# Patient Record
Sex: Female | Born: 1960
Health system: Southern US, Community
[De-identification: ages and names within clinical notes are randomized; demographics above are authoritative.]

## PROBLEM LIST (undated history)

## (undated) ENCOUNTER — Emergency Department (HOSPITAL_COMMUNITY): Admission: EM | Discharge status: Absent without leave | Payer: Self-pay

## (undated) DIAGNOSIS — T4145XA Adverse effect of unspecified anesthetic, initial encounter: Secondary | ICD-10-CM

## (undated) DIAGNOSIS — D219 Benign neoplasm of connective and other soft tissue, unspecified: Secondary | ICD-10-CM

## (undated) DIAGNOSIS — S83207A Unspecified tear of unspecified meniscus, current injury, left knee, initial encounter: Secondary | ICD-10-CM

## (undated) DIAGNOSIS — M1710 Unilateral primary osteoarthritis, unspecified knee: Secondary | ICD-10-CM

## (undated) DIAGNOSIS — R200 Anesthesia of skin: Secondary | ICD-10-CM

## (undated) DIAGNOSIS — M179 Osteoarthritis of knee, unspecified: Secondary | ICD-10-CM

## (undated) HISTORY — PX: TUBAL LIGATION: SHX77

---

## 1994-10-12 HISTORY — PX: DILATION AND CURETTAGE OF UTERUS: SHX78

## 1999-09-09 ENCOUNTER — Emergency Department (HOSPITAL_COMMUNITY): Admission: EM | Admit: 1999-09-09 | Discharge: 1999-09-09 | Payer: Self-pay | Admitting: Emergency Medicine

## 1999-11-01 ENCOUNTER — Emergency Department (HOSPITAL_COMMUNITY): Admission: EM | Admit: 1999-11-01 | Discharge: 1999-11-01 | Payer: Self-pay | Admitting: Emergency Medicine

## 2000-05-11 ENCOUNTER — Ambulatory Visit (HOSPITAL_COMMUNITY): Admission: RE | Admit: 2000-05-11 | Discharge: 2000-05-11 | Payer: Self-pay | Admitting: Obstetrics and Gynecology

## 2000-05-11 ENCOUNTER — Encounter (INDEPENDENT_AMBULATORY_CARE_PROVIDER_SITE_OTHER): Payer: Self-pay

## 2000-05-11 HISTORY — PX: HYSTEROSCOPY WITH D & C: SHX1775

## 2001-04-15 ENCOUNTER — Emergency Department (HOSPITAL_COMMUNITY): Admission: EM | Admit: 2001-04-15 | Discharge: 2001-04-15 | Payer: Self-pay | Admitting: Emergency Medicine

## 2001-11-07 ENCOUNTER — Emergency Department (HOSPITAL_COMMUNITY): Admission: EM | Admit: 2001-11-07 | Discharge: 2001-11-07 | Payer: Self-pay | Admitting: *Deleted

## 2002-09-04 ENCOUNTER — Emergency Department (HOSPITAL_COMMUNITY): Admission: EM | Admit: 2002-09-04 | Discharge: 2002-09-04 | Payer: Self-pay

## 2002-09-11 ENCOUNTER — Ambulatory Visit (HOSPITAL_COMMUNITY): Admission: RE | Admit: 2002-09-11 | Discharge: 2002-09-11 | Payer: Self-pay | Admitting: Family Medicine

## 2002-09-11 ENCOUNTER — Encounter: Payer: Self-pay | Admitting: Family Medicine

## 2003-05-30 ENCOUNTER — Emergency Department (HOSPITAL_COMMUNITY): Admission: EM | Admit: 2003-05-30 | Discharge: 2003-05-30 | Payer: Self-pay | Admitting: Emergency Medicine

## 2003-05-30 ENCOUNTER — Encounter: Payer: Self-pay | Admitting: Emergency Medicine

## 2005-06-05 ENCOUNTER — Emergency Department (HOSPITAL_COMMUNITY): Admission: EM | Admit: 2005-06-05 | Discharge: 2005-06-05 | Payer: Self-pay | Admitting: Family Medicine

## 2007-09-12 ENCOUNTER — Emergency Department (HOSPITAL_COMMUNITY): Admission: EM | Admit: 2007-09-12 | Discharge: 2007-09-12 | Payer: Self-pay | Admitting: Emergency Medicine

## 2008-12-31 ENCOUNTER — Other Ambulatory Visit: Admission: RE | Admit: 2008-12-31 | Discharge: 2008-12-31 | Payer: Self-pay | Admitting: Obstetrics and Gynecology

## 2009-01-01 ENCOUNTER — Ambulatory Visit (HOSPITAL_COMMUNITY): Admission: RE | Admit: 2009-01-01 | Discharge: 2009-01-01 | Payer: Self-pay | Admitting: Obstetrics & Gynecology

## 2009-01-23 ENCOUNTER — Encounter: Admission: RE | Admit: 2009-01-23 | Discharge: 2009-01-23 | Payer: Self-pay | Admitting: Obstetrics & Gynecology

## 2009-02-09 DIAGNOSIS — T8859XA Other complications of anesthesia, initial encounter: Secondary | ICD-10-CM

## 2009-02-09 HISTORY — DX: Other complications of anesthesia, initial encounter: T88.59XA

## 2009-02-26 ENCOUNTER — Encounter: Payer: Self-pay | Admitting: Obstetrics & Gynecology

## 2009-02-26 ENCOUNTER — Ambulatory Visit (HOSPITAL_COMMUNITY): Admission: RE | Admit: 2009-02-26 | Discharge: 2009-02-27 | Payer: Self-pay | Admitting: Obstetrics & Gynecology

## 2009-02-26 HISTORY — PX: ROBOTIC ASSISTED TOTAL HYSTERECTOMY: SHX6085

## 2009-09-03 ENCOUNTER — Emergency Department (HOSPITAL_COMMUNITY): Admission: EM | Admit: 2009-09-03 | Discharge: 2009-09-03 | Payer: Self-pay | Admitting: Emergency Medicine

## 2009-09-03 ENCOUNTER — Emergency Department (HOSPITAL_COMMUNITY): Admission: EM | Admit: 2009-09-03 | Discharge: 2009-09-03 | Payer: Self-pay | Admitting: Family Medicine

## 2009-09-19 ENCOUNTER — Encounter: Admission: RE | Admit: 2009-09-19 | Discharge: 2009-10-09 | Payer: Self-pay | Admitting: Sports Medicine

## 2010-04-07 ENCOUNTER — Encounter: Admission: RE | Admit: 2010-04-07 | Discharge: 2010-04-07 | Payer: Self-pay | Admitting: Obstetrics & Gynecology

## 2010-05-26 ENCOUNTER — Encounter (INDEPENDENT_AMBULATORY_CARE_PROVIDER_SITE_OTHER): Payer: Self-pay | Admitting: Orthopedic Surgery

## 2010-05-26 ENCOUNTER — Inpatient Hospital Stay (HOSPITAL_COMMUNITY): Admission: RE | Admit: 2010-05-26 | Discharge: 2010-05-28 | Payer: Self-pay | Admitting: Orthopedic Surgery

## 2010-05-26 HISTORY — PX: TOTAL KNEE ARTHROPLASTY: SHX125

## 2010-06-25 ENCOUNTER — Encounter
Admission: RE | Admit: 2010-06-25 | Discharge: 2010-08-05 | Payer: Self-pay | Source: Home / Self Care | Admitting: Orthopedic Surgery

## 2010-11-02 ENCOUNTER — Encounter: Payer: Self-pay | Admitting: Obstetrics & Gynecology

## 2010-12-26 LAB — DIFFERENTIAL
Eosinophils Relative: 1 % (ref 0–5)
Lymphocytes Relative: 31 % (ref 12–46)
Monocytes Relative: 7 % (ref 3–12)
Neutro Abs: 2.9 10*3/uL (ref 1.7–7.7)
Neutrophils Relative %: 61 % (ref 43–77)

## 2010-12-26 LAB — COMPREHENSIVE METABOLIC PANEL
Albumin: 4.1 g/dL (ref 3.5–5.2)
Alkaline Phosphatase: 88 U/L (ref 39–117)
CO2: 25 mEq/L (ref 19–32)
GFR calc non Af Amer: >60 mL/min (ref >60)
Potassium: 3.9 mEq/L (ref 3.5–5.1)
Sodium: 141 mEq/L (ref 135–145)
Total Bilirubin: 0.6 mg/dL (ref 0.3–1.2)

## 2010-12-26 LAB — URINALYSIS, ROUTINE W REFLEX MICROSCOPIC
Glucose, UA: NEGATIVE mg/dL
Protein, ur: NEGATIVE mg/dL
Specific Gravity, Urine: 1.024 (ref 1.005–1.030)
pH: 6 (ref 5.0–8.0)

## 2010-12-26 LAB — CBC
HCT: 32.5 % — ABNORMAL LOW (ref 36.0–46.0)
Hemoglobin: 10.5 g/dL — ABNORMAL LOW (ref 12.0–15.0)
Hemoglobin: 11 g/dL — ABNORMAL LOW (ref 12.0–15.0)
MCH: 34.5 pg — ABNORMAL HIGH (ref 26.0–34.0)
MCH: 34.5 pg — ABNORMAL HIGH (ref 26.0–34.0)
MCH: 34.4 pg — ABNORMAL HIGH (ref 26.0–34.0)
MCHC: 33.8 g/dL (ref 30.0–36.0)
MCV: 102.3 fL — ABNORMAL HIGH (ref 78.0–100.0)
Platelets: 128 10*3/uL — ABNORMAL LOW (ref 150–400)
RBC: 3.19 MIL/uL — ABNORMAL LOW (ref 3.87–5.11)
RDW: 12.7 % (ref 11.5–15.5)
RDW: 12.5 % (ref 11.5–15.5)
WBC: 7.3 10*3/uL (ref 4.0–10.5)
WBC: 7.7 10*3/uL (ref 4.0–10.5)

## 2010-12-26 LAB — BASIC METABOLIC PANEL
CO2: 28 mEq/L (ref 19–32)
Calcium: 8.6 mg/dL (ref 8.4–10.5)
Chloride: 101 mEq/L (ref 96–112)
Creatinine, Ser: 0.58 mg/dL (ref 0.4–1.2)
GFR calc Af Amer: >60 mL/min (ref >60)
GFR calc non Af Amer: >60 mL/min (ref >60)
Glucose, Bld: 96 mg/dL (ref 70–99)
Sodium: 136 mEq/L (ref 135–145)
Sodium: 140 mEq/L (ref 135–145)

## 2010-12-26 LAB — TYPE AND SCREEN: Antibody Screen: NEGATIVE

## 2010-12-26 LAB — PROTIME-INR: INR: 1.01 (ref 0.00–1.49)

## 2010-12-26 LAB — APTT: aPTT: 32 seconds (ref 24–37)

## 2010-12-26 LAB — SURGICAL PCR SCREEN: Staphylococcus aureus: NEGATIVE

## 2011-01-20 LAB — BASIC METABOLIC PANEL
CO2: 25 mEq/L (ref 19–32)
Calcium: 8.5 mg/dL (ref 8.4–10.5)
GFR calc Af Amer: >60 mL/min (ref >60)
Potassium: 3.5 mEq/L (ref 3.5–5.1)
Potassium: 3.9 mEq/L (ref 3.5–5.1)
Sodium: 139 mEq/L (ref 135–145)

## 2011-01-20 LAB — URINALYSIS, ROUTINE W REFLEX MICROSCOPIC
Hgb urine dipstick: NEGATIVE
Nitrite: NEGATIVE
Specific Gravity, Urine: 1.008 (ref 1.005–1.030)

## 2011-01-20 LAB — CBC
HCT: 27.1 % — ABNORMAL LOW (ref 36.0–46.0)
Hemoglobin: 9.1 g/dL — ABNORMAL LOW (ref 12.0–15.0)
MCHC: 33.8 g/dL (ref 30.0–36.0)
MCV: 89.7 fL (ref 78.0–100.0)
MCV: 90.9 fL (ref 78.0–100.0)
Platelets: 166 K/uL (ref 150–400)
Platelets: 205 10*3/uL (ref 150–400)
RBC: 2.98 MIL/uL — ABNORMAL LOW (ref 3.87–5.11)
RDW: 15.8 % — ABNORMAL HIGH (ref 11.5–15.5)
WBC: 7.7 K/uL (ref 4.0–10.5)

## 2011-01-20 LAB — PREGNANCY, URINE: Preg Test, Ur: NEGATIVE

## 2011-01-20 LAB — TSH: TSH: 0.358 u[IU]/mL (ref 0.350–4.500)

## 2011-01-20 LAB — TYPE AND SCREEN: Antibody Screen: NEGATIVE

## 2011-01-20 LAB — ABO/RH: ABO/RH(D): O POS

## 2011-02-24 NOTE — Op Note (Signed)
[name], [name]                  ACCOUNT NO.:  [id number]   MEDICAL RECORD NO.:  [id number]          PATIENT TYPE:  OIB   LOCATION:  0098                         FACILITY:  [facility]   PHYSICIAN:  M. Leda Quail, MD  DATE OF BIRTH:  [birth year]   DATE OF PROCEDURE:  02/26/2009  DATE OF DISCHARGE:                               OPERATIVE REPORT   PREOPERATIVE DIAGNOSES:  16. A 50 year old gravida 2, para 2, married single [place] American      female with menorrhagia.  2. Anemia.  3. Fibroid uterus.  4. Adenomyosis on ultrasound.  5. Cesarean section x1.  6. History of bilateral tubal ligation.  7. Smoker.   POSTOPERATIVE DIAGNOSES:  69. A 50 year old gravida 2, para 2, married single [place] American      female with menorrhagia.  2. Anemia.  3. Fibroid uterus.  4. Adenomyosis on ultrasound.  5. Cesarean section x1.  6. History of bilateral tubal ligation.  7. Smoker.   PROCEDURE:  TLH using Chemical engineer system.   SURGEON:  M. Leda Quail, M.D.   ASSISTANT:  Edwena Felty. Romine, M.D.   ANESTHESIA:  General endotracheal, Bruce J. Council Mechanic, M.D. oversaw the  case.   FINDINGS:  Globular uterus about 10 weeks in size consistent with  fibroids and adenomyosis.  Small left paratubal cyst.  The patient had a  4-cm simple-appearing cyst on the right ovary which was absent.  She did  have some adhesions of the ovary to the right sidewall.  Also, the  patient has a simple approximately 2-cm cyst on the left sidewall.   SPECIMENS:  Uterus and cervix sent to pathology.   ESTIMATED BLOOD LOSS:  100 mL.   URINE OUTPUT:  200 mL of clear urine.   DRAINS:  Foley catheter.   FLUIDS:  1300 mL of LR.   COMPLICATIONS:  None.   INDICATIONS:  Ms. [name] is a very nice 50 year old G2, P2 African  American female with a history of significant menorrhagia and anemia.  This is interfering with work and she misses work almost every month  because of the amount of bleeding she has.   She had ruined clothes as  well as sheets and mattress because of the volume of bleeding.  She is a  smoker and is not a candidate for birth control pills.  We discussed  other options including an ablation, which I think would be a bad option  for this patient because of her adenomyosis, and also an IUD, which she  declined.  Risks and benefits have been explained to the patient and  this is all documented in her office chart.  She is ready to proceed.   PROCEDURE:  The patient is taken to the operating room and she is placed  in a supine position.  General endotracheal anesthesia is administered  by the anesthesia staff without difficulty.  The patient's arms were  tucked straight to her side, with arms being padded and fingers being  placed loosely.  Attention was paid to where the [facility] stirrups are  connected to the table  and the placement of her hands.  Legs were then  positioned in the low lithotomy position in [place] stirrups.  The legs  were then lifted.  The abdomen, perineum and her thighs and vagina are  prepped in normal sterile fashion.  The patient is then draped in a  normal sterile fashion.  Attention is turned toward the vagina.  A heavy  weighted speculum was placed in the posterior vaginal opening.  A Deaver  retractor was used to visualize the cervix and the anterior lip of the  cervix was grasped with a single-tooth tenaculum.  The cervix is dilated  to a #21 using Pratt dilators and sounded to 8 cm.  A #8 attachment tip  for the RUMI uterine manipulator was obtained.  This is attached to the  RUMI uterine manipulator as well as the vaginal occlusive device.  The  uterine manipulator is passed into the cervical canal to the fundus of  the uterus.  A medium KOH ring is also attached to the RUMI uterine  manipulator and placed around the cervix.  Normal saline 5 mL are used  to inflate the bulb.  Tenaculum was removed from the cervix.  Good  placement around the cervix  was noted.  There was good manipulation of  the uterus.  The heavy weighted speculum is removed from the vagina.  The vaginal occlusive device was inflated with 60 mL of air.  The legs  were then flexed to the lowest possible position in low lithotomy  position.  Sterile gloves and gown are changed and attention turned to  the abdomen.  Three to 4 cm above the umbilicus skin is anesthetized.  Quarter percent Marcaine is used to anesthetize the skin.  Using a #11  blade, a 10-mm skin incision was made.  Subcutaneous fat tissue was  dissected with a hemostat.  The fascia is identified and visualized  using S retractors.  Kocher clamps were applied to the fascia and the  fascia is elevated.  Using curved Mayo scissors, an incision is made in  the fascia.  By using the operator's index finger, the peritoneum was  popped through.  There were some adhesions of the omentum to the  anterior abdominal wall below the incision, but otherwise the abdomen  feels clear.  A pursestring suture of #2-0 Vicryl was placed at the  umbilicus.  Then a #10 Roseanne Reno is passed through the umbilicus.  It is  attached to the skin using the suture ends from the pursestring suture.  CO2 gas was then used to achieve pneumoperitoneum under low flow.  The  laparoscope for the robot is used to then visualize the pelvis.  The  table is placed on the floor.  The patient was placed in deep  Trendelenburg.  She tolerated this very well.  Pelvis was surveyed.  The  scope could be passed easily around the omental adhesion.  Decision was  made to go ahead and proceed with the case as planned.  Port sites for  the first and second arm of the [employer] robot were identified.  These  were placed about a hand breadth away from the umbilical incision.  The  areas were marked and placement site for the assistant port was then  made about 10 cm to the right and inferiorly from the first arm port.  The third arm of the robot was not used  for this case.  The skin was  anesthetized in all these locations using 0.25%  Marcaine and then using  #11 blade the skin was nicked in all 3 locations.  Non-disposable ports  were placed in the higher ports and a 12-mm disposable port was placed  in the right inferior port.  At this point the robot was docked from the  side without difficulty.  Monopolar scissors were used in the right hand  and bipolar cautery was used in the left hand.   With an OR nurse between the legs manipulating the uterus, pelvis was  surveyed.  There was a simple cyst on the left ovary, a paratubal cyst  on the left tube, and the right ovary was adhesed slightly to the right  sidewall.  Ureter peristalsis was noted bilaterally.  Then attention was  initially turned to the left side.  The left ovarian cyst was opened and  drained without difficulty.  Then the left utero-ovarian pedicle was  serially clamped, cauterized and transected using bipolar cautery and  the sound indicator to ensure adequate cauterization.  Then the left  round ligament was serially clamped, cauterized and transected until it  was opened.  Then the anterior and posterior leaf of the broad ligament  was opened with monopolar scissors.  The peritoneum was incised  inferiorly to the level of the internal os of the cervix creating the  beginning of the bladder flap.  The left uterine artery was  skeletonized.  Attention is turned to the right side.  The right utero-  ovarian pedicle was clamped, cauterized and incised, and the right round  ligament was clamped, cauterized and incised.  Then the anterior and  posterior leaf of the broad ligament was opened anteriorly and  posteriorly with the  monopolar scissors.  The peritoneum was connected  inferiorly to the peritoneal incision that had previously been made.  The peritoneum was then elevated and the pubovesical cervical fascia was  identified.  This filmy layer was taken down sharply,  cauterization used  only when small vessels were noted.  Glistening white tissue of the  cervix was noted and the level of the KOH ring was identified.  The  pubovesical cervical fascia was dissected down until it was below the  level of the KOH ring.  Then attention was turned to the right uterine  artery.  This was skeletonized and then serially clamped, cauterized and  incised starting at the level of the internal os and then coming down  the cervix until the pedicle was beneath the KOH ring.  Attention was  turned to the left side and the uterine artery was serially clamped,  cauterized and incised, again until it was below the level of the KOH  ring.  Then the colpotomy was started anteriorly on the KOH ring using  monopolar scissors.  This was brought around the cervix to the right  side, then posteriorly, and then completing the colpotomy on the left.  Once the cervix and uterus were completely freed from the vaginal  mucosa, the vaginal occlusive device was deflated and the uterus was  brought through the incision.  It was used to keep the vagina occluded  during the closure of the vaginal cuff.  Then using a 0 Vicryl, 4 figure-  of-eight sutures were used to close the vaginal cuff, attaching the  anterior peritoneum, anterior vaginal mucosa, posterior vaginal mucosa  and posterior peritoneum.  Once this was completed, Nezhat suction  irrigator was used to irrigate the pelvis.  The suture cut device and  needle driver were used  in the first and second arm to close the vaginal  cuff.  At this point all pedicles inspected.  No bleeding was noted.  The instruments were removed from the pelvis.  The robot was undocked.  The patient was brought back to the supine position and the  pneumoperitoneum was relieved.  The ports were removed under direct  visualization of the laparoscope without any bleeding being noted.  The  anesthesiologist gave the patient several deep breaths in an  attempt to  remove all the air from the abdomen.  Once this was complete, the  supraumbilical port was removed.  The fascia was elevated and the  incision was tied by closing the incision with a pursestring suture.  The right 12-mm port incision was closed at the fascial level with  figure-of-eight suture of 0 Vicryl.  Then the skin was cleansed.  Hemostasis was achieved with the Bovie.  Incision above the umbilicus  was closed with subcuticular stitch of 3-0 Vicryl and the 12-mm incision  was closed with a subcuticular stitch of 3-0 Vicryl.  Incisions were  cleansed and Dermabond was used to close the 2 smaller incisions and  make the 2 sutured incisions watertight.  At this point, the uterus was  removed from the vagina and a clean sponge stick was used to dab the  vagina.  There was minimal blood that was noted.  The patient tolerated  the procedure well.  She was positioned back in the supine position.  Her hands were watched during this end-portion of the procedure and  there were no issues noted.  She was awakened from anesthesia and  extubated without difficulty.  Thirty mg of IV Toradol was given to the  patient.  She was taken to the recovery room in stable addition.      Lum Keas, MD  Electronically Signed     MSM/MEDQ  D:  02/26/2009  T:  02/26/2009  Job:  [id number]

## 2011-02-27 NOTE — Op Note (Signed)
[facility]  Patient:    [name]                          MRN: [id number] Proc. Date: 05/11/00 Adm. Date:  [id number] Attending:  Amanda Cockayne                           Operative Report  PREOPERATIVE DIAGNOSIS:  POSTOPERATIVE DIAGNOSIS:  OPERATION:  SURGEON:                      Esmeralda Arthur, M.D.  ASSISTANT:  ANESTHESIA:                   General anesthesia.  PACKS:                        None.  _____________________:       80 cc.  CATHETERS:                    None.  DESCRIPTION OF PROCEDURE:     The patient was taken to the operating room and under satisfactory general anesthesia, placed in the lithotomy position and prepped and draped in sterile fashion, ____________ catheterization.  Weighted speculum was placed in the posterior vagina.  Cervix grasped with the tenaculum and the uterus was dilated to a #23.  Hegar and the observation scope were placed in.  The patient had a big uterus which sounded to 8 to 9 cm.  We could see either a lot of hyperplasia or multiple polyps in the endometrial cavity.  We then withdrew the scope, explored with Randall stone forceps, got a lot of tissue which looked like a polyp.  We looked again. She still had some areas and we grasped these and then took the sharp curette and curetted with a wide amount of tissue obtained.  We then used a serrated curette.  We looked again with the scope. She had something on the anterior wall and we scraped this and I think this was at the polyp.  In looking the last time, she had no what looked like polyps for fibroids.  The procedure was terminated.  The patient was taken to the recovery room in good condition. DD:  05/11/00 TD:  05/12/00 Job: [id number]

## 2011-02-27 NOTE — H&P (Signed)
[facility]  Patient:    [name], [name]                           MRN: [id number] Adm. Date:  05/10/00 Attending:  Esmeralda Arthur, M.D.                         History and Physical  HISTORY OF PRESENT ILLNESS:       This is a 50 year old female para 2-0-2 who is admitted to the hospital for a D&C and hysteroscopy. The patient was seen as a new patient on May 04, 2000 stating that she had been bleeding for a month and she had had irregular bleeding for 7 months with clots and severe cramps. The patient said she has always had cramps but not as much as she has had in the past 3 or 4 months. The patient states her periods last 8-10 days and she wears a tampon and a pad at the same time and she goes through about 25 tampons and pads in a cycle period.  She was seen in the emergency room at [facility] the night before with this history and they examined her. The patient has had a tubal ligation before. Her last Pap smear was years ago and her last mammogram was years ago. The patient works at [employer]. She takes Midol for cramps which does not seem to help. She has no calcium intake.  SOCIAL HISTORY:               She smokes a pack a day. She uses alcohol about 2 beers a day. She has little exercise. She states her nutrition is good. She states she looses water sometimes when she coughs or sneezes.  REVIEW OF SYSTEMS:            HEENT:  Negative.  HEART:  Negative. LUNGS: Negative. GI:  Negative. GU:  Negative. Her complaints are her menstrual periods.  PAST PERSONAL HISTORY:        She has had no problems except she had some joint pain which she attributes to arthritis.  FAMILY HISTORY:               She has no history of cancer, heart disease or high blood pressure. She states her mother died of liver disease and hypertension. She does not know about her father.  PAST SURGICAL HISTORY:        She had a tubal ligation. She had a D&C 4 or 5 years  ago.  PHYSICAL EXAMINATION:  GENERAL:                      Reveals a well-developed, well-nourished female oriented and alert.  VITAL SIGNS:                  Blood pressure 130/88, weight is 221, height is 5 feet 2 inches.  NECK:                         Her thyroid is not palpated.  LUNGS:                        Clear to auscultation and percussion.  HEART:                        Normal  sinus rhythm.  BREASTS:                      Type 4 without masses.  ABDOMEN:                      The liver is not enlarged, the spleen is not enlarged.  PELVIC:                       Vagina, she has a slight cystocele. Rectocele is within normal limits. The cervix is epithelialized. The uterus is not felt and I cant really evaluate it. I think it is enlarged but cant really feel it on exam and cant feel her ovaries so cant tell. The patient is to have an ultrasound before surgery.  RECTAL:  Poor quality sphincter tone.  The patient has had 2 babies, one 8 pounds 15 ounces and the other one was greater than 8.  IMPRESSION:                   Irregular bleeding for 5-6 months, heavy bleeding for 2-3 months. Questionable enlarged uterus with fibroids. Post tubal ligation. High risk of diabetes.  DISPOSITION:                  Admit for hysteroscopy and D&C. DD:  05/10/00 TD:  05/10/00 Job: [id number]

## 2011-08-09 ENCOUNTER — Inpatient Hospital Stay (INDEPENDENT_AMBULATORY_CARE_PROVIDER_SITE_OTHER)
Admission: RE | Admit: 2011-08-09 | Discharge: 2011-08-09 | Disposition: A | Discharge status: Home / Self Care | Payer: 59 | Source: Ambulatory Visit | Attending: Family Medicine | Admitting: Family Medicine

## 2011-08-09 DIAGNOSIS — B029 Zoster without complications: Secondary | ICD-10-CM

## 2012-11-26 ENCOUNTER — Other Ambulatory Visit: Payer: Self-pay

## 2013-02-07 ENCOUNTER — Ambulatory Visit (INDEPENDENT_AMBULATORY_CARE_PROVIDER_SITE_OTHER): Payer: 59 | Admitting: Family Medicine

## 2013-02-07 VITALS — BP 144/92 | HR 73 | Temp 97.2°F | Resp 20 | Ht 62.0 in | Wt 171.0 lb

## 2013-02-07 DIAGNOSIS — A0811 Acute gastroenteropathy due to Norwalk agent: Secondary | ICD-10-CM

## 2013-02-07 DIAGNOSIS — R112 Nausea with vomiting, unspecified: Secondary | ICD-10-CM

## 2013-02-07 DIAGNOSIS — R197 Diarrhea, unspecified: Secondary | ICD-10-CM

## 2013-02-07 LAB — POCT CBC
Granulocyte percent: 92.1 %G — AB (ref 37–80)
Hemoglobin: 14.7 g/dL (ref 12.2–16.2)
Lymph, poc: 0.5 — AB (ref 0.6–3.4)
MCHC: 32.3 g/dL (ref 31.8–35.4)
MPV: 10.1 fL (ref 0–99.8)
POC Granulocyte: 7.6 — AB (ref 2–6.9)
POC LYMPH PERCENT: 6.5 %L — AB (ref 10–50)
POC MID %: 1.4 %M (ref 0–12)
RDW, POC: 13.2 %

## 2013-02-07 MED ORDER — SODIUM CHLORIDE 0.9 % IV SOLN
4.0000 mg | Freq: Once | INTRAVENOUS | Status: AC
  Administered 2013-02-07: 4 mg via INTRAVENOUS

## 2013-02-07 MED ORDER — ONDANSETRON 4 MG PO TBDP: 4.0000 mg | ORAL_TABLET | Freq: Three times a day (TID) | ORAL | Status: DC | PRN

## 2013-02-07 MED ORDER — ONDANSETRON 4 MG PO TBDP: ORAL_TABLET | ORAL | Status: DC

## 2013-02-07 NOTE — Progress Notes (Signed)
Subjective:  This morning the patient developed profuse vomiting and diarrhea. Should her bowels move at least 5 times and she vomited at least 3 times. She's having generalized abdominal pains. Especially in the low abdomen. She did have lightheadedness, had to use a cane or walker. She cleans in the emergency room and thinks she picked it up there.  Objective: Ill-appearing lady. Her TMs are normal. Throat clear. Neck supple without nodes. Chest clear. Heart mildly tach. And soft masses. She is tender across lower abdomen. Bowel sounds were very diminished. Extremities normal. Skin normal.  Assessment: Vomiting Diarrhea Gastroenteritis, probably norovirus  Plan: Patient was given 1 L of IV fluid. She was feeling a little better though still hurting in her abdomen. She was given Zofran also. Her CBC was consistent with a viral process. Cautioned her to return to the ER or come here if in all worse. We do not want to miss a surgical abdomen.   Results for orders placed in visit on 02/07/13  POCT CBC      Result Value Range   WBC 8.2  4.6 - 10.2 K/uL   Lymph, poc 0.5 (*) 0.6 - 3.4   POC LYMPH PERCENT 6.5 (*) 10 - 50 %L   MID (cbc) 0.1  0 - 0.9   POC MID % 1.4  0 - 12 %M   POC Granulocyte 7.6 (*) 2 - 6.9   Granulocyte percent 92.1 (*) 37 - 80 %G   RBC 4.49  4.04 - 5.48 M/uL   Hemoglobin 14.7  12.2 - 16.2 g/dL   HCT, POC 16.1  09.6 - 47.9 %   MCV 101.4 (*) 80 - 97 fL   MCH, POC 32.7 (*) 27 - 31.2 pg   MCHC 32.3  31.8 - 35.4 g/dL   RDW, POC 04.5     Platelet Count, POC 185  142 - 424 K/uL   MPV 10.1  0 - 99.8 fL

## 2013-02-07 NOTE — Patient Instructions (Signed)
Takes Zofran every 6 hours as needed for vomiting  Take OTC Imodium per instructions on the medicine as needed for diarrhea  Drink plenty of fluids. When your stomach is calm you can start eating some dry crackers or toast and gradually progress her diet  Avoid fried and fatty foods  Norovirus Infection Norovirus illness is caused by a viral infection. The term norovirus refers to a group of viruses. Any of those viruses can cause norovirus illness. This illness is often referred to by other names such as viral gastroenteritis, stomach flu, and food poisoning. Anyone can get a norovirus infection. People can have the illness multiple times during their lifetime. CAUSES  Norovirus is found in the stool or vomit of infected people. It is easily spread from person to person (contagious). People with norovirus are contagious from the moment they begin feeling ill. They may remain contagious for as long as 3 days to 2 weeks after recovery. People can become infected with the virus in several ways. This includes:  Eating food or drinking liquids that are contaminated with norovirus.  Touching surfaces or objects contaminated with norovirus, and then placing your hand in your mouth.  Having direct contact with a person who is infected and shows symptoms. This may occur while caring for someone with illness or while sharing foods or eating utensils with someone who is ill. SYMPTOMS  Symptoms usually begin 1 to 2 days after ingestion of the virus. Symptoms may include:  Nausea.  Vomiting.  Diarrhea.  Stomach cramps.  Low-grade fever.  Chills.  Headache.  Muscle aches.  Tiredness. Most people with norovirus illness get better within 1 to 2 days. Some people become dehydrated because they cannot drink enough liquids to replace those lost from vomiting and diarrhea. This is especially true for young children, the elderly, and others who are unable to care for themselves. DIAGNOSIS   Diagnosis is based on your symptoms and exam. Currently, only state public health laboratories have the ability to test for norovirus in stool or vomit. TREATMENT  No specific treatment exists for norovirus infections. No vaccine is available to prevent infections. Norovirus illness is usually brief in healthy people. If you are ill with vomiting and diarrhea, you should drink enough water and fluids to keep your urine clear or pale yellow. Dehydration is the most serious health effect that can result from this infection. By drinking oral rehydration solution (ORS), people can reduce their chance of becoming dehydrated. There are many commercially available pre-made and powdered ORS designed to safely rehydrate people. These may be recommended by your caregiver. Replace any new fluid losses from diarrhea or vomiting with ORS as follows:  If your child weighs 10 kg or less (22 lb or less), give 60 to 120 ml ( to  cup or 2 to 4 oz) of ORS for each diarrheal stool or vomiting episode.  If your child weighs more than 10 kg (more than 22 lb), give 120 to 240 ml ( to 1 cup or 4 to 8 oz) of ORS for each diarrheal stool or vomiting episode. HOME CARE INSTRUCTIONS   Follow all your caregiver's instructions.  Avoid sugar-free and alcoholic drinks while ill.  Only take over-the-counter or prescription medicines for pain, vomiting, diarrhea, or fever as directed by your caregiver. You can decrease your chances of coming in contact with norovirus or spreading it by following these steps:  Frequently wash your hands, especially after using the toilet, changing diapers, and before eating or  preparing food.  Carefully wash fruits and vegetables. Cook shellfish before eating them.  Do not prepare food for others while you are infected and for at least 3 days after recovering from illness.  Thoroughly clean and disinfect contaminated surfaces immediately after an episode of illness using a bleach-based  household cleaner.  Immediately remove and wash clothing or linens that may be contaminated with the virus.  Use the toilet to dispose of any vomit or stool. Make sure the surrounding area is kept clean.  Food that may have been contaminated by an ill person should be discarded. SEEK IMMEDIATE MEDICAL CARE IF:   You develop symptoms of dehydration that do not improve with fluid replacement. This may include:  Excessive sleepiness.  Lack of tears.  Dry mouth.  Dizziness when standing.  Weak pulse. Document Released: 12/19/2002 Document Revised: 12/21/2011 Document Reviewed: 01/20/2010 [facility] Patient Information 2013 [place], [place].

## 2013-08-17 ENCOUNTER — Other Ambulatory Visit: Payer: Self-pay

## 2014-05-19 ENCOUNTER — Ambulatory Visit (INDEPENDENT_AMBULATORY_CARE_PROVIDER_SITE_OTHER): Payer: 59 | Admitting: Emergency Medicine

## 2014-05-19 ENCOUNTER — Ambulatory Visit (INDEPENDENT_AMBULATORY_CARE_PROVIDER_SITE_OTHER): Payer: 59

## 2014-05-19 VITALS — BP 110/88 | HR 75 | Temp 97.8°F | Resp 16 | Ht 63.0 in | Wt 177.2 lb

## 2014-05-19 DIAGNOSIS — M25562 Pain in left knee: Secondary | ICD-10-CM

## 2014-05-19 DIAGNOSIS — M1710 Unilateral primary osteoarthritis, unspecified knee: Secondary | ICD-10-CM

## 2014-05-19 DIAGNOSIS — M25569 Pain in unspecified knee: Secondary | ICD-10-CM

## 2014-05-19 DIAGNOSIS — M1712 Unilateral primary osteoarthritis, left knee: Secondary | ICD-10-CM

## 2014-05-19 LAB — POCT CBC
GRANULOCYTE PERCENT: 59.7 % (ref 37–80)
HEMATOCRIT: 40.6 % (ref 37.7–47.9)
Hemoglobin: 13.2 g/dL (ref 12.2–16.2)
Lymph, poc: 1.7 (ref 0.6–3.4)
MCH, POC: 33.3 pg — AB (ref 27–31.2)
MCHC: 32.5 g/dL (ref 31.8–35.4)
MCV: 102.3 fL — AB (ref 80–97)
MID (CBC): 0.4 (ref 0–0.9)
MPV: 8.4 fL (ref 0–99.8)
PLATELET COUNT, POC: 161 10*3/uL (ref 142–424)
POC GRANULOCYTE: 3.2 (ref 2–6.9)
POC LYMPH %: 32.7 % (ref 10–50)
POC MID %: 7.6 % (ref 0–12)
RBC: 3.96 M/uL — AB (ref 4.04–5.48)
RDW, POC: 13 %
WBC: 5.3 10*3/uL (ref 4.6–10.2)

## 2014-05-19 MED ORDER — NAPROXEN SODIUM 550 MG PO TABS: 550.0000 mg | ORAL_TABLET | Freq: Two times a day (BID) | ORAL | Status: DC

## 2014-05-19 NOTE — Patient Instructions (Signed)

## 2014-05-19 NOTE — Progress Notes (Signed)
[facility] [address] [place] [address] [phone]  Date:  05/19/2014   Name:  [name]   DOB:  10-27-1960   MRN:  [id number]  PCP:  No primary provider on file.    Chief Complaint: Knee Pain   History of Present Illness:  [name] is a 53 y.o. very pleasant female patient who presents with the following:  Patient woke up this morning with a left knee effusion and pain.  No history of injury or overuse.  No fever or chills. History of right knee replacement.  Not able to walk. No history of gout or inflammatory joint disorders. No improvement with over the counter medications or other home remedies. Denies other complaint or health concern today.   There are no active problems to display for this patient.   Past Medical History  Diagnosis Date  . Arthritis     Past Surgical History  Procedure Laterality Date  . Total knee arthroplasty Right     History  Substance Use Topics  . Smoking status: Current Every Day Smoker -- 0.50 packs/day    Types: Cigarettes  . Smokeless tobacco: Not on file  . Alcohol Use: Yes     Comment: social use    No family history on file.  No Known Allergies  Medication list has been reviewed and updated.  No current outpatient prescriptions on file prior to visit.   No current facility-administered medications on file prior to visit.    Review of Systems:  As per HPI, otherwise negative.    Physical Examination: Filed Vitals:   05/19/14 1527  BP: 110/88  Pulse: 75  Temp: 97.8 F (36.6 C)  Resp: 16   Filed Vitals:   05/19/14 1527  Height: 5\' 3"  (1.6 m)  Weight: 177 lb 3.2 oz (80.377 kg)   Body mass index is 31.4 kg/(m^2). Ideal Body Weight: Weight in (lb) to have BMI = 25: 140.8   GEN: WDWN, NAD, Non-toxic, Alert & Oriented x 3 HEENT: Atraumatic, Normocephalic.  Ears and Nose: No external deformity. EXTR: No clubbing/cyanosis/edema NEURO: assisted gait.  PSYCH: Normally  interactive. Conversant. Not depressed or anxious appearing.  Calm demeanor.  LEFT knee full extension can't flex to 90 degrees.  Some effusion.  Joint stable.  No cellulitis.  Guards.  Assessment and Plan: Acute knee effusion DJD Crutches  meds Elevate   Signed,  Ellison Carwin, MD   [facility] reading (PRIMARY) by  Dr. Ouida Sills.  No acute injury.  DJD.  effusion.

## 2014-05-20 LAB — SEDIMENTATION RATE: SED RATE: 6 mm/h (ref 0–22)

## 2014-05-23 ENCOUNTER — Emergency Department (INDEPENDENT_AMBULATORY_CARE_PROVIDER_SITE_OTHER): Payer: Worker's Compensation

## 2014-05-23 ENCOUNTER — Encounter (HOSPITAL_COMMUNITY): Payer: Self-pay | Admitting: Emergency Medicine

## 2014-05-23 ENCOUNTER — Emergency Department (INDEPENDENT_AMBULATORY_CARE_PROVIDER_SITE_OTHER)
Admission: EM | Admit: 2014-05-23 | Discharge: 2014-05-23 | Disposition: A | Discharge status: Home / Self Care | Payer: Worker's Compensation | Source: Home / Self Care | Attending: Family Medicine | Admitting: Family Medicine

## 2014-05-23 DIAGNOSIS — M25562 Pain in left knee: Secondary | ICD-10-CM

## 2014-05-23 DIAGNOSIS — Y9210 Unspecified residential institution as the place of occurrence of the external cause: Secondary | ICD-10-CM

## 2014-05-23 DIAGNOSIS — M25569 Pain in unspecified knee: Secondary | ICD-10-CM

## 2014-05-23 DIAGNOSIS — W010XXA Fall on same level from slipping, tripping and stumbling without subsequent striking against object, initial encounter: Secondary | ICD-10-CM

## 2014-05-23 DIAGNOSIS — W19XXXA Unspecified fall, initial encounter: Secondary | ICD-10-CM

## 2014-05-23 DIAGNOSIS — Y990 Civilian activity done for income or pay: Secondary | ICD-10-CM

## 2014-05-23 DIAGNOSIS — T1490XA Injury, unspecified, initial encounter: Secondary | ICD-10-CM

## 2014-05-23 MED ORDER — DICLOFENAC SODIUM 50 MG PO TBEC: 50.0000 mg | DELAYED_RELEASE_TABLET | Freq: Two times a day (BID) | ORAL | Status: DC | PRN

## 2014-05-23 NOTE — Discharge Instructions (Signed)
Thank you for coming in today. Followup with the occupational health clinic as soon as possible  Take diclofenac as needed  Arthralgia Your caregiver has diagnosed you as suffering from an arthralgia. Arthralgia means there is pain in a joint. This can come from many reasons including:  Bruising the joint which causes soreness (inflammation) in the joint.  Wear and tear on the joints which occur as we grow older (osteoarthritis).  Overusing the joint.  Various forms of arthritis.  Infections of the joint. Regardless of the cause of pain in your joint, most of these different pains respond to anti-inflammatory drugs and rest. The exception to this is when a joint is infected, and these cases are treated with antibiotics, if it is a bacterial infection. HOME CARE INSTRUCTIONS   Rest the injured area for as long as directed by your caregiver. Then slowly start using the joint as directed by your caregiver and as the pain allows. Crutches as directed may be useful if the ankles, knees or hips are involved. If the knee was splinted or casted, continue use and care as directed. If an stretchy or elastic wrapping bandage has been applied today, it should be removed and re-applied every 3 to 4 hours. It should not be applied tightly, but firmly enough to keep swelling down. Watch toes and feet for swelling, bluish discoloration, coldness, numbness or excessive pain. If any of these problems (symptoms) occur, remove the ace bandage and re-apply more loosely. If these symptoms persist, contact your caregiver or return to this location.  For the first 24 hours, keep the injured extremity elevated on pillows while lying down.  Apply ice for 15-20 minutes to the sore joint every couple hours while awake for the first half day. Then 03-04 times per day for the first 48 hours. Put the ice in a plastic bag and place a towel between the bag of ice and your skin.  Wear any splinting, casting, elastic bandage  applications, or slings as instructed.  Only take over-the-counter or prescription medicines for pain, discomfort, or fever as directed by your caregiver. Do not use aspirin immediately after the injury unless instructed by your physician. Aspirin can cause increased bleeding and bruising of the tissues.  If you were given crutches, continue to use them as instructed and do not resume weight bearing on the sore joint until instructed. Persistent pain and inability to use the sore joint as directed for more than 2 to 3 days are warning signs indicating that you should see a caregiver for a follow-up visit as soon as possible. Initially, a hairline fracture (break in bone) may not be evident on X-rays. Persistent pain and swelling indicate that further evaluation, non-weight bearing or use of the joint (use of crutches or slings as instructed), or further X-rays are indicated. X-rays may sometimes not show a small fracture until a week or 10 days later. Make a follow-up appointment with your own caregiver or one to whom we have referred you. A radiologist (specialist in reading X-rays) may read your X-rays. Make sure you know how you are to obtain your X-ray results. Do not assume everything is normal if you do not hear from [place]. SEEK MEDICAL CARE IF: Bruising, swelling, or pain increases. SEEK IMMEDIATE MEDICAL CARE IF:   Your fingers or toes are numb or blue.  The pain is not responding to medications and continues to stay the same or get worse.  The pain in your joint becomes severe.  You  develop a fever over 102 F (38.9 C).  It becomes impossible to move or use the joint. MAKE SURE YOU:   Understand these instructions.  Will watch your condition.  Will get help right away if you are not doing well or get worse. Document Released: 09/28/2005 Document Revised: 12/21/2011 Document Reviewed: 05/16/2008 [facility] Patient Information 2015 [place], [place]. This information is not intended to  replace advice given to you by your health care provider. Make sure you discuss any questions you have with your health care provider.

## 2014-05-23 NOTE — ED Notes (Signed)
Pt  Reports     Pt  Fell  At  Work  Yesterday  And    Inj     Pt  Reports  Pain       In the  Affected  Leg  soshe  Has  Some  Bruising  Noted   She  Ambulated  To room  With  A  Steady  Fluid  Gait

## 2014-05-23 NOTE — ED Provider Notes (Signed)
[name] is a 53 y.o. female who presents to Urgent Care today for left knee pain. Patient tripped and fell at work yesterday. 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 She landed onto her left knee. She notes left knee pain and swelling. She has tried rest ice elevation and ibuprofen which is only helped a little. The pain is worse with activity and better with rest. Symptoms are moderate. No radiating pain weakness or numbness.   Past Medical History  Diagnosis Date  . Arthritis    History  Substance Use Topics  . Smoking status: Current Every Day Smoker -- 0.50 packs/day    Types: Cigarettes  . Smokeless tobacco: Not on file  . Alcohol Use: Yes     Comment: social use   ROS as above Medications: No current facility-administered medications for this encounter.   Current Outpatient Prescriptions  Medication Sig Dispense Refill  . diclofenac (VOLTAREN) 50 MG EC tablet Take 1 tablet (50 mg total) by mouth 2 (two) times daily as needed.  30 tablet  0    Exam:  BP 120/60  Pulse 78  Temp(Src) 98.6 F (37 C) (Oral)  Resp 14  SpO2 100% Gen: Well NAD Left knee: Mild ecchymosis on the anterior aspect of the knee with no significant effusion. Full range of motion Mildly tender anterior knee Stable ligamentous exam   No results found for this or any previous visit (from the past 24 hour(s)). Dg Knee Complete 4 Views Left  05/23/2014   CLINICAL DATA:  [place] yesterday.  Hips and injured left knee.  EXAM: LEFT KNEE - COMPLETE 4+ VIEW  COMPARISON:  05/19/2014.  FINDINGS: No acute fracture. Knee joint is normally aligned. There are marginal osteophytes from the medial lateral compartments. No bone lesion.  Moderate joint effusion.  Soft tissues are unremarkable.  IMPRESSION: No fracture or dislocation. Degenerative changes and moderate joint effusion similar to the prior study.   Electronically Signed   By: Lajean Manes M.D.   On: 05/23/2014 18:04    Assessment and Plan: 53 y.o. female  with left knee pain. Patient has a contusion in the setting of pre-existing DJD. Plan to treat with diclofenac and relative rest. Off tomorrow and return to work with light duty on Saturday and Sunday. Followup with occupational health clinic as soon as possible.  Discussed warning signs or symptoms. Please see discharge instructions. Patient expresses understanding.   This note was created using Systems analyst. Any transcription errors are unintended.    Gregor Hams, MD 05/23/14 [phone]

## 2014-07-18 ENCOUNTER — Other Ambulatory Visit (HOSPITAL_COMMUNITY): Payer: Self-pay | Admitting: Orthopedic Surgery

## 2014-07-18 DIAGNOSIS — M25562 Pain in left knee: Secondary | ICD-10-CM

## 2014-07-27 ENCOUNTER — Other Ambulatory Visit: Payer: Self-pay

## 2014-08-07 ENCOUNTER — Ambulatory Visit (HOSPITAL_COMMUNITY)
Admission: RE | Admit: 2014-08-07 | Discharge: 2014-08-07 | Disposition: A | Discharge status: Home / Self Care | Payer: 59 | Source: Ambulatory Visit | Attending: Orthopedic Surgery | Admitting: Orthopedic Surgery

## 2014-08-07 DIAGNOSIS — S83272A Complex tear of lateral meniscus, current injury, left knee, initial encounter: Secondary | ICD-10-CM | POA: Insufficient documentation

## 2014-08-07 DIAGNOSIS — M7122 Synovial cyst of popliteal space [Baker], left knee: Secondary | ICD-10-CM | POA: Diagnosis not present

## 2014-08-07 DIAGNOSIS — W19XXXA Unspecified fall, initial encounter: Secondary | ICD-10-CM | POA: Diagnosis not present

## 2014-08-07 DIAGNOSIS — M25562 Pain in left knee: Secondary | ICD-10-CM | POA: Diagnosis present

## 2014-08-07 DIAGNOSIS — M179 Osteoarthritis of knee, unspecified: Secondary | ICD-10-CM | POA: Diagnosis present

## 2014-08-07 DIAGNOSIS — M25462 Effusion, left knee: Secondary | ICD-10-CM | POA: Insufficient documentation

## 2014-08-07 DIAGNOSIS — Y990 Civilian activity done for income or pay: Secondary | ICD-10-CM | POA: Insufficient documentation

## 2014-10-09 ENCOUNTER — Encounter (HOSPITAL_BASED_OUTPATIENT_CLINIC_OR_DEPARTMENT_OTHER): Payer: Self-pay | Admitting: *Deleted

## 2014-10-16 ENCOUNTER — Encounter (HOSPITAL_BASED_OUTPATIENT_CLINIC_OR_DEPARTMENT_OTHER): Payer: Self-pay | Admitting: *Deleted

## 2014-10-16 NOTE — Progress Notes (Signed)
Pt instructed npo p mn .  To [facility] 1/8 @ 1100.  Needs hgb on arrival

## 2014-10-19 ENCOUNTER — Ambulatory Visit (HOSPITAL_BASED_OUTPATIENT_CLINIC_OR_DEPARTMENT_OTHER): Payer: 59 | Admitting: Anesthesiology

## 2014-10-19 ENCOUNTER — Encounter (HOSPITAL_BASED_OUTPATIENT_CLINIC_OR_DEPARTMENT_OTHER): Payer: Self-pay | Admitting: *Deleted

## 2014-10-19 ENCOUNTER — Ambulatory Visit (HOSPITAL_BASED_OUTPATIENT_CLINIC_OR_DEPARTMENT_OTHER)
Admission: RE | Admit: 2014-10-19 | Discharge: 2014-10-19 | Disposition: A | Discharge status: Home / Self Care | Payer: 59 | Source: Ambulatory Visit | Attending: Orthopedic Surgery | Admitting: Orthopedic Surgery

## 2014-10-19 ENCOUNTER — Encounter (HOSPITAL_BASED_OUTPATIENT_CLINIC_OR_DEPARTMENT_OTHER)
Admission: RE | Disposition: A | Discharge status: Home / Self Care | Payer: Self-pay | Source: Ambulatory Visit | Attending: Orthopedic Surgery

## 2014-10-19 DIAGNOSIS — W010XXA Fall on same level from slipping, tripping and stumbling without subsequent striking against object, initial encounter: Secondary | ICD-10-CM | POA: Insufficient documentation

## 2014-10-19 DIAGNOSIS — S83282A Other tear of lateral meniscus, current injury, left knee, initial encounter: Secondary | ICD-10-CM | POA: Diagnosis not present

## 2014-10-19 DIAGNOSIS — M2242 Chondromalacia patellae, left knee: Secondary | ICD-10-CM | POA: Diagnosis not present

## 2014-10-19 HISTORY — PX: KNEE ARTHROSCOPY WITH LATERAL MENISECTOMY: SHX6193

## 2014-10-19 HISTORY — DX: Adverse effect of unspecified anesthetic, initial encounter: T41.45XA

## 2014-10-19 HISTORY — PX: CHONDROPLASTY: SHX5177

## 2014-10-19 HISTORY — PX: KNEE ARTHROSCOPY: SHX127

## 2014-10-19 HISTORY — DX: Unspecified tear of unspecified meniscus, current injury, left knee, initial encounter: S83.207A

## 2014-10-19 HISTORY — DX: Osteoarthritis of knee, unspecified: M17.9

## 2014-10-19 HISTORY — DX: Unilateral primary osteoarthritis, unspecified knee: M17.10

## 2014-10-19 LAB — POCT HEMOGLOBIN-HEMACUE: Hemoglobin: 13.9 g/dL (ref 12.0–15.0)

## 2014-10-19 SURGERY — ARTHROSCOPY, KNEE
Anesthesia: General | Site: Knee | Laterality: Left

## 2014-10-19 MED ORDER — BUPIVACAINE-EPINEPHRINE 0.25% -1:200000 IJ SOLN
INTRAMUSCULAR | Status: DC | PRN
  Administered 2014-10-19: 30 mL

## 2014-10-19 MED ORDER — PROPOFOL 10 MG/ML IV BOLUS
INTRAVENOUS | Status: DC | PRN
  Administered 2014-10-19: 160 mg via INTRAVENOUS
  Administered 2014-10-19: 40 mg via INTRAVENOUS

## 2014-10-19 MED ORDER — FENTANYL CITRATE 0.05 MG/ML IJ SOLN
INTRAMUSCULAR | Status: AC
  Filled 2014-10-19: qty 2

## 2014-10-19 MED ORDER — DEXAMETHASONE SODIUM PHOSPHATE 4 MG/ML IJ SOLN
INTRAMUSCULAR | Status: DC | PRN
  Administered 2014-10-19: 10 mg via INTRAVENOUS

## 2014-10-19 MED ORDER — FENTANYL CITRATE 0.05 MG/ML IJ SOLN
INTRAMUSCULAR | Status: DC | PRN
  Administered 2014-10-19: 100 ug via INTRAVENOUS

## 2014-10-19 MED ORDER — OXYCODONE HCL 5 MG/5ML PO SOLN
5.0000 mg | Freq: Once | ORAL | Status: DC | PRN
  Filled 2014-10-19: qty 5

## 2014-10-19 MED ORDER — LIDOCAINE HCL (CARDIAC) 20 MG/ML IV SOLN
INTRAVENOUS | Status: DC | PRN
  Administered 2014-10-19: 60 mg via INTRAVENOUS

## 2014-10-19 MED ORDER — HYDROCODONE-ACETAMINOPHEN 5-325 MG PO TABS: 1.0000 | ORAL_TABLET | Freq: Four times a day (QID) | ORAL | Status: DC | PRN

## 2014-10-19 MED ORDER — LIDOCAINE-EPINEPHRINE (PF) 1 %-1:200000 IJ SOLN
INTRAMUSCULAR | Status: DC | PRN
  Administered 2014-10-19: 30 mL

## 2014-10-19 MED ORDER — OXYCODONE HCL 5 MG PO TABS
5.0000 mg | ORAL_TABLET | Freq: Once | ORAL | Status: DC | PRN
  Filled 2014-10-19: qty 1

## 2014-10-19 MED ORDER — ASPIRIN EC 325 MG PO TBEC: 325.0000 mg | DELAYED_RELEASE_TABLET | Freq: Every day | ORAL | Status: DC

## 2014-10-19 MED ORDER — PROMETHAZINE HCL 25 MG/ML IJ SOLN
6.2500 mg | INTRAMUSCULAR | Status: DC | PRN
  Filled 2014-10-19: qty 1

## 2014-10-19 MED ORDER — HYDROMORPHONE HCL 1 MG/ML IJ SOLN
0.2500 mg | INTRAMUSCULAR | Status: DC | PRN
  Filled 2014-10-19: qty 1

## 2014-10-19 MED ORDER — ONDANSETRON HCL 4 MG/2ML IJ SOLN
INTRAMUSCULAR | Status: DC | PRN
  Administered 2014-10-19: 4 mg via INTRAVENOUS

## 2014-10-19 MED ORDER — LACTATED RINGERS IV SOLN
INTRAVENOUS | Status: DC
  Administered 2014-10-19: 12:00:00 via INTRAVENOUS
  Filled 2014-10-19: qty 1000

## 2014-10-19 MED ORDER — ACETAMINOPHEN 10 MG/ML IV SOLN
INTRAVENOUS | Status: DC | PRN
Start: 2014-10-19 — End: 2014-10-19
  Administered 2014-10-19: 1000 mg via INTRAVENOUS

## 2014-10-19 MED ORDER — KETOROLAC TROMETHAMINE 30 MG/ML IJ SOLN
INTRAMUSCULAR | Status: DC | PRN
  Administered 2014-10-19: 30 mg via INTRAVENOUS

## 2014-10-19 MED ORDER — METHOCARBAMOL 500 MG PO TABS: 500.0000 mg | ORAL_TABLET | Freq: Four times a day (QID) | ORAL | Status: DC

## 2014-10-19 MED ORDER — MELOXICAM 7.5 MG PO TABS: 15.0000 mg | ORAL_TABLET | Freq: Every day | ORAL | Status: DC

## 2014-10-19 MED ORDER — MIDAZOLAM HCL 5 MG/5ML IJ SOLN
INTRAMUSCULAR | Status: DC | PRN
  Administered 2014-10-19: 2 mg via INTRAVENOUS

## 2014-10-19 MED ORDER — SODIUM CHLORIDE 0.9 % IR SOLN
Status: DC | PRN
  Administered 2014-10-19: 6000 mL

## 2014-10-19 MED ORDER — MIDAZOLAM HCL 2 MG/2ML IJ SOLN
INTRAMUSCULAR | Status: AC
  Filled 2014-10-19: qty 2

## 2014-10-19 MED ORDER — CEFAZOLIN SODIUM-DEXTROSE 2-3 GM-% IV SOLR
INTRAVENOUS | Status: AC
  Filled 2014-10-19: qty 50

## 2014-10-19 MED ORDER — CEFAZOLIN SODIUM-DEXTROSE 2-3 GM-% IV SOLR
INTRAVENOUS | Status: DC | PRN
  Administered 2014-10-19: 2 g via INTRAVENOUS

## 2014-10-19 SURGICAL SUPPLY — 36 items
BANDAGE ELASTIC 6 VELCRO ST LF (GAUZE/BANDAGES/DRESSINGS) ×2 IMPLANT
BLADE 4.2CUDA (BLADE) IMPLANT
BLADE CUDA SHAVER 3.5 (BLADE) ×2 IMPLANT
CANISTER SUCT LVC 12 LTR MEDI- (MISCELLANEOUS) ×2 IMPLANT
CANISTER SUCTION 2500CC (MISCELLANEOUS) ×2 IMPLANT
CLOTH BEACON ORANGE TIMEOUT ST (SAFETY) ×2 IMPLANT
DRAPE ARTHROSCOPY W/POUCH 114 (DRAPES) ×2 IMPLANT
DRSG EMULSION OIL 3X3 NADH (GAUZE/BANDAGES/DRESSINGS) ×2 IMPLANT
DRSG PAD ABDOMINAL 8X10 ST (GAUZE/BANDAGES/DRESSINGS) ×1 IMPLANT
DURAPREP 26ML APPLICATOR (WOUND CARE) ×2 IMPLANT
ELECT MENISCUS 165MM 90D (ELECTRODE) IMPLANT
ELECT REM PT RETURN 9FT ADLT (ELECTROSURGICAL)
ELECTRODE REM PT RTRN 9FT ADLT (ELECTROSURGICAL) IMPLANT
GLOVE BIO SURGEON STRL SZ7.5 (GLOVE) ×2 IMPLANT
GLOVE BIOGEL M 6.5 STRL (GLOVE) ×1 IMPLANT
GLOVE BIOGEL PI IND STRL 6.5 (GLOVE) IMPLANT
GLOVE BIOGEL PI IND STRL 7.5 (GLOVE) IMPLANT
GLOVE BIOGEL PI INDICATOR 6.5 (GLOVE) ×1
GLOVE BIOGEL PI INDICATOR 7.5 (GLOVE) ×1
GOWN PREVENTION PLUS LG XLONG (DISPOSABLE) ×2 IMPLANT
KNEE WRAP E Z 3 GEL PACK (MISCELLANEOUS) ×2 IMPLANT
PACK ARTHROSCOPY DSU (CUSTOM PROCEDURE TRAY) ×2 IMPLANT
PACK BASIN DAY SURGERY FS (CUSTOM PROCEDURE TRAY) ×2 IMPLANT
PADDING CAST ABS 4INX4YD NS (CAST SUPPLIES) ×1
PADDING CAST ABS COTTON 4X4 ST (CAST SUPPLIES) IMPLANT
PENCIL BUTTON HOLSTER BLD 10FT (ELECTRODE) IMPLANT
SET ARTHROSCOPY TUBING (MISCELLANEOUS) ×2
SET ARTHROSCOPY TUBING LN (MISCELLANEOUS) ×1 IMPLANT
SPONGE GAUZE 4X4 12PLY (GAUZE/BANDAGES/DRESSINGS) ×2 IMPLANT
SPONGE GAUZE 4X4 12PLY STER LF (GAUZE/BANDAGES/DRESSINGS) ×1 IMPLANT
SUT ETHILON 4 0 PS 2 18 (SUTURE) ×2 IMPLANT
SYR 30ML LL (SYRINGE) ×2 IMPLANT
SYR CONTROL 10ML LL (SYRINGE) ×2 IMPLANT
TOWEL OR 17X24 6PK STRL BLUE (TOWEL DISPOSABLE) ×3 IMPLANT
TUBE CONNECTING 12X1/4 (SUCTIONS) ×2 IMPLANT
WATER STERILE IRR 500ML POUR (IV SOLUTION) ×2 IMPLANT

## 2014-10-19 NOTE — Anesthesia Procedure Notes (Signed)
Procedure Name: LMA Insertion Date/Time: 10/19/2014 1:53 PM Performed by: Bethena Roys T Pre-anesthesia Checklist: Patient identified, Emergency Drugs available, Suction available and Patient being monitored Patient Re-evaluated:Patient Re-evaluated prior to inductionOxygen Delivery Method: Circle System Utilized Preoxygenation: Pre-oxygenation with 100% oxygen Intubation Type: IV induction Ventilation: Mask ventilation without difficulty LMA: LMA with gastric port inserted LMA Size: 4.0 Number of attempts: 1 Placement Confirmation: positive ETCO2 Tube secured with: Tape Dental Injury: Teeth and Oropharynx as per pre-operative assessment

## 2014-10-19 NOTE — Discharge Instructions (Signed)
Keep incisions dry until follow up and sutures removed  Move knee as tolerable, try to not keep it still for long periods of time  WBAT, free to be as active as you want   Post Anesthesia Home Care Instructions  Activity: Get plenty of rest for the remainder of the day. A responsible adult should stay with you for 24 hours following the procedure.  For the next 24 hours, DO NOT: -Drive a car -Paediatric nurse -Drink alcoholic beverages -Take any medication unless instructed by your physician -Make any legal decisions or sign important papers.  Meals: Start with liquid foods such as gelatin or soup. Progress to regular foods as tolerated. Avoid greasy, spicy, heavy foods. If nausea and/or vomiting occur, drink only clear liquids until the nausea and/or vomiting subsides. Call your physician if vomiting continues.  Special Instructions/Symptoms: Your throat may feel dry or sore from the anesthesia or the breathing tube placed in your throat during surgery. If this causes discomfort, gargle with warm salt water. The discomfort should disappear within 24 hours.    ARTHROSCOPIC KNEE SURGERY HOME CARE INSTRUCTIONS   PAIN You will be expected to have a moderate amount of pain in the affected knee for approximately two weeks.  However, the first two to four days will be the most severe in terms of the pain you will experience.  Prescriptions have been provided for you to take as needed for the pain.  The pain can be markedly reduced by using the ice/compressive bandage given.  Exchange the ice packs whenever they thaw.  During the night, keep the bandage on because it will still provide some compression for the swelling.  Also, keep the leg elevated on pillows above your heart, and this will help alleviate the pain and swelling.  MEDICATION Prescriptions have been provided to take as needed for pain. To prevent blood clots, take Aspirin 325mg  daily with a meal if not on a blood thinner and  if no history of stomach ulcers.  ACTIVITY It is preferred that you stay on bedrest for approximately 24 hours.  However, you may go to the bathroom with help.  After this, you can start to be up and about progressively more.  Remember that the swelling may still increase after three to four days if you are up and doing too much.  You may put as much weight on the affected leg as pain will allow.  Use your crutches for comfort and safety.  However, as soon as you are able, you may discard the crutches and go without them.   DRESSING Keep the current dressing as dry as possible.  Two days after your surgery, you may remove the ice/compressive wrap, and surgical dressing.  You may now take a shower, but do not scrub the sounds directly with soap.  Let water rinse over these and gently wipe with your hand.  Reapply band-aids over the puncture wounds and more gauze if needed.  A slight amount of thin drainage can be normal at this time, and do not let it frighten you.  Reapply the ice/compressive wrap.  You may now repeat this every day each time you shower.  SYMPTOMS TO REPORT TO YOUR DOCTOR  -Extreme pain.  -Extreme swelling.  -Temperature above 101 degrees that does not come down with acetaminophen (Tylenol).  -Any changes in the feeling, color or movement of your toes.  -Extreme redness, heat, swelling or drainage at your incision  EXERCISE It is preferred that you begin  to exercise on the day of your surgery.  Straight leg raises and short arc quads should be begun the afternoon or evening of surgery and continued until you come back for your follow-up appointment.   Attached is an instruction sheet on how to perform these two simple exercises.  Do these at least three times per day if not more.  You may bend your knee as much as is comfortable.  The puncture wounds may occasionally be slightly uncomfortable with bending of the knee.  Do not let this frighten you.  It is important to keep your knee  motion, but do not overdo it.  If you have significant pain, simply do not bend the knee as far.   You will be given more exercises to perform at your first return visit.    RETURN APPOINTMENT Please make an appointment to be seen by your doctor in  days from your surgery.

## 2014-10-19 NOTE — Anesthesia Postprocedure Evaluation (Signed)
Anesthesia Post Note  Patient: [name]  Procedure(s) Performed: Procedure(s) (LRB): LEFT ARTHROSCOPY KNEE WITH DEBRIEDMENT (Left) KNEE ARTHROSCOPY WITH LATERAL MENISECTOMY (Left)  LEFT MEDIAL PETELLA CHONDROPLASTY (Left)  Anesthesia type: General  Patient location: PACU  Post pain: Pain level controlled  Post assessment: Post-op Vital signs reviewed  Last Vitals: BP 140/95 mmHg  Pulse 64  Temp(Src) 36.5 C (Oral)  Resp 11  Ht 5\' 3"  (1.6 m)  Wt 177 lb (80.287 kg)  BMI 31.36 kg/m2  SpO2 95%  Post vital signs: Reviewed  Level of consciousness: sedated  Complications: No apparent anesthesia complications

## 2014-10-19 NOTE — H&P (Signed)
[name] is an 54 y.o. female.    Procedure:  Left knee arthroscopy and debridement  Chief Complaint:   Left knee pain with LMT  HPI: Pt is a 54 y.o. female complaining of left knee pain.  She was working in August 2015 and tripped over a wheelchair. She fell and twisted her left knee. She had pain and discomfort in the knee. She was seen at the emergency room, had x rays, which revealed osteoarthritic changes, but no fractures. She saw Dr. Alvan Dame up on the floor and was placed on ibuprofen which she states really did not help much. Her knee catches, gives away, and has continued to do this. Due to her symptoms she presented to the clinic where an MRI was obtained. MRI revealed: tricompartmental degenerative changes, moderately advanced within the lateral compartment; tearing of the anterior horn and body of the lateral meniscus with resulting meniscal extrusion; intact MMT, cruciate ligaments and collateral ligaments.  Various options are discussed with the patient. Risks, benefits and expectations were discussed with the patient. Patient understand the risks, benefits and expectations and wishes to proceed with surgery.    PMH: Past Medical History  Diagnosis Date  . Acute meniscal tear of left knee   . OA (osteoarthritis) of knee   . Complication of anesthesia 5/10    severe sore throat     PSH: Past Surgical History  Procedure Laterality Date  . Total knee arthroplasty Right 05-26-2010  . Robotic assisted total hysterectomy  02-26-2009  . Hysteroscopy w/d&c  05-11-2000  . Dilation and curettage of uterus  1996  . Cesarean section    . Tubal ligation      Social History:  reports that she has been smoking Cigarettes.  She has been smoking about 0.50 packs per day. She does not have any smokeless tobacco history on file. She reports that she drinks about 1.2 - 1.8 oz of alcohol per week. She reports that she does not use illicit drugs.  Allergies:  No Known  Allergies  Medications: No current facility-administered medications for this encounter.    Review of Systems  Constitutional: Negative.   HENT: Negative.   Eyes: Negative.   Respiratory: Negative.   Cardiovascular: Negative.   Gastrointestinal: Negative.   Genitourinary: Negative.   Musculoskeletal: Positive for joint pain.  Skin: Negative.   Neurological: Negative.   Endo/Heme/Allergies: Negative.   Psychiatric/Behavioral: Negative.        Physical Exam  Constitutional: She is oriented to person, place, and time. She appears well-developed and well-nourished.  HENT:  Head: Normocephalic and atraumatic.  Eyes: Pupils are equal, round, and reactive to light.  Neck: Neck supple. No JVD present. No tracheal deviation present. No thyromegaly present.  Cardiovascular: Normal rate and intact distal pulses.   Respiratory: Effort normal and breath sounds normal.  GI: Soft. There is no tenderness. There is no guarding.  Musculoskeletal:       Left knee: She exhibits decreased range of motion, swelling and bony tenderness. She exhibits no ecchymosis, no deformity, no laceration, no erythema and normal alignment. Tenderness found.  Lymphadenopathy:    She has no cervical adenopathy.  Neurological: She is alert and oriented to person, place, and time.  Skin: Skin is warm and dry.  Psychiatric: She has a normal mood and affect.     Assessment/Plan Assessment:    Left knee pain with LMT    Plan: Patient will undergo a left knee arthroscopy and debridement on 10/19/2014 per Dr.  Alvan Dame at [facility]. Risks benefits and expectations were discussed with the patient. Patient understand risks, benefits and expectations and wishes to proceed.     [place] Pugh Linh Johannes   PA-C  10/19/2014, 11:03 AM

## 2014-10-19 NOTE — Interval H&P Note (Signed)
History and Physical Interval Note:  10/19/2014 1:06 PM  [place]  has presented today for surgery, with the diagnosis of LEFT KNEE LATERAL MENICUS TEAR AND OA  The various methods of treatment have been discussed with the patient and family. After consideration of risks, benefits and other options for treatment, the patient has consented to  Procedure(s): LEFT ARTHROSCOPY KNEE WITH DEBRIEDMENT (Left) as a surgical intervention .  The patient's history has been reviewed, patient examined, no change in status, stable for surgery.  I have reviewed the patient's chart and labs.  Questions were answered to the patient's satisfaction.     [name]

## 2014-10-19 NOTE — Anesthesia Preprocedure Evaluation (Addendum)
Anesthesia Evaluation  Patient identified by MRN, date of birth, ID band Patient awake    Reviewed: Allergy & Precautions, H&P , NPO status , Patient's Chart, lab work & pertinent test results  Airway Mallampati: II  TM Distance: >3 FB Neck ROM: full    Dental  (+) Teeth Intact, Dental Advidsory Given, Missing   Pulmonary Current Smoker,  breath sounds clear to auscultation        Cardiovascular negative cardio ROS  Rhythm:regular Rate:Normal     Neuro/Psych negative neurological ROS  negative psych ROS   GI/Hepatic negative GI ROS, Neg liver ROS,   Endo/Other  negative endocrine ROS  Renal/GU negative Renal ROS     Musculoskeletal   Abdominal   Peds  Hematology   Anesthesia Other Findings   Reproductive/Obstetrics negative OB ROS                            Anesthesia Physical Anesthesia Plan  ASA: II  Anesthesia Plan: General LMA   Post-op Pain Management:    Induction: Intravenous  Airway Management Planned:   Additional Equipment:   Intra-op Plan:   Post-operative Plan: Extubation in OR  Informed Consent: I have reviewed the patients History and Physical, chart, labs and discussed the procedure including the risks, benefits and alternatives for the proposed anesthesia with the patient or authorized representative who has indicated his/her understanding and acceptance.   Dental advisory given  Plan Discussed with: CRNA  Anesthesia Plan Comments:        Anesthesia Quick Evaluation

## 2014-10-19 NOTE — Brief Op Note (Signed)
10/19/2014  1:12 PM  PATIENT:  [name]  54 y.o. female  PRE-OPERATIVE DIAGNOSIS:  1. LEFT KNEE LATERAL MENICUS TEAR, 2. Grade II-III chondromalacia lateral and patellofemoral compartments  POST-OPERATIVE DIAGNOSIS:  1. LEFT KNEE LATERAL MENICUS TEAR, 2. Grade II-III chondromalacia lateral and patellofemoral compartments   PROCEDURE:  Procedure(s): LEFT knee ARTHROSCOPY with Lateral subtotal meniscectomy, lateral and patellofemoral chondroplasty  SURGEON:  Surgeon(s) and Role:    * [name], MD - Primary  PHYSICIAN ASSISTANT: None  ANESTHESIA:   general  EBL:     BLOOD ADMINISTERED:none  DRAINS: none   LOCAL MEDICATIONS USED:  MARCAINE     SPECIMEN:  No Specimen  DISPOSITION OF SPECIMEN:  N/A  COUNTS:  YES  TOURNIQUET:  * No tourniquets in log *  DICTATION: .Other Dictation: Dictation Number [id number]  PLAN OF CARE: Discharge to home after PACU  PATIENT DISPOSITION:  PACU - hemodynamically stable.   Delay start of Pharmacological VTE agent (>24hrs) due to surgical blood loss or risk of bleeding: no

## 2014-10-19 NOTE — Transfer of Care (Signed)
Immediate Anesthesia Transfer of Care Note  Patient: [name]  Procedure(s) Performed: Procedure(s): LEFT ARTHROSCOPY KNEE WITH DEBRIEDMENT (Left) KNEE ARTHROSCOPY WITH LATERAL MENISECTOMY (Left)  LEFT MEDIAL PETELLA CHONDROPLASTY (Left)  Patient Location: PACU  Anesthesia Type:General  Level of Consciousness: awake, alert  and oriented  Airway & Oxygen Therapy: Patient Spontanous Breathing and Patient connected to nasal cannula oxygen  Post-op Assessment: Report given to PACU RN  Post vital signs: Reviewed and stable  Complications: No apparent anesthesia complications

## 2014-10-22 ENCOUNTER — Encounter (HOSPITAL_BASED_OUTPATIENT_CLINIC_OR_DEPARTMENT_OTHER): Payer: Self-pay | Admitting: Orthopedic Surgery

## 2014-10-22 NOTE — Op Note (Signed)
NAMEKAMORIA, LUCIEN                  ACCOUNT NO.:  [id number]  MEDICAL RECORD NO.:  [id number]  LOCATION:                                 FACILITY:  PHYSICIAN:  Pietro Cassis. Alvan Dame, M.D.  DATE OF BIRTH:  1961/07/10  DATE OF PROCEDURE:  10/19/2014 DATE OF DISCHARGE:  10/19/2014                              OPERATIVE REPORT   PREOPERATIVE DIAGNOSIS:  Left knee lateral meniscal tear and degenerative joint changes.  POSTOPERATIVE DIAGNOSES/FINDINGS: 1. Complex tearing to the lateral meniscus with foot component. 2. Grade 2-3 chondromalacia noted on the lateral compartment with     three diffuse changes on the patellofemoral joint.  PROCEDURES: 1. Left knee diagnostic and operative arthroscopy with lateral     subtotal meniscectomy. 2. Lateral and patellofemoral chondroplasty.  Debridement of unstable     Flaps, no microfracture or abrasion chondroplasty required.  SURGEON:  Pietro Cassis. Alvan Dame, M.D.  ASSISTANT:  Surgical team.  ANESTHESIA:  General plus postprocedural local block.  COMPLICATION:  None.  BLOOD LOSS:  None.  TOURNIQUET:  None.  DRAINS:  None.  INDICATIONS FOR PROCEDURE:  Ms. Aytes is a very pleasant 54 year old female with history of right total knee arthroplasty.  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  She was seen and evaluated and noted to have lateral joint line tenderness.  MRI revealed lateral meniscal pathology. Given the recurrence of her pain, swelling and aggravation, she wished to proceed with surgery.  At this point, based on radiographic assessment, she was noted to be a candidate for conservative arthroscopic procedure for mechanical debridement as opposed to more advanced arthroplasty options.  Risks of infection, DVT discussed, and risks of progression of arthritic disease and need for eventual knee arthroplasty discussed and  reviewed.  Consent will be obtained based on Our discussion and her experiences in the past.  PROCEDURE IN DETAIL:  The patient was brought to the operative theater. Once adequate anesthesia, preoperative antibiotics, Ancef administered, she was positioned supine with left leg in a leg holder.  The left lower extremity was then prepped and draped in sterile fashion.  Time-out was performed to identify the patient, planned procedure, and extremity. Standard anterior-medial, superior-medial and inferior-lateral portals were utilized.  Diagnostic evaluation of the knee revealed the above findings.  Initially, the inferior-medial portal was utilized as my working portal.  Then using a combination of straight biting basket and a 3.5 Cuda shaver, a subtotal meniscectomy was carried out.  I did switch portals, switching the camera to the inferior-medial and working portal inferior-lateral to further work on debridement, identified a significant component into the notch.  Once I had satisfactorily debrided this meniscus back to a stable peripheral rim, I did perform chondroplasty laterally on necessary chondral flaps so we were able to see degenerative changes present.  At conclusion, meniscus was diminutive, but still present mainly in the posterior aspect.  Subtotal meniscectomy was carried out mainly in the anterior and midbody portions.  We had already identified a relatively intact and normal medial compartment with intact,  but somewhat degenerative anterior crucial ligament.  Anteriorly in the patellofemoral compartment, chondroplasty was carried out debriding chondral flaps back to stable levels.  There was no evidence of eburnated bone, but diffuse degenerative changes appreciated.  Final evaluation of the knee revealed stability of the cartilage, both articular and meniscal surface as well as partial Partially performed synovectomy due to a large effusion preoperatively.  Following  this, instrumentation was removed, then the portal sites were reapproximated using 4-0 nylon.  The knee was injected at the end of the case with 0.25% Marcaine, 30 mL.  The knee was then dressed with a sterile wrap. She was brought to the recovery room in stable condition tolerating the procedure well.  She will follow up in the office in 10-12 days and will initiate physical therapy, and any other further assistance to maximize the rate of her recovery.     Pietro Cassis Alvan Dame, M.D.     MDO/MEDQ  D:  10/20/2014  T:  10/20/2014  Job:  [id number]

## 2014-12-06 ENCOUNTER — Ambulatory Visit: Payer: 59 | Attending: Orthopedic Surgery | Admitting: Physical Therapy

## 2014-12-06 DIAGNOSIS — R269 Unspecified abnormalities of gait and mobility: Secondary | ICD-10-CM | POA: Insufficient documentation

## 2014-12-06 DIAGNOSIS — M25562 Pain in left knee: Secondary | ICD-10-CM | POA: Diagnosis not present

## 2014-12-06 NOTE — Therapy (Signed)
[place], [place], [address] Phone: [phone]   Fax:  [phone]  Physical Therapy Evaluation  Patient Details  Name: [name] MRN: [id number] Date of Birth: 1961/03/21 Referring Provider:  [name], MD  Encounter Date: 12/06/2014      PT End of Session - 12/06/14 1725    Visit Number 1   Number of Visits 12   Date for PT Re-Evaluation 01/17/15   PT Start Time 1017   PT Stop Time 1113   PT Time Calculation (min) 56 min   Activity Tolerance Patient tolerated treatment well   Behavior During Therapy [facility] for tasks assessed/performed      Past Medical History  Diagnosis Date  . Acute meniscal tear of left knee   . OA (osteoarthritis) of knee   . Complication of anesthesia 5/10    severe sore throat     Past Surgical History  Procedure Laterality Date  . Total knee arthroplasty Right 05-26-2010  . Robotic assisted total hysterectomy  02-26-2009  . Hysteroscopy w/d&c  05-11-2000  . Dilation and curettage of uterus  1996  . Cesarean section    . Tubal ligation    . Knee arthroscopy Left 10/19/2014    Procedure: LEFT ARTHROSCOPY KNEE WITH DEBRIEDMENT;  Surgeon: [name], MD;  Location: [facility];  Service: Orthopedics;  Laterality: Left;  . Knee arthroscopy with lateral menisectomy Left 10/19/2014    Procedure: KNEE ARTHROSCOPY WITH LATERAL MENISECTOMY;  Surgeon: [name], MD;  Location: [facility];  Service: Orthopedics;  Laterality: Left;  . Chondroplasty Left 10/19/2014    Procedure:  LEFT MEDIAL PETELLA CHONDROPLASTY;  Surgeon: [name], MD;  Location: [facility];  Service: Orthopedics;  Laterality: Left;    There were no vitals taken for this visit.  Visit Diagnosis:  Knee pain, left  Abnormal gait      Subjective Assessment - 12/06/14 1021    Symptoms Pt had arthroscopy 10-20-14 and has 7/10 pain.  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   Pertinent History R TKR in  > 5 years   Limitations Walking;Standing   How long can you sit comfortably? unlimited   How long can you stand comfortably? 3 -4 hours   How long can you walk comfortably? 1 hour   Patient Stated Goals be able t return to work without knee pain.  Be able carry out cleaning without pain.   Currently in Pain? Yes   Pain Score 7    Pain Location Knee   Pain Orientation Left   Pain Descriptors / Indicators Pressure;Aching   Pain Type Surgical pain   Pain Onset More than a month ago  6 weeks          [facility] PT Assessment - 12/06/14 1029    Assessment   Medical Diagnosis left knee pain post arthroscopy   Onset Date 10/20/14   Prior Therapy none   Restrictions   Weight Bearing Restrictions Yes   LLE Weight Bearing --  full weight bearing   Balance Screen   Has the patient fallen in the past 6 months No   Has the patient had a decrease in activity level because of a fear of falling?  No   Is the patient reluctant to leave their home because of a fear of falling?  No   Home Environment   Living Enviornment Private  residence   Living Arrangements Children   Type of [place] to enter   Entrance Stairs-Number of Steps 2   Entrance Stairs-Rails None   Prior Function   Level of Independence Independent with basic ADLs;Independent with homemaking with ambulation;Independent with gait;Independent with transfers   Vocation Full time employment   Vocation Requirements housekeepiing at [employer]   Observation/Other Assessments   Skin Integrity Edema  R knee supra patellar R/L 40.5cm/44.5cm infra 34.0/35.5cm   Focus on Therapeutic Outcomes (FOTO)  Intake 39%  Limitation 61%  predicted 39%   Squat   Comments Pt unable to squat more than to 60 degrees with knees and pain   AROM   Right Knee Extension 2   Right Knee Flexion 114   Left Knee Extension 5   Left Knee Flexion 80    Strength   Overall Strength Comments Sls left 19 sec    Strength Assessment Site Knee;Hip   Palpation   Palpation joint line medial tenderness   Ambulation/Gait   Ambulation/Gait Yes   Assistive device None   Gait Pattern Decreased stance time - left;Left hip hike                  [facility] Adult PT Treatment/Exercise - 12/06/14 1029    Knee/Hip Exercises: Seated   Long Arc Quad Strengthening;2 sets  with [employer]   Knee/Hip Exercises: Supine   Quad Sets 3 sets   Short Arc Quad Sets 1 set;10 reps   Heel Slides 3 sets;10 reps   Cryotherapy   Number Minutes Cryotherapy 15 Minutes   Cryotherapy Location Knee  left   Type of Cryotherapy --  vaspneumatic                PT Education - 12/06/14 1105    Education provided Yes   Education Details Pt given intial strengtheing exercise for Left knee and vasopneumatic post exercises   Person(s) Educated Patient   Methods Explanation;Demonstration   Comprehension Verbalized understanding;Returned demonstration;Need further instruction          PT Short Term Goals - 12/06/14 1054    PT SHORT TERM GOAL #1   Title Pt will be independent with initial HEP   Time 2   Period Weeks   Status New   PT SHORT TERM GOAL #2   Title Pt will have pain reduced to at least 5/10   Time 2   Period Weeks   Status New   PT SHORT TERM GOAL #3   Title Pt knee flexion will increase to at least 90 degrees   Time 2   Period Weeks   Status New           PT Long Term Goals - 12/06/14 1716    PT LONG TERM GOAL #1   Title Pt will be independent  with advanced HEP   Time 6   Period Weeks   Status New   PT LONG TERM GOAL #2   Title Pt pain will decrease to at least 2/10 in order to negotiate stairs   Time 6   Period Weeks   Status New   PT LONG TERM GOAL #3   Title Pt will increase AAROM left kneeflexion from 2 - 115 degrees for improved mobility in order to ride in car/perform household chores   Time 6   Period Weeks    Status New   PT LONG TERM GOAL #4   Title FOTO will improve  from 61% limitation to at least 39% for improved functional mobility   Time 6   Period Weeks   Status New   PT LONG TERM GOAL #5   Title Pain will improve to at least 1/10 in order to perform work related squatting for housekeeping duties   Time 6   Period Weeks   Status New               Plan - 12/06/14 1023    Clinical Impression Statement Pt had arthroscopy on 10-20-14 and has been unable to get appt at clinic.  Pt presents today with 7/10 pain in Left knee post arthroscopy. Pt has been using ice on knee but not exercise.  Pt has been walking daily   but  pain limits distance.  Pt is unable to squat and needs full functioning of knee in order to complete work duties in housekeeping.   Pt has LE weakness and  inability to negotiate stairs without pain  Pt will benefit from skilled PT to maximize functional movement/.  Pt also has liimited flexion of left knee to 80 degrees.  Pt also unable to single limb stance on left for greater than 19 seconds and she demonstrates motor control weakness on level surface   Pt will benefit from skilled therapeutic intervention in order to improve on the following deficits Abnormal gait;Decreased range of motion;Postural dysfunction;Impaired flexibility;Difficulty walking;Pain;Decreased balance;Decreased scar mobility;Increased edema;Increased fascial restricitons   Rehab Potential Good   PT Frequency 2x / week   PT Duration 6 weeks   PT Treatment/Interventions ADLs/Self Care Home Management;Cryotherapy;Moist Heat;Ultrasound;Stair training;Gait training;Therapeutic activities;Therapeutic exercise;Balance training;Neuromuscular re-education;Dry needling;Manual techniques;Patient/family education;Passive range of motion   PT Next Visit Plan Progress exercise and add Clamshell       By signing I understand that I am ordering/authorizing the use of Iontophoresis using 4 mg/mL of dexamethasone as  a component of this plan of care.   Problem List There are no active problems to display for this patient. Voncille Lo, PT 12/06/2014 5:26 PM Phone: [phone] Fax: [facility] [address] [place], [place], [address] Phone: [phone]   Fax:  [phone]

## 2014-12-06 NOTE — Patient Instructions (Addendum)
   Copyright  VHI. All rights reserved.  HIP: Flexion / KNEE: Extension, Straight Leg Raise   Raise leg, keeping knee straight. Perform slowly. 10__ reps per set, _3_ sets per day, _7__ days per week twice a day   Copyright  VHI. All rights reserved.  Heel Slide   Bend knee and pull heel toward buttocks. Hold ___3-5_ seconds. Return. Repeat with other knee. Repeat __10x 3__ times. Do __2__ sessions per day.  http://gt2.exer.us/372   Copyright  VHI. All rights reserved.     Raise leg until knee is straight. Add reb theraband around Left ankle for added strengthening as shown in clinic _10__ reps per set, _3__ sets per day, __7_ days per week  Copyright  VHI. All rights reserved.  Short Arc [employer] a large can or rolled towel under leg. Straighten knee and leg. Hold __5__ seconds. . Repeat _5x10___ times. Do ___2_ sessions per day.  http://gt2.exer.us/365   Copyright  VHI. All rights reserved.  [address]  [place], [place] 12/06/2014 10:50 AM Phone: [phone] Fax: [phone]

## 2014-12-13 ENCOUNTER — Ambulatory Visit: Payer: 59 | Attending: Orthopedic Surgery | Admitting: Rehabilitation

## 2014-12-13 DIAGNOSIS — R269 Unspecified abnormalities of gait and mobility: Secondary | ICD-10-CM | POA: Insufficient documentation

## 2014-12-13 DIAGNOSIS — M25562 Pain in left knee: Secondary | ICD-10-CM | POA: Diagnosis present

## 2014-12-13 NOTE — Patient Instructions (Signed)
Bridge   Lie back, legs bent. Inhale, tighten abdominals, pressing hips up. Keeping ribs in, lengthen lower back. Exhale, rolling down along spine from top. Repeat __10-20__ times. Do __2__ sessions per day.  Copyright  VHI. All rights reserved.  External Rotation: Hip - Knees Apart With Pelvic Floor (Side-Lying)   Lie on left side with hips and knees slightly bent, band tied just above knees. Tighten abdominals while lifting top knee. Hold for _5__ seconds. . Repeat _20__ times. Do __2 times a day.   Copyright  VHI. All rights reserved.    Knee Flexion: Resisted (Sitting)   Sit with band under left foot and looped around ankle of supported leg. Pull unsupported leg back. Repeat __20__ times per set. Do _1___ sets per session. Do _2___ sessions per day.  http://orth.exer.us/695   Copyright  VHI. All rights reserved.

## 2014-12-13 NOTE — Therapy (Signed)
[place], [place], [address] Phone: [phone]   Fax:  [phone]  Physical Therapy Treatment  Patient Details  Name: [name] MRN: [id number] Date of Birth: Dec 02, 1960 Referring Provider:  [name], MD  Encounter Date: 12/13/2014      PT End of Session - 12/13/14 1407    Visit Number 2   Number of Visits 12   Date for PT Re-Evaluation 01/17/15   PT Start Time 0207   PT Stop Time 0245   PT Time Calculation (min) 38 min      Past Medical History  Diagnosis Date  . Acute meniscal tear of left knee   . OA (osteoarthritis) of knee   . Complication of anesthesia 5/10    severe sore throat     Past Surgical History  Procedure Laterality Date  . Total knee arthroplasty Right 05-26-2010  . Robotic assisted total hysterectomy  02-26-2009  . Hysteroscopy w/d&c  05-11-2000  . Dilation and curettage of uterus  1996  . Cesarean section    . Tubal ligation    . Knee arthroscopy Left 10/19/2014    Procedure: LEFT ARTHROSCOPY KNEE WITH DEBRIEDMENT;  Surgeon: [name], MD;  Location: [facility];  Service: Orthopedics;  Laterality: Left;  . Knee arthroscopy with lateral menisectomy Left 10/19/2014    Procedure: KNEE ARTHROSCOPY WITH LATERAL MENISECTOMY;  Surgeon: [name], MD;  Location: [facility];  Service: Orthopedics;  Laterality: Left;  . Chondroplasty Left 10/19/2014    Procedure:  LEFT MEDIAL PETELLA CHONDROPLASTY;  Surgeon: [name], MD;  Location: [facility];  Service: Orthopedics;  Laterality: Left;    There were no vitals taken for this visit.  Visit Diagnosis:  Abnormal gait  Knee pain, left        [facility] PT Assessment - 12/13/14 1409    Observation/Other Assessments   Skin Integrity 42.5 cm supra patella edema   AROM   Left Knee Extension 0   Left Knee Flexion 132                  [facility] Adult PT  Treatment/Exercise - 12/13/14 1411    Knee/Hip Exercises: Aerobic   Stationary Bike Rec Bike L1 x 6 min   Knee/Hip Exercises: Seated   Long Arc Quad Left;2 sets;10 reps   Long Arc Quad Weight 2 lbs.   Other Seated Knee Exercises hip flexion with 3 # 10 x 2   Other Seated Knee Exercises hamstring curls with green band 10 x 2   Knee/Hip Exercises: Supine   Quad Sets Left;10 reps   Short Arc Quad Sets Left;2 sets;10 reps   Short Arc Quad Sets Limitations 2 sets of 10 with 3#   Heel Slides 1 set;10 reps   Straight Leg Raises Left;10 reps   Straight Leg Raises Limitations fatigued at 10 reps   Knee/Hip Exercises: Sidelying   Clams x 20 with green band then same position lift foot into IR no band x 20                PT Education - 12/13/14 1442    Education provided Yes   Education Details [place], S/L Clam with green band, hamstring curls with green band   Person(s) Educated Patient   Methods Explanation;Handout   Comprehension Verbalized understanding          PT Short Term Goals - 12/06/14 1054  PT SHORT TERM GOAL #1   Title Pt will be independent with initial HEP   Time 2   Period Weeks   Status New   PT SHORT TERM GOAL #2   Title Pt will have pain reduced to at least 5/10   Time 2   Period Weeks   Status New   PT SHORT TERM GOAL #3   Title Pt knee flexion will increase to at least 90 degrees   Time 2   Period Weeks   Status New           PT Long Term Goals - 12/06/14 1716    PT LONG TERM GOAL #1   Title Pt will be independent  with advanced HEP   Time 6   Period Weeks   Status New   PT LONG TERM GOAL #2   Title Pt pain will decrease to at least 2/10 in order to negotiate stairs   Time 6   Period Weeks   Status New   PT LONG TERM GOAL #3   Title Pt will increase AAROM left kneeflexion from 2 - 115 degrees for improved mobility in order to ride in car/perform household chores   Time 6   Period Weeks   Status New   PT LONG TERM GOAL #4    Title FOTO will improve from 61% limitation to at least 39% for improved functional mobility   Time 6   Period Weeks   Status New   PT LONG TERM GOAL #5   Title Pain will improve to at least 1/10 in order to 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 Demonstrates weakness in hips. Added to HEP to address HEP weakness. She reports she is performing reciprocal stairs without difficulty.   PT Next Visit Plan begin standing knee strength, continue hip strength        Problem List There are no active problems to display for this patient.   Dorene Ar, [place] 12/13/2014, 2:49 PM  [facility] [address] [place], [place], [address] Phone: [phone]   Fax:  [phone]

## 2014-12-19 ENCOUNTER — Ambulatory Visit: Payer: 59 | Admitting: Physical Therapy

## 2014-12-19 DIAGNOSIS — M25562 Pain in left knee: Secondary | ICD-10-CM | POA: Diagnosis not present

## 2014-12-19 DIAGNOSIS — R269 Unspecified abnormalities of gait and mobility: Secondary | ICD-10-CM

## 2014-12-19 NOTE — Therapy (Addendum)
[place], [place], [address] Phone: [phone]   Fax:  [phone]  Physical Therapy Treatment  Patient Details  Name: [name] MRN: [id number] Date of Birth: 12-31-1960 Referring Provider:  Paralee Cancel, MD  Encounter Date: 12/19/2014      PT End of Session - 12/19/14 1459    Visit Number 3   Number of Visits 12   Date for PT Re-Evaluation 01/17/15   PT Start Time 3846   PT Stop Time 1505   PT Time Calculation (min) 52 min   Equipment Utilized During Treatment Gait belt   Activity Tolerance Patient tolerated treatment well   Behavior During Therapy [facility] for tasks assessed/performed      Past Medical History  Diagnosis Date  . Acute meniscal tear of left knee   . OA (osteoarthritis) of knee   . Complication of anesthesia 5/10    severe sore throat     Past Surgical History  Procedure Laterality Date  . Total knee arthroplasty Right 05-26-2010  . Robotic assisted total hysterectomy  02-26-2009  . Hysteroscopy w/d&c  05-11-2000  . Dilation and curettage of uterus  1996  . Cesarean section    . Tubal ligation    . Knee arthroscopy Left 10/19/2014    Procedure: LEFT ARTHROSCOPY KNEE WITH DEBRIEDMENT;  Surgeon: [name], MD;  Location: [facility];  Service: Orthopedics;  Laterality: Left;  . Knee arthroscopy with lateral menisectomy Left 10/19/2014    Procedure: KNEE ARTHROSCOPY WITH LATERAL MENISECTOMY;  Surgeon: [name], MD;  Location: [facility];  Service: Orthopedics;  Laterality: Left;  . Chondroplasty Left 10/19/2014    Procedure:  LEFT MEDIAL PETELLA CHONDROPLASTY;  Surgeon: [name], MD;  Location: [facility];  Service: Orthopedics;  Laterality: Left;    There were no vitals taken for this visit.  Visit Diagnosis:  Knee pain, left  Abnormal gait      Subjective Assessment - 12/19/14 1421    Symptoms pt reported  that last visit she left feeling good. she stated she did her exercises but notes that it gets sore following her home exercises.    Currently in Pain? Yes   Pain Score 7    Pain Location Knee   Pain Orientation Left   Pain Descriptors / Indicators Aching;Pressure   Pain Type Surgical pain   Pain Onset More than a month ago          [facility] PT Assessment - 12/19/14 0001    Observation/Other Assessments   Skin Integrity 39.2 cm @ mid patella                  [facility] Adult PT Treatment/Exercise - 12/19/14 1425    Knee/Hip Exercises: Aerobic   Stationary Bike Rec Bike L1 x 8 min   Knee/Hip Exercises: Standing   Forward Step Up Both;2 sets;10 reps;Step Height: 4";Step Height: 6"  1 set with 4 inch step, and 1 set with 6 inch step   Step Down Left;1 set;Step Height: 4";10 reps   Other Standing Knee Exercises lateral Band walks  red theraband 4 x 10 ft   Other Standing Knee Exercises Forward/ backward steam boats  red theraband x 15 ea.    Knee/Hip Exercises: Seated   Long Arc Quad AROM;Strengthening;Left;2 sets;10 reps;Weights  3#    Long Arc Quad Weight 3 lbs.   Other Seated Knee Exercises hamstring curls with green band  10 x 2   Knee/Hip Exercises: Supine   Short Arc Quad Sets AROM;Strengthening;Left;2 sets;10 reps  3#   Straight Leg Raises Left;10 reps   Straight Leg Raises Limitations --  3#                 PT Education - 12/19/14 1459    Education provided Yes   Education Details lateral band walks   Person(s) Educated Patient   Methods Explanation   Comprehension Verbalized understanding          PT Short Term Goals - 12/19/14 1553    PT SHORT TERM GOAL #1   Title Pt will be independent with initial HEP   Time 2   Period Weeks   Status On-going   PT SHORT TERM GOAL #2   Title Pt will have pain reduced to at least 5/10   Time 2   Period Weeks   Status On-going   PT SHORT TERM GOAL #3   Title Pt knee flexion will increase to at least 90  degrees   Time 2   Period Weeks   Status On-going           PT Long Term Goals - 12/19/14 1553    PT LONG TERM GOAL #1   Title Pt will be independent  with advanced HEP   Time 6   Period Weeks   Status On-going   PT LONG TERM GOAL #2   Title Pt pain will decrease to at least 2/10 in order to negotiate stairs   Time 6   Period Weeks   Status On-going   PT LONG TERM GOAL #3   Title Pt will increase AAROM left kneeflexion from 2 - 115 degrees for improved mobility in order to ride in car/perform household chores   Time 6   Period Weeks   Status On-going   PT LONG TERM GOAL #4   Title FOTO will improve from 61% limitation to at least 39% for improved functional mobility   Time 6   Period Weeks   Status On-going               Plan - 12/19/14 1500    Clinical [place] presents with decreased swelling in her left knee to 39.2 cm. Added step ups and step downs today and she exhibited some difficulty but was able to complete the exercises without increased pain.  She reported that she felt worked out  but no increase in pain following todays session.         Problem List There are no active problems to display for this patient.  Starr Lake PT, DPT, LAT, ATC  12/19/2014  4:02 PM  [facility] [address] [place], [place], [address] Phone: [phone]   Fax:  [phone]                 PHYSICAL THERAPY DISCHARGE SUMMARY  Visits from Start of Care: 3  Current functional level related to goals / functional outcomes FOTO 61% limited   Remaining deficits: See goals   Education / Equipment: HEP  Plan:                                                    Patient goals were not met. Patient is being discharged due to  not returning since the last visit.  ?????        [name] PT, DPT, LAT, ATC  05/09/2015  9:06 AM

## 2015-10-25 ENCOUNTER — Ambulatory Visit: Payer: Self-pay

## 2015-10-25 ENCOUNTER — Other Ambulatory Visit: Payer: Self-pay | Admitting: Occupational Medicine

## 2015-10-25 ENCOUNTER — Ambulatory Visit: Payer: Worker's Compensation

## 2015-10-25 DIAGNOSIS — M25562 Pain in left knee: Secondary | ICD-10-CM

## 2015-11-04 MED FILL — DICLOFENAC SODIUM 1% GEL: 1 | 6 days supply | Qty: 100 | Fill #0

## 2015-12-12 MED FILL — IBUPROFEN 800 MG TABLET: 800 | 30 days supply | Qty: 90 | Fill #2

## 2016-02-03 MED FILL — IBUPROFEN 800 MG TABLET: 800 | 30 days supply | Qty: 90 | Fill #0

## 2016-02-04 ENCOUNTER — Other Ambulatory Visit (HOSPITAL_COMMUNITY): Payer: Self-pay | Admitting: Orthopedic Surgery

## 2016-02-04 DIAGNOSIS — M25562 Pain in left knee: Secondary | ICD-10-CM

## 2016-02-10 ENCOUNTER — Ambulatory Visit (HOSPITAL_COMMUNITY)
Admission: RE | Admit: 2016-02-10 | Discharge: 2016-02-10 | Disposition: A | Discharge status: Home / Self Care | Payer: PRIVATE HEALTH INSURANCE | Source: Ambulatory Visit | Attending: Orthopedic Surgery | Admitting: Orthopedic Surgery

## 2016-02-10 DIAGNOSIS — M1712 Unilateral primary osteoarthritis, left knee: Secondary | ICD-10-CM | POA: Diagnosis not present

## 2016-02-10 DIAGNOSIS — M23342 Other meniscus derangements, anterior horn of lateral meniscus, left knee: Secondary | ICD-10-CM | POA: Insufficient documentation

## 2016-02-10 DIAGNOSIS — M7122 Synovial cyst of popliteal space [Baker], left knee: Secondary | ICD-10-CM | POA: Insufficient documentation

## 2016-02-10 DIAGNOSIS — M25562 Pain in left knee: Secondary | ICD-10-CM | POA: Diagnosis present

## 2016-03-18 MED FILL — traMADol HCL 50 MG TABS: 50 | 30 days supply | Qty: 30 | Fill #0

## 2016-04-08 MED FILL — IBUPROFEN 800 MG TABLET: 800 | 30 days supply | Qty: 90 | Fill #0

## 2016-04-23 MED FILL — HYDROCODON-APAP 5-325: 5-325 | 10 days supply | Qty: 80 | Fill #0

## 2016-04-23 MED FILL — ASPIRIN EC 325 MG TABLET: 325 | 30 days supply | Qty: 30 | Fill #0

## 2016-04-23 MED FILL — MELOXICAM 15 MG TABLET: 15 | 83 days supply | Qty: 90 | Fill #0

## 2016-06-17 MED FILL — METHOCARBAMOL 500 MG TABLET: 500 | 20 days supply | Qty: 60 | Fill #0

## 2016-06-17 MED FILL — IBUPROFEN 800 MG TABLET: 800 | 30 days supply | Qty: 90 | Fill #0

## 2016-08-04 MED FILL — DICLOFENAC SOD EC 75 MG TAB: 75 | 30 days supply | Qty: 60 | Fill #0

## 2016-09-23 MED FILL — IBUPROFEN 800 MG TABLET: 800 | 30 days supply | Qty: 90 | Fill #0

## 2016-09-28 NOTE — H&P (Signed)
TOTAL KNEE ADMISSION H&P  Patient is being admitted for left total knee arthroplasty.  Subjective:  Chief Complaint:    Left knee primary OA / pain  HPI: [name], 55 y.o. female, has a history of pain and functional disability in the left knee due to arthritis and has failed non-surgical conservative treatments for greater than 12 weeks to includeNSAID's and/or analgesics, corticosteriod injections, viscosupplementation injections, use of assistive devices and activity modification.  Onset of symptoms was abrupt, starting October 20, 2015 years ago with gradually worsening course since that time. The patient noted no past surgery on the left knee(s).  Patient currently rates pain in the left knee(s) at 8 out of 10 with activity. Patient has night pain, worsening of pain with activity and weight bearing, pain that interferes with activities of daily living, pain with passive range of motion, crepitus and joint swelling.  Patient has evidence of periarticular osteophytes and joint space narrowing by imaging studies. There is no active infection.  Risks, benefits and expectations were discussed with the patient.  Risks including but not limited to the risk of anesthesia, blood clots, nerve damage, blood vessel damage, failure of the prosthesis, infection and up to and including death.  Patient understand the risks, benefits and expectations and wishes to proceed with surgery.   PCP: No primary care provider on file.  D/C Plans:      Home  Post-op Meds:       No Rx given  Tranexamic Acid:      To be given - IV  Decadron:      Is to be given  FYI:     ASA  Norco     Past Medical History:  Diagnosis Date  . Acute meniscal tear of left knee   . Complication of anesthesia 5/10   severe sore throat   . OA (osteoarthritis) of knee     Past Surgical History:  Procedure Laterality Date  . CESAREAN SECTION    . CHONDROPLASTY Left 10/19/2014   Procedure:  LEFT MEDIAL PETELLA CHONDROPLASTY;   Surgeon: [name], MD;  Location: [facility];  Service: Orthopedics;  Laterality: Left;  . DILATION AND CURETTAGE OF UTERUS  1996  . HYSTEROSCOPY W/D&C  05-11-2000  . KNEE ARTHROSCOPY Left 10/19/2014   Procedure: LEFT ARTHROSCOPY KNEE WITH DEBRIEDMENT;  Surgeon: [name], MD;  Location: [facility];  Service: Orthopedics;  Laterality: Left;  . KNEE ARTHROSCOPY WITH LATERAL MENISECTOMY Left 10/19/2014   Procedure: KNEE ARTHROSCOPY WITH LATERAL MENISECTOMY;  Surgeon: [name], MD;  Location: [facility];  Service: Orthopedics;  Laterality: Left;  . ROBOTIC ASSISTED TOTAL HYSTERECTOMY  02-26-2009  . TOTAL KNEE ARTHROPLASTY Right 05-26-2010  . TUBAL LIGATION      No prescriptions prior to admission.   No Known Allergies   Social History  Substance Use Topics  . Smoking status: Current Every Day Smoker    Packs/day: 0.50    Types: Cigarettes  . Smokeless tobacco: Not on file  . Alcohol use 1.2 - 1.8 oz/week    2 - 3 Cans of beer per week     Comment: social use       Review of Systems  Constitutional: Negative.   HENT: Negative.   Eyes: Negative.   Respiratory: Negative.   Cardiovascular: Negative.   Gastrointestinal: Negative.   Genitourinary: Negative.   Musculoskeletal: Positive for joint pain.  Skin: Negative.   Neurological: Negative.  Endo/Heme/Allergies: Negative.   Psychiatric/Behavioral: Negative.     Objective:  Physical Exam  Constitutional: She is oriented to person, place, and time. She appears well-developed.  HENT:  Head: Normocephalic.  Eyes: Pupils are equal, round, and reactive to light.  Neck: Neck supple. No JVD present. No tracheal deviation present. No thyromegaly present.  Cardiovascular: Normal rate, regular rhythm, normal heart sounds and intact distal pulses.   Respiratory: Effort normal and breath sounds normal. No respiratory distress. She has no wheezes.  GI: Soft. There is no  tenderness. There is no guarding.  Musculoskeletal:       Left knee: She exhibits decreased range of motion, swelling and bony tenderness. She exhibits no ecchymosis, no deformity, no laceration and no erythema. Tenderness found.  Lymphadenopathy:    She has no cervical adenopathy.  Neurological: She is alert and oriented to person, place, and time.  Skin: Skin is warm and dry.  Psychiatric: She has a normal mood and affect.     Labs:  Estimated body mass index is 31.35 kg/m as calculated from the following:   Height as of 10/19/14: 5\' 3"  (1.6 m).   Weight as of 10/19/14: 80.3 kg (177 lb).   Imaging Review Plain radiographs demonstrate moderate degenerative joint disease of the left knee(s). The overall alignment is mild valgus. The bone quality appears to be good for age and reported activity level.  Assessment/Plan:  End stage arthritis, left knee   The patient history, physical examination, clinical judgment of the provider and imaging studies are consistent with end stage degenerative joint disease of the left knee(s) and total knee arthroplasty is deemed medically necessary. The treatment options including medical management, injection therapy arthroscopy and arthroplasty were discussed at length. The risks and benefits of total knee arthroplasty were presented and reviewed. The risks due to aseptic loosening, infection, stiffness, patella tracking problems, thromboembolic complications and other imponderables were discussed. The patient acknowledged the explanation, agreed to proceed with the plan and consent was signed. Patient is being admitted for inpatient treatment for surgery, pain control, PT, OT, prophylactic antibiotics, VTE prophylaxis, progressive ambulation and ADL's and discharge planning. The patient is planning to be discharged home.     Maureen Pugh Kashten Gowin   PA-C  09/28/2016, 4:20 PM

## 2016-10-06 ENCOUNTER — Other Ambulatory Visit (HOSPITAL_COMMUNITY): Payer: Self-pay | Admitting: *Deleted

## 2016-10-06 NOTE — Patient Instructions (Addendum)
[name]  10/06/2016   Your procedure is scheduled on: 10-13-16  Report to [facility]  Entrance take [facility]  elevators to 3rd floor to  [place] at 600 AM.  Call this number if you have problems the morning of surgery [phone]   Remember: ONLY 1 PERSON MAY GO WITH YOU TO SHORT STAY TO GET  READY MORNING OF [place].  Do not eat food or drink liquids :After Midnight.     Take these medicines the morning of surgery with A SIP OF WATER: none                               You may not have any metal on your body including hair pins and              piercings  Do not wear jewelry, make-up, lotions, powders or perfumes, deodorant             Do not wear nail polish.  Do not shave  48 hours prior to surgery.              Men may shave face and neck.   Do not bring valuables to the hospital. [place].  Contacts, dentures or bridgework may not be worn into surgery.  Leave suitcase in the car. After surgery it may be brought to your room.                  Please read over the following fact sheets you were given: _____________________________________________________________________             [facility] - Preparing for Surgery Before surgery, you can play an important role.  Because skin is not sterile, your skin needs to be as free of germs as possible.  You can reduce the number of germs on your skin by washing with CHG (chlorahexidine gluconate) soap before surgery.  CHG is an antiseptic cleaner which kills germs and bonds with the skin to continue killing germs even after washing. Please DO NOT use if you have an allergy to CHG or antibacterial soaps.  If your skin becomes reddened/irritated stop using the CHG and inform your nurse when you arrive at Short Stay. Do not shave (including legs and underarms) for at least 48 hours prior to the first CHG shower.  You may shave your  face/neck. Please follow these instructions carefully:  1.  Shower with CHG Soap the night before surgery and the  morning of Surgery.  2.  If you choose to wash your hair, wash your hair first as usual with your  normal  shampoo.  3.  After you shampoo, rinse your hair and body thoroughly to remove the  shampoo.                           4.  Use CHG as you would any other liquid soap.  You can apply chg directly  to the skin and wash                       Gently with a scrungie or clean washcloth.  5.  Apply the CHG Soap to  your body ONLY FROM THE NECK DOWN.   Do not use on face/ open                           Wound or open sores. Avoid contact with eyes, ears mouth and genitals (private parts).                       Wash face,  Genitals (private parts) with your normal soap.             6.  Wash thoroughly, paying special attention to the area where your surgery  will be performed.  7.  Thoroughly rinse your body with warm water from the neck down.  8.  DO NOT shower/wash with your normal soap after using and rinsing off  the CHG Soap.                9.  Pat yourself dry with a clean towel.            10.  Wear clean pajamas.            11.  Place clean sheets on your bed the night of your first shower and do not  sleep with pets. Day of Surgery : Do not apply any lotions/deodorants the morning of surgery.  Please wear clean clothes to the hospital/surgery center.  FAILURE TO FOLLOW THESE INSTRUCTIONS MAY RESULT IN THE CANCELLATION OF YOUR SURGERY PATIENT SIGNATURE_________________________________  NURSE SIGNATURE__________________________________  ________________________________________________________________________   Adam Phenix  An incentive spirometer is a tool that can help keep your lungs clear and active. This tool measures how well you are filling your lungs with each breath. Taking long deep breaths may help reverse or decrease the chance of developing breathing  (pulmonary) problems (especially infection) following:  A long period of time when you are unable to move or be active. BEFORE THE PROCEDURE   If the spirometer includes an indicator to show your best effort, your nurse or respiratory therapist will set it to a desired goal.  If possible, sit up straight or lean slightly forward. Try not to slouch.  Hold the incentive spirometer in an upright position. INSTRUCTIONS FOR USE  1. Sit on the edge of your bed if possible, or sit up as far as you can in bed or on a chair. 2. Hold the incentive spirometer in an upright position. 3. Breathe out normally. 4. Place the mouthpiece in your mouth and seal your lips tightly around it. 5. Breathe in slowly and as deeply as possible, raising the piston or the ball toward the top of the column. 6. Hold your breath for 3-5 seconds or for as long as possible. Allow the piston or ball to fall to the bottom of the column. 7. Remove the mouthpiece from your mouth and breathe out normally. 8. Rest for a few seconds and repeat Steps 1 through 7 at least 10 times every 1-2 hours when you are awake. Take your time and take a few normal breaths between deep breaths. 9. The spirometer may include an indicator to show your best effort. Use the indicator as a goal to work toward during each repetition. 10. After each set of 10 deep breaths, practice coughing to be sure your lungs are clear. If you have an incision (the cut made at the time of surgery), support your incision when coughing by placing a pillow or rolled up towels firmly against  it. Once you are able to get out of bed, walk around indoors and cough well. You may stop using the incentive spirometer when instructed by your caregiver.  RISKS AND COMPLICATIONS  Take your time so you do not get dizzy or light-headed.  If you are in pain, you may need to take or ask for pain medication before doing incentive spirometry. It is harder to take a deep breath if you  are having pain. AFTER USE  Rest and breathe slowly and easily.  It can be helpful to keep track of a log of your progress. Your caregiver can provide you with a simple table to help with this. If you are using the spirometer at home, follow these instructions: [place] IF:   You are having difficultly using the spirometer.  You have trouble using the spirometer as often as instructed.  Your pain medication is not giving enough relief while using the spirometer.  You develop fever of 100.5 F (38.1 C) or higher. SEEK IMMEDIATE MEDICAL CARE IF:   You cough up bloody sputum that had not been present before.  You develop fever of 102 F (38.9 C) or greater.  You develop worsening pain at or near the incision site. MAKE SURE YOU:   Understand these instructions.  Will watch your condition.  Will get help right away if you are not doing well or get worse. Document Released: 02/08/2007 Document Revised: 12/21/2011 Document Reviewed: 04/11/2007 ExitCare Patient Information 2014 ExitCare, [place].   ________________________________________________________________________  WHAT IS A BLOOD TRANSFUSION? Blood Transfusion Information  A transfusion is the replacement of blood or some of its parts. Blood is made up of multiple cells which provide different functions.  Red blood cells carry oxygen and are used for blood loss replacement.  White blood cells fight against infection.  Platelets control bleeding.  Plasma helps clot blood.  Other blood products are available for specialized needs, such as hemophilia or other clotting disorders. BEFORE THE TRANSFUSION  Who gives blood for transfusions?   Healthy volunteers who are fully evaluated to make sure their blood is safe. This is blood bank blood. Transfusion therapy is the safest it has ever been in the practice of medicine. Before blood is taken from a donor, a complete history is taken to make sure that person has  no history of diseases nor engages in risky social behavior (examples are intravenous drug use or sexual activity with multiple partners). The donor's travel history is screened to minimize risk of transmitting infections, such as malaria. The donated blood is tested for signs of infectious diseases, such as HIV and hepatitis. The blood is then tested to be sure it is compatible with you in order to minimize the chance of a transfusion reaction. If you or a relative donates blood, this is often done in anticipation of surgery and is not appropriate for emergency situations. It takes many days to process the donated blood. RISKS AND COMPLICATIONS Although transfusion therapy is very safe and saves many lives, the main dangers of transfusion include:   Getting an infectious disease.  Developing a transfusion reaction. This is an allergic reaction to something in the blood you were given. Every precaution is taken to prevent this. The decision to have a blood transfusion has been considered carefully by your caregiver before blood is given. Blood is not given unless the benefits outweigh the risks. AFTER THE TRANSFUSION  Right after receiving a blood transfusion, you will usually feel much better and more  energetic. This is especially true if your red blood cells have gotten low (anemic). The transfusion raises the level of the red blood cells which carry oxygen, and this usually causes an energy increase.  The nurse administering the transfusion will monitor you carefully for complications. HOME CARE INSTRUCTIONS  No special instructions are needed after a transfusion. You may find your energy is better. Speak with your caregiver about any limitations on activity for underlying diseases you may have. SEEK MEDICAL CARE IF:   Your condition is not improving after your transfusion.  You develop redness or irritation at the intravenous (IV) site. SEEK IMMEDIATE MEDICAL CARE IF:  Any of the following  symptoms occur over the next 12 hours:  Shaking chills.  You have a temperature by mouth above 102 F (38.9 C), not controlled by medicine.  Chest, back, or muscle pain.  People around you feel you are not acting correctly or are confused.  Shortness of breath or difficulty breathing.  Dizziness and fainting.  You get a rash or develop hives.  You have a decrease in urine output.  Your urine turns a dark color or changes to pink, red, or brown. Any of the following symptoms occur over the next 10 days:  You have a temperature by mouth above 102 F (38.9 C), not controlled by medicine.  Shortness of breath.  Weakness after normal activity.  The white part of the eye turns yellow (jaundice).  You have a decrease in the amount of urine or are urinating less often.  Your urine turns a dark color or changes to pink, red, or brown. Document Released: 09/25/2000 Document Revised: 12/21/2011 Document Reviewed: 05/14/2008 [facility] Patient Information 2014 [place], [place].  _______________________________________________________________________

## 2016-10-07 ENCOUNTER — Encounter (HOSPITAL_COMMUNITY)
Admission: RE | Admit: 2016-10-07 | Discharge: 2016-10-07 | Disposition: A | Discharge status: Home / Self Care | Payer: PRIVATE HEALTH INSURANCE | Source: Ambulatory Visit | Attending: Orthopedic Surgery | Admitting: Orthopedic Surgery

## 2016-10-07 ENCOUNTER — Encounter (HOSPITAL_COMMUNITY): Payer: Self-pay

## 2016-10-07 DIAGNOSIS — Z0183 Encounter for blood typing: Secondary | ICD-10-CM | POA: Diagnosis not present

## 2016-10-07 DIAGNOSIS — M1712 Unilateral primary osteoarthritis, left knee: Secondary | ICD-10-CM | POA: Diagnosis not present

## 2016-10-07 DIAGNOSIS — Z01812 Encounter for preprocedural laboratory examination: Secondary | ICD-10-CM | POA: Insufficient documentation

## 2016-10-07 HISTORY — DX: Anesthesia of skin: R20.0

## 2016-10-07 LAB — CBC
HEMATOCRIT: 37.4 % (ref 36.0–46.0)
HEMOGLOBIN: 12.6 g/dL (ref 12.0–15.0)
MCH: 33.2 pg (ref 26.0–34.0)
MCHC: 33.7 g/dL (ref 30.0–36.0)
MCV: 98.7 fL (ref 78.0–100.0)
Platelets: 188 10*3/uL (ref 150–400)
RBC: 3.79 MIL/uL — ABNORMAL LOW (ref 3.87–5.11)
RDW: 12.5 % (ref 11.5–15.5)
WBC: 5.3 10*3/uL (ref 4.0–10.5)

## 2016-10-07 LAB — SURGICAL PCR SCREEN
MRSA, PCR: NEGATIVE
STAPHYLOCOCCUS AUREUS: NEGATIVE

## 2016-10-13 ENCOUNTER — Encounter (HOSPITAL_COMMUNITY)
Admission: RE | Disposition: A | Discharge status: Home / Self Care | Payer: Self-pay | Source: Ambulatory Visit | Attending: Orthopedic Surgery

## 2016-10-13 ENCOUNTER — Inpatient Hospital Stay (HOSPITAL_COMMUNITY): Payer: PRIVATE HEALTH INSURANCE | Admitting: Anesthesiology

## 2016-10-13 ENCOUNTER — Inpatient Hospital Stay (HOSPITAL_COMMUNITY)
Admission: RE | Admit: 2016-10-13 | Discharge: 2016-10-15 | DRG: 470 | Disposition: A | Discharge status: Home / Self Care | Payer: PRIVATE HEALTH INSURANCE | Source: Ambulatory Visit | Attending: Orthopedic Surgery | Admitting: Orthopedic Surgery

## 2016-10-13 ENCOUNTER — Encounter (HOSPITAL_COMMUNITY): Payer: Self-pay | Admitting: *Deleted

## 2016-10-13 DIAGNOSIS — F17210 Nicotine dependence, cigarettes, uncomplicated: Secondary | ICD-10-CM | POA: Diagnosis not present

## 2016-10-13 DIAGNOSIS — E669 Obesity, unspecified: Secondary | ICD-10-CM | POA: Diagnosis present

## 2016-10-13 DIAGNOSIS — Z96659 Presence of unspecified artificial knee joint: Secondary | ICD-10-CM

## 2016-10-13 DIAGNOSIS — M21062 Valgus deformity, not elsewhere classified, left knee: Secondary | ICD-10-CM | POA: Diagnosis present

## 2016-10-13 DIAGNOSIS — M659 Synovitis and tenosynovitis, unspecified: Secondary | ICD-10-CM | POA: Diagnosis present

## 2016-10-13 DIAGNOSIS — Z90710 Acquired absence of both cervix and uterus: Secondary | ICD-10-CM

## 2016-10-13 DIAGNOSIS — M1712 Unilateral primary osteoarthritis, left knee: Secondary | ICD-10-CM | POA: Diagnosis not present

## 2016-10-13 DIAGNOSIS — Z96652 Presence of left artificial knee joint: Secondary | ICD-10-CM

## 2016-10-13 DIAGNOSIS — Z6830 Body mass index (BMI) 30.0-30.9, adult: Secondary | ICD-10-CM

## 2016-10-13 DIAGNOSIS — Z886 Allergy status to analgesic agent status: Secondary | ICD-10-CM

## 2016-10-13 HISTORY — PX: TOTAL KNEE ARTHROPLASTY: SHX125

## 2016-10-13 LAB — TYPE AND SCREEN
ABO/RH(D): O POS
Antibody Screen: NEGATIVE

## 2016-10-13 SURGERY — ARTHROPLASTY, KNEE, TOTAL
Anesthesia: Spinal | Site: Knee | Laterality: Left

## 2016-10-13 MED ORDER — PROPOFOL 10 MG/ML IV BOLUS
INTRAVENOUS | Status: AC
  Filled 2016-10-13: qty 20

## 2016-10-13 MED ORDER — ONDANSETRON HCL 4 MG/2ML IJ SOLN: 4.0000 mg | Freq: Four times a day (QID) | INTRAMUSCULAR | Status: DC | PRN

## 2016-10-13 MED ORDER — SODIUM CHLORIDE 0.9 % IR SOLN
Status: DC | PRN
  Administered 2016-10-13: 1000 mL

## 2016-10-13 MED ORDER — CELECOXIB 200 MG PO CAPS
200.0000 mg | ORAL_CAPSULE | Freq: Two times a day (BID) | ORAL | Status: DC
  Administered 2016-10-13 – 2016-10-15 (×4): 200 mg via ORAL
  Filled 2016-10-13 (×6): qty 1

## 2016-10-13 MED ORDER — BUPIVACAINE HCL (PF) 0.25 % IJ SOLN
INTRAMUSCULAR | Status: DC | PRN
  Administered 2016-10-13: 30 mL

## 2016-10-13 MED ORDER — DOCUSATE SODIUM 100 MG PO CAPS
100.0000 mg | ORAL_CAPSULE | Freq: Two times a day (BID) | ORAL | Status: DC
  Administered 2016-10-13 – 2016-10-15 (×2): 100 mg via ORAL
  Filled 2016-10-13 (×6): qty 1

## 2016-10-13 MED ORDER — MENTHOL 3 MG MT LOZG
1.0000 | LOZENGE | OROMUCOSAL | Status: DC | PRN
  Filled 2016-10-13: qty 9

## 2016-10-13 MED ORDER — METHOCARBAMOL 1000 MG/10ML IJ SOLN
500.0000 mg | Freq: Four times a day (QID) | INTRAVENOUS | Status: DC | PRN
  Administered 2016-10-13: 500 mg via INTRAVENOUS
  Filled 2016-10-13: qty 5
  Filled 2016-10-13: qty 550

## 2016-10-13 MED ORDER — CEFAZOLIN SODIUM-DEXTROSE 2-4 GM/100ML-% IV SOLN
2.0000 g | INTRAVENOUS | Status: AC
  Administered 2016-10-13: 2 g via INTRAVENOUS

## 2016-10-13 MED ORDER — EPINEPHRINE PF 1 MG/ML IJ SOLN
INTRAMUSCULAR | Status: AC
  Filled 2016-10-13: qty 1

## 2016-10-13 MED ORDER — PROPOFOL 500 MG/50ML IV EMUL
INTRAVENOUS | Status: DC | PRN
  Administered 2016-10-13: 100 ug/kg/min via INTRAVENOUS

## 2016-10-13 MED ORDER — BISACODYL 10 MG RE SUPP
10.0000 mg | Freq: Every day | RECTAL | Status: DC | PRN
  Filled 2016-10-13: qty 1

## 2016-10-13 MED ORDER — METOCLOPRAMIDE HCL 5 MG/ML IJ SOLN: 5.0000 mg | Freq: Three times a day (TID) | INTRAMUSCULAR | Status: DC | PRN

## 2016-10-13 MED ORDER — PROPOFOL 10 MG/ML IV BOLUS
INTRAVENOUS | Status: AC
  Filled 2016-10-13: qty 40

## 2016-10-13 MED ORDER — MEPERIDINE HCL 50 MG/ML IJ SOLN: 6.2500 mg | INTRAMUSCULAR | Status: DC | PRN

## 2016-10-13 MED ORDER — TRANEXAMIC ACID 1000 MG/10ML IV SOLN
1000.0000 mg | INTRAVENOUS | Status: AC
  Administered 2016-10-13: 1000 mg via INTRAVENOUS
  Filled 2016-10-13: qty 1100

## 2016-10-13 MED ORDER — FENTANYL CITRATE (PF) 100 MCG/2ML IJ SOLN
INTRAMUSCULAR | Status: AC
  Filled 2016-10-13: qty 2

## 2016-10-13 MED ORDER — ONDANSETRON HCL 4 MG/2ML IJ SOLN
INTRAMUSCULAR | Status: DC | PRN
  Administered 2016-10-13: 4 mg via INTRAVENOUS

## 2016-10-13 MED ORDER — MIDAZOLAM HCL 5 MG/5ML IJ SOLN
INTRAMUSCULAR | Status: DC | PRN
  Administered 2016-10-13: 2 mg via INTRAVENOUS

## 2016-10-13 MED ORDER — CEFAZOLIN SODIUM-DEXTROSE 2-4 GM/100ML-% IV SOLN
2.0000 g | Freq: Four times a day (QID) | INTRAVENOUS | Status: AC
  Administered 2016-10-13 (×2): 2 g via INTRAVENOUS
  Filled 2016-10-13 (×4): qty 100

## 2016-10-13 MED ORDER — ASPIRIN 81 MG PO CHEW
81.0000 mg | CHEWABLE_TABLET | Freq: Two times a day (BID) | ORAL | Status: DC
  Administered 2016-10-13 – 2016-10-15 (×4): 81 mg via ORAL
  Filled 2016-10-13 (×4): qty 1

## 2016-10-13 MED ORDER — KETOROLAC TROMETHAMINE 30 MG/ML IJ SOLN
INTRAMUSCULAR | Status: AC
  Filled 2016-10-13: qty 1

## 2016-10-13 MED ORDER — BUPIVACAINE-EPINEPHRINE (PF) 0.5% -1:200000 IJ SOLN
INTRAMUSCULAR | Status: DC | PRN
  Administered 2016-10-13: 30 mL via PERINEURAL

## 2016-10-13 MED ORDER — HYDROCODONE-ACETAMINOPHEN 7.5-325 MG PO TABS
1.0000 | ORAL_TABLET | ORAL | Status: DC
  Administered 2016-10-13: 1 via ORAL
  Administered 2016-10-13 – 2016-10-14 (×8): 2 via ORAL
  Administered 2016-10-15 (×3): 1 via ORAL
  Administered 2016-10-15: 2 via ORAL
  Administered 2016-10-15: 1 via ORAL
  Filled 2016-10-13 (×9): qty 2
  Filled 2016-10-13: qty 1
  Filled 2016-10-13 (×2): qty 2

## 2016-10-13 MED ORDER — DEXAMETHASONE SODIUM PHOSPHATE 10 MG/ML IJ SOLN
INTRAMUSCULAR | Status: AC
  Filled 2016-10-13: qty 1

## 2016-10-13 MED ORDER — 0.9 % SODIUM CHLORIDE (POUR BTL) OPTIME
TOPICAL | Status: DC | PRN
  Administered 2016-10-13: 1000 mL

## 2016-10-13 MED ORDER — HYDROCODONE-ACETAMINOPHEN 7.5-325 MG PO TABS
ORAL_TABLET | ORAL | Status: AC
  Filled 2016-10-13: qty 1

## 2016-10-13 MED ORDER — FERROUS SULFATE 325 (65 FE) MG PO TABS
325.0000 mg | ORAL_TABLET | Freq: Three times a day (TID) | ORAL | Status: DC
  Administered 2016-10-13 – 2016-10-15 (×5): 325 mg via ORAL
  Filled 2016-10-13 (×9): qty 1

## 2016-10-13 MED ORDER — BUPIVACAINE HCL (PF) 0.75 % IJ SOLN
INTRAMUSCULAR | Status: DC | PRN
  Administered 2016-10-13: 15 mg via INTRATHECAL

## 2016-10-13 MED ORDER — FENTANYL CITRATE (PF) 100 MCG/2ML IJ SOLN
INTRAMUSCULAR | Status: DC | PRN
  Administered 2016-10-13 (×2): 50 ug via INTRAVENOUS

## 2016-10-13 MED ORDER — DIPHENHYDRAMINE HCL 25 MG PO CAPS: 25.0000 mg | ORAL_CAPSULE | Freq: Four times a day (QID) | ORAL | Status: DC | PRN

## 2016-10-13 MED ORDER — HYDROMORPHONE HCL 1 MG/ML IJ SOLN
0.5000 mg | INTRAMUSCULAR | Status: DC | PRN
  Administered 2016-10-13 (×2): 1 mg via INTRAVENOUS
  Administered 2016-10-13: 2 mg via INTRAVENOUS
  Administered 2016-10-14: 0.5 mg via INTRAVENOUS
  Administered 2016-10-14 – 2016-10-15 (×5): 1 mg via INTRAVENOUS
  Filled 2016-10-13: qty 1
  Filled 2016-10-13: qty 2
  Filled 2016-10-13 (×7): qty 1

## 2016-10-13 MED ORDER — BUPIVACAINE HCL (PF) 0.5 % IJ SOLN
INTRAMUSCULAR | Status: AC
  Filled 2016-10-13: qty 30

## 2016-10-13 MED ORDER — ALUM & MAG HYDROXIDE-SIMETH 200-200-20 MG/5ML PO SUSP
30.0000 mL | ORAL | Status: DC | PRN
  Filled 2016-10-13: qty 30

## 2016-10-13 MED ORDER — POLYETHYLENE GLYCOL 3350 17 G PO PACK
17.0000 g | PACK | Freq: Two times a day (BID) | ORAL | Status: DC
  Administered 2016-10-13: 17 g via ORAL
  Filled 2016-10-13 (×6): qty 1

## 2016-10-13 MED ORDER — DEXAMETHASONE SODIUM PHOSPHATE 10 MG/ML IJ SOLN
10.0000 mg | Freq: Once | INTRAMUSCULAR | Status: AC
  Administered 2016-10-14: 10 mg via INTRAVENOUS
  Filled 2016-10-13: qty 1

## 2016-10-13 MED ORDER — DEXAMETHASONE SODIUM PHOSPHATE 10 MG/ML IJ SOLN
10.0000 mg | Freq: Once | INTRAMUSCULAR | Status: AC
  Administered 2016-10-13: 10 mg via INTRAVENOUS

## 2016-10-13 MED ORDER — LACTATED RINGERS IV SOLN
INTRAVENOUS | Status: DC
  Administered 2016-10-13: 1000 mL via INTRAVENOUS

## 2016-10-13 MED ORDER — LACTATED RINGERS IV SOLN
INTRAVENOUS | Status: DC | PRN
  Administered 2016-10-13 (×2): via INTRAVENOUS

## 2016-10-13 MED ORDER — METOCLOPRAMIDE HCL 5 MG PO TABS
5.0000 mg | ORAL_TABLET | Freq: Three times a day (TID) | ORAL | Status: DC | PRN
  Filled 2016-10-13: qty 2

## 2016-10-13 MED ORDER — METHOCARBAMOL 500 MG PO TABS
500.0000 mg | ORAL_TABLET | Freq: Four times a day (QID) | ORAL | Status: DC | PRN
  Administered 2016-10-14 – 2016-10-15 (×2): 500 mg via ORAL
  Filled 2016-10-13 (×3): qty 1

## 2016-10-13 MED ORDER — SODIUM CHLORIDE 0.9 % IJ SOLN
INTRAMUSCULAR | Status: DC | PRN
  Administered 2016-10-13: 30 mL

## 2016-10-13 MED ORDER — PHENOL 1.4 % MT LIQD
1.0000 | OROMUCOSAL | Status: DC | PRN
  Filled 2016-10-13: qty 177

## 2016-10-13 MED ORDER — FENTANYL CITRATE (PF) 100 MCG/2ML IJ SOLN: 25.0000 ug | INTRAMUSCULAR | Status: DC | PRN

## 2016-10-13 MED ORDER — KETOROLAC TROMETHAMINE 30 MG/ML IJ SOLN
INTRAMUSCULAR | Status: DC | PRN
  Administered 2016-10-13: 30 mg

## 2016-10-13 MED ORDER — ONDANSETRON HCL 4 MG PO TABS
4.0000 mg | ORAL_TABLET | Freq: Four times a day (QID) | ORAL | Status: DC | PRN
  Filled 2016-10-13: qty 1

## 2016-10-13 MED ORDER — BUPIVACAINE HCL (PF) 0.25 % IJ SOLN
INTRAMUSCULAR | Status: AC
  Filled 2016-10-13: qty 30

## 2016-10-13 MED ORDER — MIDAZOLAM HCL 2 MG/2ML IJ SOLN
INTRAMUSCULAR | Status: AC
  Filled 2016-10-13: qty 2

## 2016-10-13 MED ORDER — SODIUM CHLORIDE 0.9 % IJ SOLN
INTRAMUSCULAR | Status: AC
  Filled 2016-10-13: qty 50

## 2016-10-13 MED ORDER — MAGNESIUM CITRATE PO SOLN
1.0000 | Freq: Once | ORAL | Status: DC | PRN
  Filled 2016-10-13: qty 296

## 2016-10-13 MED ORDER — METOCLOPRAMIDE HCL 5 MG/ML IJ SOLN: 10.0000 mg | Freq: Once | INTRAMUSCULAR | Status: DC | PRN

## 2016-10-13 MED ORDER — ONDANSETRON HCL 4 MG/2ML IJ SOLN
INTRAMUSCULAR | Status: AC
  Filled 2016-10-13: qty 2

## 2016-10-13 MED ORDER — SODIUM CHLORIDE 0.9 % IV SOLN
INTRAVENOUS | Status: DC
Start: 2016-10-13 — End: 2016-10-15
  Administered 2016-10-13 – 2016-10-14 (×2): via INTRAVENOUS
  Filled 2016-10-13 (×8): qty 1000

## 2016-10-13 SURGICAL SUPPLY — 46 items
ADH SKN CLS APL DERMABOND .7 (GAUZE/BANDAGES/DRESSINGS) ×1
BAG DECANTER FOR FLEXI CONT (MISCELLANEOUS) IMPLANT
BAG SPEC THK2 15X12 ZIP CLS (MISCELLANEOUS)
BAG ZIPLOCK 12X15 (MISCELLANEOUS) IMPLANT
BANDAGE ACE 6X5 VEL STRL LF (GAUZE/BANDAGES/DRESSINGS) ×2 IMPLANT
BLADE SAW SGTL 13.0X1.19X90.0M (BLADE) ×2 IMPLANT
BOWL SMART MIX CTS (DISPOSABLE) ×2 IMPLANT
CAPT KNEE TOTAL 3 ATTUNE ×1 IMPLANT
CEMENT HV SMART SET (Cement) ×2 IMPLANT
CLOTH BEACON ORANGE TIMEOUT ST (SAFETY) ×2 IMPLANT
CUFF TOURN SGL QUICK 34 (TOURNIQUET CUFF) ×2
CUFF TRNQT CYL 34X4X40X1 (TOURNIQUET CUFF) ×1 IMPLANT
DECANTER SPIKE VIAL GLASS SM (MISCELLANEOUS) ×2 IMPLANT
DERMABOND ADVANCED (GAUZE/BANDAGES/DRESSINGS) ×1
DERMABOND ADVANCED .7 DNX12 (GAUZE/BANDAGES/DRESSINGS) ×1 IMPLANT
DRAPE U-SHAPE 47X51 STRL (DRAPES) ×2 IMPLANT
DRESSING AQUACEL AG SP 3.5X10 (GAUZE/BANDAGES/DRESSINGS) ×1 IMPLANT
DRSG AQUACEL AG SP 3.5X10 (GAUZE/BANDAGES/DRESSINGS) ×2
DURAPREP 26ML APPLICATOR (WOUND CARE) ×4 IMPLANT
ELECT REM PT RETURN 9FT ADLT (ELECTROSURGICAL) ×2
ELECTRODE REM PT RTRN 9FT ADLT (ELECTROSURGICAL) ×1 IMPLANT
GLOVE BIOGEL M 7.0 STRL (GLOVE) ×2 IMPLANT
GLOVE BIOGEL PI IND STRL 7.5 (GLOVE) ×1 IMPLANT
GLOVE BIOGEL PI IND STRL 8.5 (GLOVE) ×1 IMPLANT
GLOVE BIOGEL PI INDICATOR 7.5 (GLOVE) ×5
GLOVE BIOGEL PI INDICATOR 8.5 (GLOVE)
GLOVE ECLIPSE 8.0 STRL XLNG CF (GLOVE) ×1 IMPLANT
GLOVE ORTHO TXT STRL SZ7.5 (GLOVE) ×3 IMPLANT
GOWN STRL REUS W/TWL LRG LVL3 (GOWN DISPOSABLE) ×4 IMPLANT
GOWN STRL REUS W/TWL XL LVL3 (GOWN DISPOSABLE) ×2 IMPLANT
HANDPIECE INTERPULSE COAX TIP (DISPOSABLE) ×2
MANIFOLD NEPTUNE II (INSTRUMENTS) ×2 IMPLANT
PACK TOTAL KNEE CUSTOM (KITS) ×2 IMPLANT
POSITIONER SURGICAL ARM (MISCELLANEOUS) ×2 IMPLANT
SET HNDPC FAN SPRY TIP SCT (DISPOSABLE) ×1 IMPLANT
SET PAD KNEE POSITIONER (MISCELLANEOUS) ×2 IMPLANT
SUT MNCRL AB 4-0 PS2 18 (SUTURE) ×2 IMPLANT
SUT VIC AB 1 CT1 36 (SUTURE) ×2 IMPLANT
SUT VIC AB 2-0 CT1 27 (SUTURE) ×6
SUT VIC AB 2-0 CT1 TAPERPNT 27 (SUTURE) ×3 IMPLANT
SUT VLOC 180 0 24IN GS25 (SUTURE) ×2 IMPLANT
SYR 50ML LL SCALE MARK (SYRINGE) ×2 IMPLANT
TRAY FOLEY W/METER SILVER 16FR (SET/KITS/TRAYS/PACK) ×2 IMPLANT
WATER STERILE IRR 1500ML POUR (IV SOLUTION) ×3 IMPLANT
WRAP KNEE MAXI GEL POST OP (GAUZE/BANDAGES/DRESSINGS) ×2 IMPLANT
YANKAUER SUCT BULB TIP 10FT TU (MISCELLANEOUS) ×2 IMPLANT

## 2016-10-13 NOTE — Transfer of Care (Signed)
Immediate Anesthesia Transfer of Care Note  Patient: [name]  Procedure(s) Performed: Procedure(s): LEFT TOTAL KNEE ARTHROPLASTY (Left)  Patient Location: PACU  Anesthesia Type:MAC and Spinal  Level of Consciousness:  sedated, patient cooperative and responds to stimulation  Airway & Oxygen Therapy:Patient Spontanous Breathing and Patient connected to face mask oxgen  Post-op Assessment:  Report given to PACU RN and Post -op Vital signs reviewed and stable  Post vital signs:  Reviewed and stable  Last Vitals:  Vitals:   10/13/16 0557  BP: (!) 145/99  Pulse: 64  Resp: 16  Temp: 00.1 C    Complications: No apparent anesthesia complications

## 2016-10-13 NOTE — Anesthesia Procedure Notes (Addendum)
Anesthesia Regional Block:  Adductor canal block  Pre-Anesthetic Checklist: ,, timeout performed, Correct Patient, Correct Site, Correct Laterality, Correct Procedure, Correct Position, site marked, Risks and benefits discussed,  Surgical consent,  Pre-op evaluation,  At surgeon's request and post-op pain management  Laterality: Left and Lower  Prep: Maximum Sterile Barrier Precautions used, chloraprep       Needles:  Injection technique: Single-shot  Needle Type: Echogenic Stimulator Needle     Needle Length: 10cm 10 cm Needle Gauge: 21 G    Additional Needles:  Procedures: ultrasound guided (picture in chart) Adductor canal block Narrative:  Start time: 10/13/2016 8:10 AM End time: 10/13/2016 8:17 AM Injection made incrementally with aspirations every 5 mL.  Performed by: Personally  Anesthesiologist: Montez Hageman  Additional Notes: Risks, benefits and alternative to block explained extensively.  Patient tolerated procedure well, without complications.

## 2016-10-13 NOTE — Addendum Note (Signed)
Addendum  created 10/13/16 1149 by Lind Covert, CRNA   Charge Capture section accepted, Visit diagnoses modified

## 2016-10-13 NOTE — Anesthesia Postprocedure Evaluation (Addendum)
Anesthesia Post Note  Patient: [name]  Procedure(s) Performed: Procedure(s) (LRB): LEFT TOTAL KNEE ARTHROPLASTY (Left)  Patient location during evaluation: PACU Anesthesia Type: Spinal Level of consciousness: awake and alert Pain management: pain level controlled Vital Signs Assessment: post-procedure vital signs reviewed and stable Respiratory status: spontaneous breathing and respiratory function stable Cardiovascular status: blood pressure returned to baseline and stable Postop Assessment: no headache, no backache and spinal receding Anesthetic complications: no       Last Vitals:  Vitals:   10/13/16 1045 10/13/16 1100  BP: 93/62 107/77  Pulse: 75 63  Resp: 14 12  Temp: 36.8 C     Last Pain:  Vitals:   10/13/16 1100  TempSrc:   PainSc: 0-No pain                 Montez Hageman

## 2016-10-13 NOTE — Interval H&P Note (Signed)
History and Physical Interval Note:  10/13/2016 7:08 AM  [name]  has presented today for surgery, with the diagnosis of Left knee OA  The various methods of treatment have been discussed with the patient and family. After consideration of risks, benefits and other options for treatment, the patient has consented to  Procedure(s): LEFT TOTAL KNEE ARTHROPLASTY (Left) as a surgical intervention .  The patient's history has been reviewed, patient examined, no change in status, stable for surgery.  I have reviewed the patient's chart and labs.  Questions were answered to the patient's satisfaction.     [name]

## 2016-10-13 NOTE — Op Note (Signed)
NAME:  [name]                      MEDICAL RECORD NO.:  [id number]                             FACILITY:  [facility]      PHYSICIAN:  Pietro Cassis. Alvan Dame, M.D.  DATE OF BIRTH:  1961-08-09      DATE OF PROCEDURE:  10/13/2016                                     OPERATIVE REPORT         PREOPERATIVE DIAGNOSIS:  Left knee osteoarthritis.      POSTOPERATIVE DIAGNOSIS:  Left knee osteoarthritis.      FINDINGS:  The patient was noted to have complete loss of cartilage and   bone-on-bone arthritis with associated osteophytes in the lateral and patellofemoral compartments of   the knee with a significant synovitis and associated effusion.      PROCEDURE:  Left total knee replacement.      COMPONENTS USED:  DePuy Attune rotating platform posterior stabilized knee   system, a size 5N femur, 3 tibia, size 8 PS AOX insert, and 32 anatomic patellar   button.      SURGEON:  Pietro Cassis. Alvan Dame, M.D.      ASSISTANT:  Nehemiah Massed, PA-C.      ANESTHESIA:  Spinal.      SPECIMENS:  None.      COMPLICATION:  None.      DRAINS:  None.  EBL: <100cc      TOURNIQUET TIME:   Total Tourniquet Time Documented: Thigh (Left) - 30 minutes Total: Thigh (Left) - 30 minutes  .      The patient was stable to the recovery room.      INDICATION FOR PROCEDURE:  [name] is a 56 y.o. female patient of   mine.  The patient had been seen, evaluated, and treated conservatively in the   office with medication, activity modification, and injections.  The patient had   radiographic changes of bone-on-bone arthritis with endplate sclerosis and osteophytes noted.      The patient failed conservative measures including medication, injections, and activity modification, and at this point was ready for more definitive measures.   Based on the radiographic changes and failed conservative measures, the patient   decided to proceed with total knee replacement.  Risks of infection,   DVT, component failure, need for  revision surgery, postop course, and   expectations were all   discussed and reviewed.  Consent was obtained for benefit of pain   relief.      PROCEDURE IN DETAIL:  The patient was brought to the operative theater.   Once adequate anesthesia, preoperative antibiotics, 2 gm of Ancef, 1 gm of Tranexamic Acid, and 10 mg of Decadron administered, the patient was positioned supine with the left thigh tourniquet placed.  The  left lower extremity was prepped and draped in sterile fashion.  A time-   out was performed identifying the patient, planned procedure, and   extremity.      The left lower extremity was placed in the [facility] leg holder.  The leg was   exsanguinated, tourniquet elevated to 250 mmHg.  A midline incision was   made followed  by median parapatellar arthrotomy.  Following initial   exposure, attention was first directed to the patella.  Precut   measurement was noted to be 20 mm.  I resected down to 13-14 mm and used a   32 anatomic patellar button to restore patellar height as well as cover the cut   surface.      The lug holes were drilled and a metal shim was placed to protect the   patella from retractors and saw blades.      At this point, attention was now directed to the femur.  The femoral   canal was opened with a drill, irrigated to try to prevent fat emboli.  An   intramedullary rod was passed at 3 degrees valgus, 9 mm of bone was   resected off the distal femur.  Following this resection, the tibia was   subluxated anteriorly.  Using the extramedullary guide, 2 mm of bone was resected off   the proximal lateral tibia.  We confirmed the gap would be   stable medially and laterally with a size 6 spacer block as well as confirmed   the cut was perpendicular in the coronal plane, checking with an alignment rod.      Once this was done, I sized the femur to be a size 5 in the anterior-   posterior dimension, chose a narrow component based on medial and   lateral  dimension.  The size 5 rotation block was then pinned in   position anterior referenced using the C-clamp to set rotation.  The   anterior, posterior, and  chamfer cuts were made without difficulty nor   notching making certain that I was along the anterior cortex to help   with flexion gap stability.      The final box cut was made off the lateral aspect of distal femur.      At this point, the tibia was sized to be a size 3, the size 3 tray was   then pinned in position through the medial third of the tubercle,   drilled, and keel punched.  Trial reduction was now carried with a 5 femur,  3 tibia, a size 6 then up to the 8 PS insert, and the 32 anatomic patella botton.  The knee was brought to   extension, full extension with good flexion stability with the patella   tracking through the trochlea without application of pressure.  Given   all these findings the femoral lug holes were drilled and then the trial components removed.  Final components were   opened and cement was mixed.  The knee was irrigated with normal saline   solution and pulse lavage.  The synovial lining was   then injected with 30 cc of 0.25% Marcaine without epinephrine and 1 cc of Toradol plus 30 cc of NS for a total of 61 cc.      The knee was irrigated.  Final implants were then cemented onto clean and   dried cut surfaces of bone with the knee brought to extension with a size 8 PS trial insert.      Once the cement had fully cured, the excess cement was removed   throughout the knee.  I confirmed I was satisfied with the range of   motion and stability, and the final size 8 PS AOX insert was chosen.  It was   placed into the knee.      The tourniquet had been let down at  30 minutes.  No significant   hemostasis required.  The   extensor mechanism was then reapproximated using #1 Vicryl and #0 V-lock sutureswith the knee   in flexion.  The   remaining wound was closed with 2-0 Vicryl and running 4-0 Monocryl.    The knee was cleaned, dried, dressed sterilely using Dermabond and   Aquacel dressing.  The patient was then   brought to recovery room in stable condition, tolerating the procedure   well.   Please note that Physician Assistant, Nehemiah Massed, PA-C, was present for the entirety of the case, and was utilized for pre-operative positioning, peri-operative retractor management, general facilitation of the procedure.  He was also utilized for primary wound closure at the end of the case.              Pietro Cassis Alvan Dame, M.D.    10/13/2016 10:02 AM

## 2016-10-13 NOTE — Evaluation (Addendum)
Physical Therapy Evaluation Patient Details Name: DEAJAH ILLE MRN: [id number] DOB: 15-Dec-1960 Today's Date: 10/13/2016   History of Present Illness  LTKA  Clinical Impression  The patient  Has  Numbness and tingling in the right leg, not ready to attempt standing . Assisted to sitting position on bed edge and back into bed. Pt admitted with above diagnosis. Pt currently with functional limitations due to the deficits listed below (see PT Problem List).  Pt will benefit from skilled PT to increase their independence and safety with mobility to allow discharge to the venue listed below.       Follow Up Recommendations Home health PT;Supervision/Assistance - 24 hour    Equipment Recommendations  None recommended by PT    Recommendations for Other Services       Precautions / Restrictions Precautions Precautions: Fall;Knee      Mobility  Bed Mobility Overal bed mobility: Needs Assistance Bed Mobility: Supine to Sit     Supine to sit: Mod assist     General bed mobility comments: support the left leg, use of bed rail. Assist with left leg onto bed.  Transfers                 General transfer comment: did not attempt, leg remains too numb and weak Ambulation/Gait                Stairs            Wheelchair Mobility    Modified Rankin (Stroke Patients Only)       Balance                                             Pertinent Vitals/Pain Pain Assessment: 0-10 Pain Score: 6  Pain Location: above the left knee Pain Descriptors / Indicators: Aching Pain Intervention(s): Limited activity within patient's tolerance;Monitored during session;Premedicated before session;Repositioned    Home Living Family/patient expects to be discharged to:: Private residence Living Arrangements: Children Available Help at Discharge: Family Type of Home: Apartment Home Access: Stairs to enter Entrance Stairs-Rails: None Entrance Stairs-Number  of Steps: 3 small Home Layout: One level Home Equipment: Environmental consultant - 2 wheels      Prior Function Level of Independence: Independent               Hand Dominance        Extremity/Trunk Assessment   Upper Extremity Assessment Upper Extremity Assessment: Overall WFL for tasks assessed    Lower Extremity Assessment Lower Extremity Assessment: LLE deficits/detail RLE Deficits / Details: numbness persisting in foot and  thigh,  performs SLR with lag and needs assistance    Cervical / Trunk Assessment Cervical / Trunk Assessment: Normal  Communication      Cognition Arousal/Alertness: Awake/alert Behavior During Therapy: WFL for tasks assessed/performed Overall Cognitive Status: Within Functional Limits for tasks assessed                      General Comments      Exercises     Assessment/Plan    PT Assessment Patient needs continued PT services  PT Problem List Decreased strength;Decreased range of motion;Decreased activity tolerance;Decreased balance;Decreased mobility;Decreased knowledge of precautions;Decreased safety awareness;Decreased knowledge of use of DME;Pain          PT Treatment Interventions DME instruction;Gait training;Stair training;Functional mobility training;Therapeutic activities;Patient/family education  PT Goals (Current goals can be found in the Care Plan section)  Acute Rehab PT Goals Patient Stated Goal: to walk PT Goal Formulation: With patient/family Time For Goal Achievement: 10/17/16 Potential to Achieve Goals: Good    Frequency 7X/week   Barriers to discharge        Co-evaluation               End of Session   Activity Tolerance: Patient limited by pain Patient left: in bed;with call bell/phone within reach;with family/visitor present Nurse Communication: Mobility status         Time: PT Time Calculation (min) (ACUTE ONLY): 15 min 1705-1720   Charges:   PT Evaluation $PT Eval Low Complexity: 1  Procedure     PT G CodesClaretha Cooper 10/13/2016, 6:22 PM Tresa Endo PT [phone]

## 2016-10-13 NOTE — Anesthesia Preprocedure Evaluation (Addendum)
Anesthesia Evaluation  Patient identified by MRN, date of birth, ID band Patient awake    Reviewed: Allergy & Precautions, NPO status , Patient's Chart, lab work & pertinent test results  Airway Mallampati: II  TM Distance: >3 FB Neck ROM: Full    Dental no notable dental hx.    Pulmonary Current Smoker,    Pulmonary exam normal breath sounds clear to auscultation       Cardiovascular negative cardio ROS Normal cardiovascular exam Rhythm:Regular Rate:Normal     Neuro/Psych negative neurological ROS  negative psych ROS   GI/Hepatic negative GI ROS, Neg liver ROS,   Endo/Other  negative endocrine ROS  Renal/GU negative Renal ROS  negative genitourinary   Musculoskeletal negative musculoskeletal ROS (+)   Abdominal   Peds negative pediatric ROS (+)  Hematology negative hematology ROS (+)   Anesthesia Other Findings   Reproductive/Obstetrics negative OB ROS                            Anesthesia Physical Anesthesia Plan  ASA: III  Anesthesia Plan: Spinal   Post-op Pain Management: GA combined w/ Regional for post-op pain   Induction:   Airway Management Planned: Simple Face Mask  Additional Equipment:   Intra-op Plan:   Post-operative Plan:   Informed Consent: I have reviewed the patients History and Physical, chart, labs and discussed the procedure including the risks, benefits and alternatives for the proposed anesthesia with the patient or authorized representative who has indicated his/her understanding and acceptance.   Dental advisory given  Plan Discussed with: CRNA  Anesthesia Plan Comments: (SAb plus adductor canal block)        Anesthesia Quick Evaluation

## 2016-10-13 NOTE — Anesthesia Procedure Notes (Signed)
Spinal  Patient location during procedure: OR Start time: 10/13/2016 8:44 AM End time: 10/13/2016 8:46 AM Staffing Resident/CRNA: Harle Stanford R Performed: resident/CRNA  Preanesthetic Checklist Completed: patient identified, site marked, surgical consent, pre-op evaluation, timeout performed, IV checked, risks and benefits discussed and monitors and equipment checked Spinal Block Patient position: sitting Prep: Betadine Patient monitoring: heart rate, cardiac monitor, continuous pulse ox and blood pressure Approach: midline Location: L3-4 Injection technique: single-shot Needle Needle type: Spinocan  Needle gauge: 24 G Needle length: 10 cm Needle insertion depth: 7 cm Assessment Sensory level: T6 Additional Notes Timeout performed. SAB kit date checked. SAB without difficulty

## 2016-10-14 DIAGNOSIS — E669 Obesity, unspecified: Secondary | ICD-10-CM | POA: Diagnosis present

## 2016-10-14 LAB — CBC
HEMATOCRIT: 32.3 % — AB (ref 36.0–46.0)
HEMOGLOBIN: 10.7 g/dL — AB (ref 12.0–15.0)
MCH: 33.3 pg (ref 26.0–34.0)
MCHC: 33.1 g/dL (ref 30.0–36.0)
MCV: 100.6 fL — ABNORMAL HIGH (ref 78.0–100.0)
Platelets: 159 10*3/uL (ref 150–400)
RBC: 3.21 MIL/uL — ABNORMAL LOW (ref 3.87–5.11)
RDW: 12.4 % (ref 11.5–15.5)
WBC: 13.6 10*3/uL — AB (ref 4.0–10.5)

## 2016-10-14 LAB — BASIC METABOLIC PANEL
ANION GAP: 5 (ref 5–15)
BUN: 18 mg/dL (ref 6–20)
CALCIUM: 8.9 mg/dL (ref 8.9–10.3)
CO2: 26 mmol/L (ref 22–32)
Chloride: 110 mmol/L (ref 101–111)
Creatinine, Ser: 0.77 mg/dL (ref 0.44–1.00)
GFR calc non Af Amer: >60 mL/min (ref >60)
Glucose, Bld: 117 mg/dL — ABNORMAL HIGH (ref 65–99)
Potassium: 4.7 mmol/L (ref 3.5–5.1)
SODIUM: 141 mmol/L (ref 135–145)

## 2016-10-14 MED ORDER — ASPIRIN 81 MG PO CHEW: 81.0000 mg | CHEWABLE_TABLET | Freq: Two times a day (BID) | ORAL | 0 refills | Status: AC

## 2016-10-14 MED ORDER — POLYETHYLENE GLYCOL 3350 17 G PO PACK: 17.0000 g | PACK | Freq: Two times a day (BID) | ORAL | 0 refills | Status: DC

## 2016-10-14 MED ORDER — CELECOXIB 200 MG PO CAPS: 200.0000 mg | ORAL_CAPSULE | Freq: Two times a day (BID) | ORAL | 0 refills | Status: DC

## 2016-10-14 MED ORDER — FERROUS SULFATE 325 (65 FE) MG PO TABS: 325.0000 mg | ORAL_TABLET | Freq: Three times a day (TID) | ORAL | 3 refills | Status: DC

## 2016-10-14 MED ORDER — DOCUSATE SODIUM 100 MG PO CAPS: 100.0000 mg | ORAL_CAPSULE | Freq: Two times a day (BID) | ORAL | 0 refills | Status: DC

## 2016-10-14 MED ORDER — HYDROCODONE-ACETAMINOPHEN 7.5-325 MG PO TABS: 1.0000 | ORAL_TABLET | ORAL | 0 refills | Status: DC | PRN

## 2016-10-14 MED ORDER — METHOCARBAMOL 500 MG PO TABS: 500.0000 mg | ORAL_TABLET | Freq: Four times a day (QID) | ORAL | 0 refills | Status: DC | PRN

## 2016-10-14 NOTE — Progress Notes (Signed)
Physical Therapy Treatment Patient Details Name: RORI MEINDERS MRN: [id number] DOB: 01-03-1961 Today's Date: 10/14/2016    History of Present Illness s/p L TKA    PT Comments    The patient reports feeling dizzy and feeling of falling over. Required mod assist to stand and pivot to recliner. Not able to ambulate. Most likely will need to stay another night as she is not able to tolerate ambulation. Patient reports that the pain medication is the reason. RN notified that patient is unable to ambualate. Continue PT.   Follow Up Recommendations  Home health PT;Supervision/Assistance - 24 hour     Equipment Recommendations  None recommended by PT    Recommendations for Other Services       Precautions / Restrictions Precautions Precautions: Fall;Knee Restrictions Weight Bearing Restrictions: No    Mobility  Bed Mobility Overal bed mobility: Needs Assistance Bed Mobility: Supine to Sit     Supine to sit: Mod assist     General bed mobility comments: support the Left leg, use of bed rail. Asist with  leg onto the floor.  Transfers Overall transfer level: Needs assistance Equipment used: Rolling walker (2 wheeled) Transfers: Sit to/from [facility] Sit to Stand: Mod assist Stand pivot transfers: Mod assist       General transfer comment: cues for hand placement and left leg. Attempted x 2 to stand. first trial, unable to fully stand.  Only able to pivot to recliner.  Ambulation/Gait                 Stairs            Wheelchair Mobility    Modified Rankin (Stroke Patients Only)       Balance                                    Cognition Arousal/Alertness: Awake/alert Behavior During Therapy: WFL for tasks assessed/performed (emotional about feeling badly)                        Exercises      General Comments        Pertinent Vitals/Pain Pain Score: 6  Pain Location: above the left knee Pain  Descriptors / Indicators: Aching Pain Intervention(s): Limited activity within patient's tolerance;Monitored during session;Premedicated before session;Ice applied    Home Living                      Prior Function            PT Goals (current goals can now be found in the care plan section) Progress towards PT goals: Progressing toward goals    Frequency    7X/week      PT Plan Current plan remains appropriate    Co-evaluation             End of Session   Activity Tolerance: Patient limited by pain;Treatment limited secondary to medical complications (Comment) (reports feeling dizzy, like I'll fall over.) Patient left: in chair;with call bell/phone within reach;with family/visitor present     Time: 1006-1040 PT Time Calculation (min) (ACUTE ONLY): 34 min  Charges:  $Therapeutic Activity: 23-37 mins                    G Codes:      [name] 10/14/2016, 10:50 AM Tresa Endo PT [phone]

## 2016-10-14 NOTE — Progress Notes (Signed)
     Subjective: 1 Day Post-Op Procedure(s) (LRB): LEFT TOTAL KNEE ARTHROPLASTY (Left)   Patient reports pain as mild, pain controlled.  No events throughout the night.  Ready to work with PT and get better, wants to return to work.  Ready to be discharged home.   Objective:   VITALS:   Vitals:   10/14/16 0237 10/14/16 0600  BP: 113/69 105/62  Pulse: 84 68  Resp: 16 16  Temp: 97.9 F (36.6 C) 97.8 F (36.6 C)    Dorsiflexion/Plantar flexion intact Incision: dressing C/D/I No cellulitis present Compartment soft  LABS  Recent Labs  10/14/16 0452  HGB 10.7*  HCT 32.3*  WBC 13.6*  PLT 159     Recent Labs  10/14/16 0452  NA 141  K 4.7  BUN 18  CREATININE 0.77  GLUCOSE 117*     Assessment/Plan: 1 Day Post-Op Procedure(s) (LRB): LEFT TOTAL KNEE ARTHROPLASTY (Left) Foley cath d/c'ed Advance diet Up with therapy D/C IV fluids Discharge home with OPPT Follow up in 2 weeks at [facility]. Follow up with OLIN,Sharia Averitt D in 2 weeks.  Contact information:  [facility] [address] [place] [id number]    Obese (BMI 30-39.9) Estimated body mass index is 30.11 kg/m as calculated from the following:   Height as of this encounter: 5\' 3"  (1.6 m).   Weight as of this encounter: 77.1 kg (170 lb). Patient also counseled that weight may inhibit the healing process Patient counseled that losing weight will help with future health issues      [name]   PAC  10/14/2016, 8:49 AM

## 2016-10-14 NOTE — Care Management Note (Signed)
Case Management Note  Patient Details  Name: [name] MRN: [id number] Date of Birth: 07/03/1961  Subjective/Objective:  56 y.o. F who is s/p L TKA. Has all DME from R TKA in past. [facility] will provide HHPT per her choice. Made [place] aware.                   Action/Plan: Anticipate discharge home Thursday. No further CM needs but will be available should additional discharge needs arise.   Expected Discharge Date:  10/15/16               Expected Discharge Plan:  [place]  In-House Referral:  NA  Discharge planning Services  CM Consult  Post Acute Care Choice:  Home Health Choice offered to:  Patient  DME Arranged:   (Has all DME) DME Agency:  NA  HH Arranged:  PT HH Agency:  [facility]  Status of Service:  Completed, signed off  If discussed at [place] of Stay Meetings, dates discussed:    Additional Comments:  Delrae Sawyers, RN 10/14/2016, 11:58 AM

## 2016-10-14 NOTE — Evaluation (Signed)
Occupational Therapy Evaluation Patient Details Name: [name] MRN: [id number] DOB: [birth year] Today's Date: 10/14/2016    History of Present Illness s/p L TKA h/o R TKA in '11   Clinical Impression   This 56 year old female was admitted for the above sx.  She will benefit from continued OT in acute setting to further educate on bathroom transfers and AE.  Pt completed adl in bathroom, but fatiqued easily and had one LOB.  Goals are for supervision to min guard level in acute    Follow Up Recommendations  Supervision/Assistance - 24 hour    Equipment Recommendations  None recommended by OT    Recommendations for Other Services       Precautions / Restrictions Precautions Precautions: Fall;Knee Restrictions Weight Bearing Restrictions: No      Mobility Bed Mobility         Supine to sit: Min assist     General bed mobility comments: for LLE  Transfers   Equipment used: Rolling walker (2 wheeled)   Sit to Stand: Min guard         General transfer comment: steadying assistance and cues for UE/LE placement    Balance                                            ADL Overall ADL's : Needs assistance/impaired     Grooming: Wash/dry hands;Wash/dry face;Set up;Sitting   Upper Body Bathing: Set up;Sitting   Lower Body Bathing: Minimal assistance;Sit to/from stand   Upper Body Dressing : Set up;Sitting   Lower Body Dressing: Minimal assistance;Sit to/from stand (pants and underwear)   Toilet Transfer: Min guard;Ambulation;BSC;RW             General ADL Comments: performed ADL in bathroom; pt tired and wanted to wait on brushing teeth.  She had one LOB when standing for peri care:  encouraged releasing one hand at a time. Also educated on keeping walker in front of her and hand placement for sit to stand     Vision     Perception     Praxis      Pertinent Vitals/Pain Pain Assessment: Faces Faces Pain Scale: Hurts  little more Pain Location: knee/thigh (L) Pain Descriptors / Indicators: Sore Pain Intervention(s): Limited activity within patient's tolerance;Monitored during session;Premedicated before session;Repositioned;Ice applied     Hand Dominance     Extremity/Trunk Assessment Upper Extremity Assessment Upper Extremity Assessment: Overall WFL for tasks assessed           Communication Communication Communication: No difficulties   Cognition Arousal/Alertness: Awake/alert Behavior During Therapy: WFL for tasks assessed/performed Overall Cognitive Status: Within Functional Limits for tasks assessed                     General Comments       Exercises       Shoulder Instructions      Home Living Family/patient expects to be discharged to:: Private residence Living Arrangements: Children Available Help at Discharge: Family               Bathroom Shower/Tub: Walk-in Psychologist, prison and probation services: Standard     Home Equipment: Shower seat          Prior Functioning/Environment Level of Independence: Independent  OT Problem List: Pain;Decreased activity tolerance;Decreased knowledge of use of DME or AE   OT Treatment/Interventions: Self-care/ADL training;DME and/or AE instruction;Patient/family education;Balance training    OT Goals(Current goals can be found in the care plan section) Acute Rehab OT Goals Patient Stated Goal: to walk OT Goal Formulation: With patient Time For Goal Achievement: 10/21/16 Potential to Achieve Goals: Good ADL Goals Pt Will Transfer to Toilet: with supervision;ambulating;bedside commode Pt Will Perform Tub/Shower Transfer: Shower transfer;with min guard assist;ambulating;3 in 1 Additional ADL Goal #1: pt will verbalize AE for use for adls  OT Frequency: Min 2X/week   Barriers to D/C:            Co-evaluation              End of Session    Activity Tolerance: Patient limited by pain;Patient  limited by fatigue Patient left: in bed;with call bell/phone within reach;with bed alarm set   Time: [id number] OT Time Calculation (min): 30 min Charges:  OT General Charges $OT Visit: 1 Procedure OT Evaluation $OT Eval Low Complexity: 1 Procedure OT Treatments $Self Care/Home Management : 8-22 mins G-Codes:    [name] Oct 21, 2016, 3:47 PM  Lesle Chris, OTR/L [phone] 10/21/16

## 2016-10-14 NOTE — Discharge Instructions (Signed)

## 2016-10-14 NOTE — Consult Note (Signed)
   [facility] CM Inpatient Consult   10/14/2016  SUMITA KOLBECK 1961-05-22 [id number]     Came to bedside to visit [name] on behalf of [employer] program for [place] employees/dependents with [facility] insurance. She states she in pain. Nursing at bedside shortly after. Therefore, bedside encounter was brief. Ms. Grimley denies having and [employer] needs. States she goes to [facility] for Primary Care. Confirms best contact number as [phone] for post discharge call. Provided contact information as well. Appreciative of visit.    Marthenia Rolling, MSN-Ed, RN,BSN [facility] Liaison [phone]

## 2016-10-14 NOTE — Progress Notes (Signed)
OT Cancellation Note  Patient Details Name: [name] MRN: [id number] DOB: 1960-10-25   Cancelled Treatment:    Reason Eval/Treat Not Completed: Other (comment).  Pt not ready for OT right now per PT. Will check back later.  Miyanna Wiersma 10/14/2016, 10:54 AM  Lesle Chris, OTR/L [phone] 10/14/2016

## 2016-10-14 NOTE — Progress Notes (Signed)
Physical Therapy Treatment Patient Details Name: [name] MRN: [id number] DOB: October 28, 1960 Today's Date: 10/14/2016    History of Present Illness s/p L TKA h/o R TKA in '11    PT Comments    The patient reports being fatigued, pain has improved. Continue PT.  Follow Up Recommendations  Home health PT;Supervision/Assistance - 24 hour     Equipment Recommendations  None recommended by PT    Recommendations for Other Services       Precautions / Restrictions Precautions Precautions: Fall;Knee Restrictions Weight Bearing Restrictions: No    Mobility  Bed Mobility   Bed Mobility: Supine to Sit;Sit to Supine     Supine to sit: Min assist Sit to supine: Min assist   General bed mobility comments: for LLE  Transfers Overall transfer level: Needs assistance Equipment used: Rolling walker (2 wheeled) Transfers: Sit to/from [facility] Sit to Stand: Min guard         General transfer comment: steadying assistance and cues for UE/LE placement  Ambulation/Gait Ambulation/Gait assistance: Min guard Ambulation Distance (Feet): 20 Feetx 2 Assistive device: Rolling walker (2 wheeled) Gait Pattern/deviations: Step-to pattern;Antalgic     General Gait Details: cues for safety and sequence and posture, tends to put feet beyond front of RW   Stairs            Wheelchair Mobility    Modified Rankin (Stroke Patients Only)       Balance                                    Cognition Arousal/Alertness: Awake/alert Behavior During Therapy: WFL for tasks assessed/performed Overall Cognitive Status: Within Functional Limits for tasks assessed                      Exercises Total Joint Exercises Ankle Circles/Pumps: AROM;Both;10 reps Quad Sets: AROM;Both;10 reps Heel Slides: AAROM;Left;10 reps Hip ABduction/ADduction: AAROM;Left;10 reps Straight Leg Raises: AAROM;Left;10 reps Goniometric ROM: 10-70 knee  flexion(L)    General Comments        Pertinent Vitals/Pain Pain Assessment: Faces Pain Score: 7  Faces Pain Scale: Hurts little more Pain Location: knee/thigh (L) Pain Descriptors / Indicators: Sore;Aching Pain Intervention(s): Monitored during session;Premedicated before session;Repositioned    Home Living Family/patient expects to be discharged to:: Private residence Living Arrangements: Children Available Help at Discharge: Family         Home Equipment: Shower seat      Prior Function Level of Independence: Independent          PT Goals (current goals can now be found in the care plan section) Acute Rehab PT Goals Patient Stated Goal: to walk Progress towards PT goals: Progressing toward goals    Frequency    7X/week      PT Plan Current plan remains appropriate    Co-evaluation             End of Session   Activity Tolerance: Patient limited by pain Patient left: in bed;with call bell/phone within reach;with bed alarm set     Time: [id number] PT Time Calculation (min) (ACUTE ONLY): 20 min  Charges:  $Gait Training: 8-22 mins                    G Codes:      [name] 10/14/2016, 5:19 PM

## 2016-10-15 LAB — CBC
HCT: 29.6 % — ABNORMAL LOW (ref 36.0–46.0)
HEMOGLOBIN: 9.9 g/dL — AB (ref 12.0–15.0)
MCH: 33.7 pg (ref 26.0–34.0)
MCHC: 33.4 g/dL (ref 30.0–36.0)
MCV: 100.7 fL — AB (ref 78.0–100.0)
Platelets: 141 10*3/uL — ABNORMAL LOW (ref 150–400)
RBC: 2.94 MIL/uL — AB (ref 3.87–5.11)
RDW: 12.8 % (ref 11.5–15.5)
WBC: 12.1 10*3/uL — AB (ref 4.0–10.5)

## 2016-10-15 LAB — BASIC METABOLIC PANEL
ANION GAP: 6 (ref 5–15)
BUN: 15 mg/dL (ref 6–20)
CO2: 27 mmol/L (ref 22–32)
Calcium: 9.1 mg/dL (ref 8.9–10.3)
Chloride: 108 mmol/L (ref 101–111)
Creatinine, Ser: 0.51 mg/dL (ref 0.44–1.00)
GFR calc Af Amer: >60 mL/min (ref >60)
GLUCOSE: 90 mg/dL (ref 65–99)
POTASSIUM: 4.5 mmol/L (ref 3.5–5.1)
Sodium: 141 mmol/L (ref 135–145)

## 2016-10-15 MED FILL — HYDROCODON-APAP 7.5-325: 7.5-325 | 5 days supply | Qty: 60 | Fill #0

## 2016-10-15 MED FILL — CELECOXIB 200 MG CAPSULE: 200 | 30 days supply | Qty: 60 | Fill #0

## 2016-10-15 MED FILL — METHOCARBAMOL 500 MG TABLET: 500 | 10 days supply | Qty: 40 | Fill #0

## 2016-10-15 MED FILL — ASPIR-LOW EC 81 MG TABLET: 81 | 30 days supply | Qty: 60 | Fill #0

## 2016-10-15 NOTE — Care Management Note (Addendum)
Case Management Note  Patient Details  Name: [name] MRN: [id number] Date of Birth: March 15, 1961  Subjective/Objective:                  LEFT TOTAL KNEE ARTHROPLASTY (Left) Action/Plan: Discharge planning Expected Discharge Date:  10/15/16               Expected Discharge Plan:  Home/Self Care  In-House Referral:  NA  Discharge planning Services  CM Consult  Post Acute Care Choice:  NA Choice offered to:  Patient  DME Arranged:   (Has all DME) DME Agency:  NA  HH Arranged:  NA HH Agency:  NA  Status of Service:  Completed, signed off  If discussed at [place] of Stay Meetings, dates discussed:    Additional Comments: RN notified CM of change of plan for pt; CM met with pt to confirm the change of plan is for outpt PT; pt confirms.  NO other CM needs were communicated. CM notified [facility] to withdraw referral.  Dellie Catholic, RN 10/15/2016, 11:57 AM

## 2016-10-15 NOTE — Progress Notes (Signed)
     Subjective: 2 Days Post-Op Procedure(s) (LRB): LEFT TOTAL KNEE ARTHROPLASTY (Left)   Patient reports pain as mild, pain well controlled. No events throughout the night.  Feels much better today as compared to yesterday.  Feels that she is ready to be discharged home.   Objective:   VITALS:   Vitals:   10/14/16 2130 10/15/16 0455  BP: (!) 125/95 (!) 127/95  Pulse: 65 70  Resp: 16 18  Temp: 98.5 F (36.9 C) 98 F (36.7 C)    Dorsiflexion/Plantar flexion intact Incision: dressing C/D/I No cellulitis present Compartment soft  LABS  Recent Labs  10/14/16 0452 10/15/16 0456  HGB 10.7* 9.9*  HCT 32.3* 29.6*  WBC 13.6* 12.1*  PLT 159 141*     Recent Labs  10/14/16 0452 10/15/16 0456  NA 141 141  K 4.7 4.5  BUN 18 15  CREATININE 0.77 0.51  GLUCOSE 117* 90     Assessment/Plan: 2 Days Post-Op Procedure(s) (LRB): LEFT TOTAL KNEE ARTHROPLASTY (Left) Up with therapy Discharge home Follow up in 2 weeks at [facility]. Follow up with OLIN,Kortney Schoenfelder D in 2 weeks.  Contact information:  [facility] [address] [place] [id number]    Obese (BMI 30-39.9) Estimated body mass index is 30.11 kg/m as calculated from the following:   Height as of this encounter: 5\' 3"  (1.6 m).   Weight as of this encounter: 77.1 kg (170 lb). Patient also counseled that weight may inhibit the healing process Patient counseled that losing weight will help with future health issues        [place] Pugh. Peng Thorstenson   PAC  10/15/2016, 10:00 AM

## 2016-10-15 NOTE — Progress Notes (Signed)
Physical Therapy Treatment Patient Details Name: [name] MRN: [id number] DOB: [birth year] Today's Date: 10/15/2016    History of Present Illness s/p L TKA h/o R TKA in '11    PT Comments    Pt made significant improvement with mobility today. She reports she walked with nurse tech to the nurses station (~150') earlier this morning and that she's feeling much better today compared to yesterday. With PT, she ambulated 80', completed stair training, and performed L TKA exercises. She is independent with SLR, AAROM L knee 5-95*. From PT standpoint she is ready to DC home.      Follow Up Recommendations  Home health PT;Supervision/Assistance - 24 hour     Equipment Recommendations  None recommended by PT    Recommendations for Other Services       Precautions / Restrictions Precautions Precautions: Fall;Knee Restrictions Weight Bearing Restrictions: No    Mobility  Bed Mobility   Bed Mobility: Sit to Supine       Sit to supine: Modified independent (Device/Increase time)   General bed mobility comments: with rail, HOB up 30*  Transfers Overall transfer level: Needs assistance Equipment used: Rolling walker (2 wheeled) Transfers: Sit to/from Stand Sit to Stand: Supervision         General transfer comment: VCs for hand placement  Ambulation/Gait Ambulation/Gait assistance: Min guard Ambulation Distance (Feet): 80 Feet Assistive device: Rolling walker (2 wheeled) Gait Pattern/deviations: Step-to pattern   Gait velocity interpretation: Below normal speed for age/gender General Gait Details: steady with RW, good sequencing, VCs to roll rather than lift RW   Stairs Stairs: Yes   Stair Management: One rail Right;Step to pattern;Forwards;With cane Number of Stairs: 5 General stair comments: frequent verbal cues for sequencing, min A to steady  Wheelchair Mobility    Modified Rankin (Stroke Patients Only)       Balance Overall balance assessment:  Modified Independent                                  Cognition Arousal/Alertness: Awake/alert Behavior During Therapy: WFL for tasks assessed/performed Overall Cognitive Status: Within Functional Limits for tasks assessed                      Exercises Total Joint Exercises Ankle Circles/Pumps: AROM;Both;10 reps Quad Sets: AROM;Both;5 reps Short Arc Quad: AROM;Left;10 reps;Supine Heel Slides: AAROM;Left;10 reps Straight Leg Raises: Left;AROM;15 reps;Supine Long Arc Quad: AROM;Left;10 reps;Seated Knee Flexion: AROM;Left;10 reps;Seated Goniometric ROM: 5-95* AAROM L knee    General Comments        Pertinent Vitals/Pain Pain Score: 4  Pain Location: L knee Pain Descriptors / Indicators: Aching;Sore Pain Intervention(s): Limited activity within patient's tolerance;Monitored during session;Premedicated before session;Ice applied    Home Living                      Prior Function            PT Goals (current goals can now be found in the care plan section) Acute Rehab PT Goals Patient Stated Goal: to walk PT Goal Formulation: With patient Time For Goal Achievement: 10/17/16 Potential to Achieve Goals: Good Progress towards PT goals: Progressing toward goals    Frequency    7X/week      PT Plan Current plan remains appropriate    Co-evaluation             End of  Session Equipment Utilized During Treatment: Gait belt Activity Tolerance: Patient tolerated treatment well Patient left: in bed;with call bell/phone within reach;with family/visitor present     Time: 0920-0946 PT Time Calculation (min) (ACUTE ONLY): 26 min  Charges:  $Gait Training: 8-22 mins $Therapeutic Exercise: 8-22 mins                    G Codes:      [name] 10/15/2016, 9:54 AM [phone]

## 2016-10-15 NOTE — Progress Notes (Signed)
Occupational Therapy Treatment Patient Details Name: [name] MRN: [id number] DOB: Feb 11, 1961 Today's Date: 10/15/2016    History of present illness s/p L TKA h/o R TKA in '11   OT comments  All education completed this session  Follow Up Recommendations   no further OT   Equipment Recommendations    none recommended by OT   Recommendations for Other Services      Precautions / Restrictions Precautions Precautions: Fall;Knee Restrictions Weight Bearing Restrictions: No       Mobility Bed Mobility           Sit to supine: Modified independent (Device/Increase time)      Transfers   Equipment used: Rolling walker (2 wheeled)   Sit to Stand: Supervision         General transfer comment: cues for UE placement    Balance                                   ADL                                   Tub/ Shower Transfer: Walk-in shower;Min guard;Ambulation;3 in 1;Rolling walker     General ADL Comments: practiced shower transfer:  simulated toilet (bed).  Pt had recently used commode.  Assisted her with changing socks as she had her own on without non-skid on the bottom.  Pt does not have AE but she will have daughter and sister assist her at home.  Reviewed precautions      Vision                     Perception     Praxis      Cognition   Behavior During Therapy: WFL for tasks assessed/performed Overall Cognitive Status: Within Functional Limits for tasks assessed                       Extremity/Trunk Assessment               Exercises     Shoulder Instructions       General Comments      Pertinent Vitals/ Pain       Pain Assessment: Faces Faces Pain Scale: Hurts little more Pain Location: L knee Pain Descriptors / Indicators: Sore Pain Intervention(s): Limited activity within patient's tolerance;Monitored during session;Premedicated before session;Repositioned  Home Living  Family/patient expects to be discharged to:: Private residence Living Arrangements: Children Available Help at Discharge: Family                                    Prior Functioning/Environment              Frequency           Progress Toward Goals  OT Goals(current goals can now be found in the care plan section)  Progress towards OT goals: Progressing toward goals (no further OT is needed at this time)     Plan      Co-evaluation                 End of Session     Activity Tolerance Patient tolerated treatment well   Patient Left in bed;with call bell/phone within reach;with bed alarm  set   Nurse Communication          Time: [id number] OT Time Calculation (min): 10 min  Charges: OT General Charges $OT Visit: 1 Procedure OT Treatments $Self Care/Home Management : 8-22 mins  Nachelle Negrette 10/15/2016, 2:16 PM   Lesle Chris, OTR/L [phone] 10/15/2016

## 2016-10-16 ENCOUNTER — Ambulatory Visit: Payer: PRIVATE HEALTH INSURANCE | Attending: Orthopedic Surgery | Admitting: Physical Therapy

## 2016-10-16 ENCOUNTER — Other Ambulatory Visit: Payer: Self-pay | Admitting: *Deleted

## 2016-10-16 DIAGNOSIS — M6281 Muscle weakness (generalized): Secondary | ICD-10-CM | POA: Diagnosis present

## 2016-10-16 DIAGNOSIS — R269 Unspecified abnormalities of gait and mobility: Secondary | ICD-10-CM | POA: Insufficient documentation

## 2016-10-16 DIAGNOSIS — M25662 Stiffness of left knee, not elsewhere classified: Secondary | ICD-10-CM | POA: Insufficient documentation

## 2016-10-16 DIAGNOSIS — R600 Localized edema: Secondary | ICD-10-CM | POA: Insufficient documentation

## 2016-10-16 DIAGNOSIS — M25562 Pain in left knee: Secondary | ICD-10-CM | POA: Diagnosis not present

## 2016-10-16 NOTE — Patient Instructions (Signed)
      Copyright  VHI. All rights reserved.  HIP: Flexion / KNEE: Extension, Straight Leg Raise  You may need help at first. Raise leg, keeping knee straight. Perform slowly. _10__ reps per set, _3__ sets per day, __7_ days per week   Copyright  VHI. All rights reserved.  Heel Slide  You may need help at first.   Bend knee and pull heel toward buttocks. Hold __3__ seconds. Return. Repeat with other knee. Repeat __10__ times. Do __3__ sessions per day.  http://gt2.exer.us/372   Copyright  VHI. All rights reserved.     Raise leg until knee is straight. _10__ reps per set, _3__ sets per day, _7__ days per week  Copyright  VHI. All rights reserved.  Short Arc [employer] a large can or rolled towel under leg. Straighten knee and leg. Hold _3-5___ seconds. Repeat with other leg. Repeat __10__ times. Do ___3_ sessions per day.  http://gt2.exer.us/365   Copyright  VHI. All rights reserved.  Quad Set   Slowly tighten muscles on thigh of straight leg while counting out loud to 5____.Marland Kitchen Repeat __10__ times. Do _3___ sessions per day.  http://gt2.exer.us/361   Copyright  VHI. All rights reserved.     [facility] [address], [place], [place] [address] Phone # [phone] Fax [phone]

## 2016-10-16 NOTE — Therapy (Signed)
[facility] [address] [place], [place], [address] Phone: [phone]   Fax:  [phone]  Physical Therapy Evaluation  Patient Details  Name: [name] MRN: [id number] Date of Birth: 04/04/1961 Referring Provider: Dr. Alvan Dame  Encounter Date: 10/16/2016      PT End of Session - 10/16/16 1146    Visit Number 1   Date for PT Re-Evaluation 12/11/16   Authorization Type W/C Sharol Siler   Fax progress notes   PT Start Time 1100   PT Stop Time 1200   PT Time Calculation (min) 60 min   Activity Tolerance Patient tolerated treatment well      Past Medical History:  Diagnosis Date  . Acute meniscal tear of left knee   . Complication of anesthesia 5/10   severe sore throat   . Numbness    left knee  . OA (osteoarthritis) of knee    oa    Past Surgical History:  Procedure Laterality Date  . CESAREAN SECTION     x 1  . CHONDROPLASTY Left 10/19/2014   Procedure:  LEFT MEDIAL PETELLA CHONDROPLASTY;  Surgeon: [name], MD;  [facility];  Service: Orthopedics;  Laterality: Left;  . DILATION AND CURETTAGE OF UTERUS  1996  . HYSTEROSCOPY W/D&C  05-11-2000  . KNEE ARTHROSCOPY Left 10/19/2014   Procedure: LEFT ARTHROSCOPY KNEE WITH DEBRIEDMENT;  Surgeon: [name], MD;  Location: [facility];  Service: Orthopedics;  Laterality: Left;  . KNEE ARTHROSCOPY WITH LATERAL MENISECTOMY Left 10/19/2014   Procedure: KNEE ARTHROSCOPY WITH LATERAL MENISECTOMY;  Surgeon: [name], MD;  Location: [facility];  Service: Orthopedics;  Laterality: Left;  . ROBOTIC ASSISTED TOTAL HYSTERECTOMY  02/26/2009   partial  . TOTAL KNEE ARTHROPLASTY Right 05-26-2010  . TOTAL KNEE ARTHROPLASTY Left 10/13/2016   Procedure: LEFT TOTAL KNEE ARTHROPLASTY;  Surgeon: Paralee Cancel, MD;  Location: [facility];  Service: Orthopedics;  Laterality: Left;  . TUBAL LIGATION      There were no  vitals filed for this visit.       Subjective Assessment - 10/16/16 1110    Subjective Injury at work 10/22/15 fell resulting in fracture (works in housekeeping at [employer]).  Following injury worked 4 hours/day.   Had arthroscopic surgery 3 months ago  Left TKR 10/13/16.  Discharged home yesterday with son and daughter to assist.  Presents with RW    Pertinent History right TKR 7 years ago Dr. Noemi Chapel   Limitations Walking;House hold activities;Standing   How long can you walk comfortably? 80 feet   Diagnostic tests X-ray   Patient Stated Goals go back to work normal 8 hours   Currently in Pain? Yes   Pain Score 8    Pain Location Knee   Pain Orientation Left   Pain Type Surgical pain   Pain Onset In the past 7 days   Pain Frequency Constant   Aggravating Factors  bending knee, moving around   Pain Relieving Factors ice packs             [facility] PT Assessment - 10/16/16 0001      Assessment   Medical Diagnosis left TKR   Referring Provider Dr. Alvan Dame   Onset Date/Surgical Date 10/13/16   Hand Dominance Right   Next MD Visit 11/26/16   Prior Therapy had scope surgery      Precautions   Precautions None  Restrictions   Weight Bearing Restrictions No     Balance Screen   Has the patient fallen in the past 6 months No   Has the patient had a decrease in activity level because of a fear of falling?  No   Is the patient reluctant to leave their home because of a fear of falling?  No     Home Environment   Living Environment Private residence   Living Arrangements Children   Type of [place] to enter   Home Layout One level   [place] - 2 wheels     Prior Function   Level of [place] with household mobility with device   Vocation Full time employment   Leisure walk my dog, swim     Observation/Other Assessments   Focus on Therapeutic Outcomes (FOTO)  82% limitation     Circumferential Edema    Circumferential - Right 38 cm; 48 cm 10cm above pole   Circumferential - Left  46 cm mid patella;10cm above pole 50.5 superior pole     Posture/Postural Control   Posture Comments dressing over left knee     AROM   Right/Left Knee Right   Right Knee Extension 0   Right Knee Flexion 125   Left Knee Extension -30   Left Knee Flexion 90  supine and sitting     Strength   Right Knee Flexion 5/5   Right Knee Extension 5/5   Left Knee Flexion 2+/5   Left Knee Extension 2+/5     Palpation   Patella mobility peripatellar swelling                    [facility] Adult PT Treatment/Exercise - 10/16/16 0001      Knee/Hip Exercises: Supine   Short Arc [employer];Left;1 set;10 reps   Heel Slides AAROM;Left;1 set;10 reps   Straight Leg Raises AAROM;Left;1 set;10 reps   Other Supine Knee/Hip Exercises quad sets 10x     Vasopneumatic   Number Minutes Vasopneumatic  15 minutes   Vasopnuematic Location  Knee   Vasopneumatic Pressure Low   Vasopneumatic Temperature  32 degrees                PT Education - 10/16/16 1146    Education provided Yes   Education Details supine heel slides, SAQ, SLRs, quad sets, LAQs with assist as needed   Person(s) Educated Patient   Methods Explanation;Demonstration;Handout   Comprehension Verbalized understanding;Returned demonstration          PT Short Term Goals - 10/16/16 1219      PT SHORT TERM GOAL #1   Title Pt will be independent with initial HEP   Time 4   Period Weeks   Status New     PT SHORT TERM GOAL #2   Title The patient will be able to ambulate household distances with a cane, walker for short to medium community distances   Time 4   Period Weeks   Status New     PT SHORT TERM GOAL #3   Title Left knee flexion will increase to at least 100  degrees needed for greater ease getting in/out of the car   Time 4   Period Weeks   Status New           PT Long Term Goals - 10/16/16 1221      PT LONG TERM  GOAL #1   Title Pt  will be independent  with advanced HEP  12/11/16    Time 8   Period Weeks   Status New     PT LONG TERM GOAL #2   Title The patient will be able to ambulate medium community distances with a cane.   Time 8   Period Weeks   Status On-going     PT LONG TERM GOAL #3   Title Pt will increase AROM left kneeflexion from 5 - 120 degrees for improved mobility in order to ride in car/perform household chores   Time 8   Period Weeks   Status On-going     PT LONG TERM GOAL #4   Title FOTO will improve from 82% limitation to at least 52% for improved functional mobility at home and work   Time 8   Period Weeks   Status New     PT LONG TERM GOAL #5   Title The patient will have 4/5 quad and hamstring strength needed for greater ease ascending and descending steps with minimal use of railing and partial squatting for work.     Time 8   Period Weeks   Status New     Additional Long Term Goals   Additional Long Term Goals Yes     PT LONG TERM GOAL #6   Title Circumferential measurements of right and left within 3 cm indicating decreased edema needed for faster healing and return to work.     Time 8   Period Weeks   Status New               Plan - 10/16/16 1209    Clinical Impression Statement The patient slipped and fell last January while she was working in housekeeping at [employer] injuring her left knee.   She underwent left total knee replacement on 10/13/16.  She was discharged home yesterday with the assistance of her children.  She presents on post op day 3 with a rolling walker.   She has not attempted community distances yet.  She was able to bathe and dress herself this morning but states her son made her breakfast.   Moderate knee swelling.  Dressing intact.  Needs leg assist to move on/off table secondary to decreased quad motor control  2+/5.  Needs assist with SLR.  Seated knee ROM 30-90 degrees.  Supine 15-95 degrees.  She was instructed in an initial  HEP for ROM and quad muscle activation.  Discussed edema management for home.  The patient is of low complexity evaluation secondary to lack of co-morbidities, stable condition and good home support.     Rehab Potential Good   Clinical Impairments Affecting Rehab Potential Right TKR 7 years ago   PT Frequency 3x / week   PT Duration 8 weeks   PT Treatment/Interventions ADLs/Self Care Home Management;Electrical Stimulation;Cryotherapy;Therapeutic exercise;Neuromuscular re-education;Patient/family education;Manual techniques;Taping   PT Next Visit Plan total knee rehab early phase;  vascompression      Patient will benefit from skilled therapeutic intervention in order to improve the following deficits and impairments:  Abnormal gait, Decreased range of motion, Decreased strength, Increased edema, Difficulty walking, Pain  Visit Diagnosis: Acute pain of left knee - Plan: PT plan of care cert/re-cert  Stiffness of left knee, not elsewhere classified - Plan: PT plan of care cert/re-cert  Muscle weakness (generalized) - Plan: PT plan of care cert/re-cert  Localized edema - Plan: PT plan of care cert/re-cert     Problem List Patient Active Problem List  Diagnosis Date Noted  . Obese 10/14/2016  . S/P left TKA 10/13/2016   Ruben Im, PT 10/16/16 12:31 PM Phone: [phone] Fax: [phone] Alvera Singh 10/16/2016, 12:30 PM  [facility] [address], [place], [place], [address] Phone: [phone]   Fax:  [phone]  Name: [name] MRN: [id number] Date of Birth: Apr 24, 1961

## 2016-10-16 NOTE — Patient Outreach (Addendum)
[place] [facility]) Care Management  10/16/2016  [name] 01-Sep-1961 [id number]  Subjective:  Telephone call to patient's home number, spoke with patient, and HIPAA verified.  Discussed [facility] Care Management UMR Transition of care follow up and is in agreement to complete follow up. Patient states she just got home from the hospital yesterday, is in pain, and currently receiving assistance from daughter to get dressed.   Patient request call back on Monday 10/19/16.  Objective: Per chart review, patient hospitalized  10/13/16 - 10/15/16 for Left knee osteoarthritis.   Status post Left total knee replacement on 10/13/16.     Assessment: Received UMR Transition of care referral on 10/15/16.   Transition of care follow up pending patient contact.   Plan: RNCM will call patient for 2nd telephone outreach attempt, transition of care follow up, within 10 business days, if no return call.     Braylin Xu H. Annia Friendly, BSN, [place] Management [facility] Telephonic CM Phone: [phone] Fax: [phone]

## 2016-10-19 ENCOUNTER — Other Ambulatory Visit: Payer: Self-pay | Admitting: *Deleted

## 2016-10-19 ENCOUNTER — Ambulatory Visit: Payer: PRIVATE HEALTH INSURANCE | Admitting: Physical Therapy

## 2016-10-19 ENCOUNTER — Encounter: Payer: Self-pay | Admitting: Physical Therapy

## 2016-10-19 DIAGNOSIS — R600 Localized edema: Secondary | ICD-10-CM

## 2016-10-19 DIAGNOSIS — M6281 Muscle weakness (generalized): Secondary | ICD-10-CM

## 2016-10-19 DIAGNOSIS — M25562 Pain in left knee: Secondary | ICD-10-CM

## 2016-10-19 DIAGNOSIS — M25662 Stiffness of left knee, not elsewhere classified: Secondary | ICD-10-CM

## 2016-10-19 NOTE — Therapy (Signed)
[facility] [address] [place], [place], [address] Phone: [phone]   Fax:  [phone]  Physical Therapy Treatment  Patient Details  Name: [name] MRN: [id number] Date of Birth: 1960-11-25 Referring Provider: Dr. Alvan Dame  Encounter Date: 10/19/2016      PT End of Session - 10/19/16 1102    Visit Number 2   Date for PT Re-Evaluation 12/11/16   Authorization Type W/C Sharol Siler   PT Start Time 1056   PT Stop Time 1145   PT Time Calculation (min) 49 min   Activity Tolerance Patient tolerated treatment well   Behavior During Therapy [facility] for tasks assessed/performed      Past Medical History:  Diagnosis Date  . Acute meniscal tear of left knee   . Complication of anesthesia 5/10   severe sore throat   . Numbness    left knee  . OA (osteoarthritis) of knee    oa    Past Surgical History:  Procedure Laterality Date  . CESAREAN SECTION     x 1  . CHONDROPLASTY Left 10/19/2014   Procedure:  LEFT MEDIAL PETELLA CHONDROPLASTY;  Surgeon: [name], MD;  Location: [facility];  Service: Orthopedics;  Laterality: Left;  . DILATION AND CURETTAGE OF UTERUS  1996  . HYSTEROSCOPY W/D&C  05-11-2000  . KNEE ARTHROSCOPY Left 10/19/2014   Procedure: LEFT ARTHROSCOPY KNEE WITH DEBRIEDMENT;  Surgeon: [name], MD;  Location: [facility];  Service: Orthopedics;  Laterality: Left;  . KNEE ARTHROSCOPY WITH LATERAL MENISECTOMY Left 10/19/2014   Procedure: KNEE ARTHROSCOPY WITH LATERAL MENISECTOMY;  Surgeon: [name], MD;  Location: [facility];  Service: Orthopedics;  Laterality: Left;  . ROBOTIC ASSISTED TOTAL HYSTERECTOMY  02/26/2009   partial  . TOTAL KNEE ARTHROPLASTY Right 05-26-2010  . TOTAL KNEE ARTHROPLASTY Left 10/13/2016   Procedure: LEFT TOTAL KNEE ARTHROPLASTY;  Surgeon: Paralee Cancel, MD;  Location: [facility];  Service: Orthopedics;  Laterality: Left;  .  TUBAL LIGATION      There were no vitals filed for this visit.      Subjective Assessment - 10/19/16 1101    Subjective My knee is really stiff this AM.    Currently in Pain? Yes   Pain Score 8    Pain Orientation Left   Pain Descriptors / Indicators Aching;Sore;Tightness   Aggravating Factors  Bending, maybe the weather??   Pain Relieving Factors Ice packs   Multiple Pain Sites No            [facility] PT Assessment - 10/19/16 0001      AROM   Left Knee Extension 10   Left Knee Flexion 110                     [facility] Adult PT Treatment/Exercise - 10/19/16 0001      Ambulation/Gait   Assistive device Rolling walker   Ambulation Surface Level   Gait Comments Worked on heel strike, push off and increasing her knee flexion     Knee/Hip Exercises: Aerobic   Nustep L1 x 6 min     Knee/Hip Exercises: Standing   Rebounder weight shifting 3 ways 1 min each     Knee/Hip Exercises: Supine   Quad Sets Strengthening;Left;1 set;10 reps   Short Arc Quad Sets AROM;Strengthening;Left;2 sets;10 reps   Heel Slides AAROM;Left;20 reps  Used towel for assit.   Knee Flexion --  Heels on  red ball: 20x     Vasopneumatic   Number Minutes Vasopneumatic  --  Declined today secondary too cold outside   Vasopnuematic Location  --   Vasopneumatic Pressure --   Vasopneumatic Temperature  --     Manual Therapy   Manual Therapy Soft tissue mobilization   Soft tissue mobilization LT posterior knee/hamstrings                  PT Short Term Goals - 10/19/16 1147      PT SHORT TERM GOAL #1   Title Pt will be independent with initial HEP   Time 4   Period Weeks   Status Achieved     PT SHORT TERM GOAL #2   Title The patient will be able to ambulate household distances with a cane, walker for short to medium community distances   Time 4   Period Weeks   Status On-going  Ambulates with RW for all distances     PT SHORT TERM GOAL #3   Title Left knee flexion will  increase to at least 100  degrees needed for greater ease getting in/out of the car   Time 4   Period Weeks   Status Achieved           PT Long Term Goals - 10/16/16 1221      PT LONG TERM GOAL #1   Title Pt will be independent  with advanced HEP  12/11/16    Time 8   Period Weeks   Status New     PT LONG TERM GOAL #2   Title The patient will be able to ambulate medium community distances with a cane.   Time 8   Period Weeks   Status On-going     PT LONG TERM GOAL #3   Title Pt will increase AROM left kneeflexion from 5 - 120 degrees for improved mobility in order to ride in car/perform household chores   Time 8   Period Weeks   Status On-going     PT LONG TERM GOAL #4   Title FOTO will improve from 82% limitation to at least 52% for improved functional mobility at home and work   Time 8   Period Weeks   Status New     PT LONG TERM GOAL #5   Title The patient will have 4/5 quad and hamstring strength needed for greater ease ascending and descending steps with minimal use of railing and partial squatting for work.     Time 8   Period Weeks   Status New     Additional Long Term Goals   Additional Long Term Goals Yes     PT LONG TERM GOAL #6   Title Circumferential measurements of right and left within 3 cm indicating decreased edema needed for faster healing and return to work.     Time 8   Period Weeks   Status New               Plan - 10/19/16 1103    Clinical Impression Statement Pt compliant with initial HEP. Pt very nervous and at times impulsive. When brought to her attention pt was able to relax and do great. Decreased quad set,was encouraged to do this exercise more frequently. Mild swelling exists, pt declined Gameready due to it being so cold outside. AROM significantly improved since last visit.     Rehab Potential Good   Clinical Impairments Affecting Rehab Potential Right TKR 7 years ago  PT Frequency 3x / week   PT Duration 8 weeks   PT  Treatment/Interventions ADLs/Self Care Home Management;Electrical Stimulation;Cryotherapy;Therapeutic exercise;Neuromuscular re-education;Patient/family education;Manual techniques;Taping   PT Next Visit Plan Sees MD 1/15 for bandage removal. Knee AROM, quad facilitation, weight shifting, Nustep, Game ready.   Consulted and Agree with Plan of Care Patient      Patient will benefit from skilled therapeutic intervention in order to improve the following deficits and impairments:  Abnormal gait, Decreased range of motion, Decreased strength, Increased edema, Difficulty walking, Pain  Visit Diagnosis: Acute pain of left knee  Stiffness of left knee, not elsewhere classified  Muscle weakness (generalized)  Localized edema     Problem List Patient Active Problem List   Diagnosis Date Noted  . Obese 10/14/2016  . S/P left TKA 10/13/2016    Amr Sturtevant, PTA 10/19/2016, 11:48 AM  [place] [address], [place], [place], [address] Phone: [phone]   Fax:  [phone]  Name: BRYNNLEY KHANG MRN: [id number] Date of Birth: 1961/05/12

## 2016-10-19 NOTE — Patient Outreach (Addendum)
[facility]) Care Management  10/19/2016  [name] 1961-08-30 [id number]   Subjective:  Telephone call to patient's home number, no answer, left HIPAA compliant voicemail message, and request call back.   Objective: Per chart review, patient hospitalized  10/13/16 - 10/15/16 for Leftknee osteoarthritis.   Status post Lefttotal knee replacement on 10/13/16.     Assessment: Received UMR Transition of care referral on 10/15/16. Transition of care follow up pending patient contact.   Plan: RNCM will call patient for 3rd telephone outreach attempt, transition of care follow up, within 10 business days, if no return call.     Toshika Parrow H. Annia Friendly, BSN, [place] Management [facility] Telephonic CM Phone: [phone] Fax: [phone]

## 2016-10-20 ENCOUNTER — Encounter: Payer: Self-pay | Admitting: *Deleted

## 2016-10-20 ENCOUNTER — Other Ambulatory Visit: Payer: Self-pay | Admitting: *Deleted

## 2016-10-20 NOTE — Discharge Summary (Signed)
Physician Discharge Summary  Patient ID: [name] MRN: [id number] DOB/AGE: 1961/09/01 56 y.o.  Admit date: 10/13/2016 Discharge date: 10/15/2016   Procedures:  Procedure(s) (LRB): LEFT TOTAL KNEE ARTHROPLASTY (Left)  Attending Physician:  Dr. Paralee Cancel   Admission Diagnoses:   Left knee primary OA / pain  Discharge Diagnoses:  Principal Problem:   S/P left TKA Active Problems:   Obese  Past Medical History:  Diagnosis Date  . Acute meniscal tear of left knee   . Complication of anesthesia 5/10   severe sore throat   . Numbness    left knee  . OA (osteoarthritis) of knee    oa    HPI:    [name], 57 y.o. female, has a history of pain and functional disability in the left knee due to arthritis and has failed non-surgical conservative treatments for greater than 12 weeks to includeNSAID's and/or analgesics, corticosteriod injections, viscosupplementation injections, use of assistive devices and activity modification.  Onset of symptoms was abrupt, starting October 20, 2015 years ago with gradually worsening course since that time. The patient noted no past surgery on the left knee(s).  Patient currently rates pain in the left knee(s) at 8 out of 10 with activity. Patient has night pain, worsening of pain with activity and weight bearing, pain that interferes with activities of daily living, pain with passive range of motion, crepitus and joint swelling.  Patient has evidence of periarticular osteophytes and joint space narrowing by imaging studies. There is no active infection.  Risks, benefits and expectations were discussed with the patient.  Risks including but not limited to the risk of anesthesia, blood clots, nerve damage, blood vessel damage, failure of the prosthesis, infection and up to and including death.  Patient understand the risks, benefits and expectations and wishes to proceed with surgery.   PCP: No PCP Per Patient   Discharged Condition:  good  Hospital Course:  Patient underwent the above stated procedure on 10/13/2016. Patient tolerated the procedure well and brought to the recovery room in good condition and subsequently to the floor.  POD #1 BP: 105/62 ; Pulse: 68 ; Temp: 97.8 F (36.6 C) ; Resp: 16 Patient reports pain as mild, pain controlled.  No events throughout the night.  Ready to work with PT and get better, wants to return to work. Dorsiflexion/plantar flexion intact, incision: dressing C/D/I, no cellulitis present and compartment soft.   LABS  Basename    HGB     10.7  HCT     32.3   POD #2  BP: 127/95 ; Pulse: 70 ; Temp: 98 F (36.7 C) ; Resp: 18 Patient reports pain as mild, pain well controlled. No events throughout the night.  Feels much better today as compared to yesterday.  Feels that she is ready to be discharged home. Dorsiflexion/plantar flexion intact, incision: dressing C/D/I, no cellulitis present and compartment soft.   LABS  Basename    HGB     9.9  HCT     29.6     Discharge Exam: General appearance: alert, cooperative and no distress Extremities: Homans sign is negative, no sign of DVT, no edema, redness or tenderness in the calves or thighs and no ulcers, gangrene or trophic changes  Disposition: Home with follow up in 2 weeks   Follow-up Information    [name], MD. Schedule an appointment as soon as possible for a visit in 2 week(s).   Specialty:  Orthopedic Surgery Contact information:  [address] [place] [address] [id number]           Discharge Instructions    AMB Referral to [place] Management    Complete by:  As directed    Please assign UMR member for post discharge call. Likely discharge today from [place]. Please call with questions. Thanks. Marthenia Rolling, [place], [facility] Liaison-[phone]   Reason for consult:  Please assign UMR member for post discharge call   Expected date of contact:  1-3 days  (reserved for hospital discharges)   Call MD / Call 911    Complete by:  As directed    If you experience chest pain or shortness of breath, CALL 911 and be transported to the hospital emergency room.  If you develope a fever above 101 F, pus (white drainage) or increased drainage or redness at the wound, or calf pain, call your surgeon's office.   Change dressing    Complete by:  As directed    Maintain surgical dressing until follow up in the clinic. If the edges start to pull up, may reinforce with tape. If the dressing is no longer working, may remove and cover with gauze and tape, but must keep the area dry and clean.  Call with any questions or concerns.   Constipation Prevention    Complete by:  As directed    Drink plenty of fluids.  Prune juice may be helpful.  You may use a stool softener, such as Colace (over the counter) 100 mg twice a day.  Use MiraLax (over the counter) for constipation as needed.   Diet - low sodium heart healthy    Complete by:  As directed    Discharge instructions    Complete by:  As directed    Maintain surgical dressing until follow up in the clinic. If the edges start to pull up, may reinforce with tape. If the dressing is no longer working, may remove and cover with gauze and tape, but must keep the area dry and clean.  Follow up in 2 weeks at [facility]. Call with any questions or concerns.   Increase activity slowly as tolerated    Complete by:  As directed    Weight bearing as tolerated with assist device (walker, cane, etc) as directed, use it as long as suggested by your surgeon or therapist, typically at least 4-6 weeks.   TED hose    Complete by:  As directed    Use stockings (TED hose) for 2 weeks on both leg(s).  You may remove them at night for sleeping.      Allergies as of 10/15/2016      Reactions   Tramadol Other (See Comments)   " dont feel right"      Medication List    STOP taking these medications   aspirin EC 325 MG  tablet Replaced by:  aspirin 81 MG chewable tablet   ibuprofen 800 MG tablet Commonly known as:  ADVIL,MOTRIN     TAKE these medications   aspirin 81 MG chewable tablet Chew 1 tablet (81 mg total) by mouth 2 (two) times daily. Take for 4 weeks, then resume regular dose. Replaces:  aspirin EC 325 MG tablet   celecoxib 200 MG capsule Commonly known as:  CELEBREX Take 1 capsule (200 mg total) by mouth every 12 (twelve) hours.   docusate sodium 100 MG capsule Commonly known as:  COLACE Take 1 capsule (100 mg total) by mouth 2 (  two) times daily.   ferrous sulfate 325 (65 FE) MG tablet Take 1 tablet (325 mg total) by mouth 3 (three) times daily after meals.   HYDROcodone-acetaminophen 7.5-325 MG tablet Commonly known as:  NORCO Take 1-2 tablets by mouth every 4 (four) hours as needed for moderate pain.   methocarbamol 500 MG tablet Commonly known as:  ROBAXIN Take 1 tablet (500 mg total) by mouth every 6 (six) hours as needed for muscle spasms.   polyethylene glycol packet Commonly known as:  MIRALAX / GLYCOLAX Take 17 g by mouth 2 (two) times daily.        Signed: [place]. Berklee Battey   PA-C  10/20/2016, 9:39 AM

## 2016-10-20 NOTE — Patient Outreach (Signed)
[place]) Care Management  10/20/2016  [name] September 27, 1961 [id number]   Subjective:Telephone call to patient's home number, spoke with patient, states she is currently in the middle of something with therapist, and request call back.   Objective: Per chart review, patient hospitalized 10/13/16 - 10/15/16 for Leftknee osteoarthritis. Status post Lefttotal knee replacement on 10/13/16.    Assessment: Received UMR Transition of care referral on 10/15/16. Transition of care follow up pending patient contact.   Plan: RNCM will send patient unsuccessful outreach letter, [facility] pamphlet, and proceed with case closure within 10 business days, if no return call.     Lannis Lichtenwalner H. Annia Friendly, BSN, [place] Management [facility] Telephonic CM Phone: [phone] Fax: [phone]

## 2016-10-21 ENCOUNTER — Encounter: Payer: Self-pay | Admitting: Physical Therapy

## 2016-10-21 ENCOUNTER — Ambulatory Visit: Payer: PRIVATE HEALTH INSURANCE | Admitting: Physical Therapy

## 2016-10-21 DIAGNOSIS — M25562 Pain in left knee: Secondary | ICD-10-CM

## 2016-10-21 DIAGNOSIS — M6281 Muscle weakness (generalized): Secondary | ICD-10-CM

## 2016-10-21 DIAGNOSIS — R600 Localized edema: Secondary | ICD-10-CM

## 2016-10-21 DIAGNOSIS — M25662 Stiffness of left knee, not elsewhere classified: Secondary | ICD-10-CM

## 2016-10-21 NOTE — Therapy (Signed)
[facility] [address], [place], [place], [address] Phone: [phone]   Fax:  [phone]  Physical Therapy Treatment  Patient Details  Name: [name] MRN: [id number] Date of Birth: August 29, 1961 Referring Provider: Dr. Alvan Dame  Encounter Date: 10/21/2016      PT End of Session - 10/21/16 1106    Visit Number 3   Date for PT Re-Evaluation 12/11/16   Authorization Type W/C Sharol Siler   PT Start Time 1101   PT Stop Time 1140   PT Time Calculation (min) 39 min   Activity Tolerance Patient tolerated treatment well   Behavior During Therapy [facility] for tasks assessed/performed      Past Medical History:  Diagnosis Date  . Acute meniscal tear of left knee   . Complication of anesthesia 5/10   severe sore throat   . Numbness    left knee  . OA (osteoarthritis) of knee    oa    Past Surgical History:  Procedure Laterality Date  . CESAREAN SECTION     x 1  . CHONDROPLASTY Left 10/19/2014   Procedure:  LEFT MEDIAL PETELLA CHONDROPLASTY;  Surgeon: [name], MD;  Location: [facility];  Service: Orthopedics;  Laterality: Left;  . DILATION AND CURETTAGE OF UTERUS  1996  . HYSTEROSCOPY W/D&C  05-11-2000  . KNEE ARTHROSCOPY Left 10/19/2014   Procedure: LEFT ARTHROSCOPY KNEE WITH DEBRIEDMENT;  Surgeon: [name], MD;  Location: [facility];  Service: Orthopedics;  Laterality: Left;  . KNEE ARTHROSCOPY WITH LATERAL MENISECTOMY Left 10/19/2014   Procedure: KNEE ARTHROSCOPY WITH LATERAL MENISECTOMY;  Surgeon: [name], MD;  Location: [facility];  Service: Orthopedics;  Laterality: Left;  . ROBOTIC ASSISTED TOTAL HYSTERECTOMY  02/26/2009   partial  . TOTAL KNEE ARTHROPLASTY Right 05-26-2010  . TOTAL KNEE ARTHROPLASTY Left 10/13/2016   Procedure: LEFT TOTAL KNEE ARTHROPLASTY;  Surgeon: Paralee Cancel, MD;  [facility];  Service: Orthopedics;  Laterality: Left;  .  TUBAL LIGATION      There were no vitals filed for this visit.      Subjective Assessment - 10/21/16 1108    Subjective I have a lot of pain today.    Currently in Pain? Yes   Pain Score 8    Pain Location Knee   Pain Orientation Left   Pain Descriptors / Indicators Sore   Multiple Pain Sites No                         [facility] Adult PT Treatment/Exercise - 10/21/16 0001      Knee/Hip Exercises: Seated   Long Arc Quad AROM;Strengthening;Left;1 set;10 reps     Knee/Hip Exercises: Supine   Heel Slides --     Acupuncturist Location LT knee   Electrical Stimulation Action IFC   Electrical Stimulation Parameters 15 min to tolerance   Electrical Stimulation Goals Edema;Pain     Vasopneumatic   Number Minutes Vasopneumatic  15 minutes   Vasopnuematic Location  Knee   Vasopneumatic Pressure Medium   Vasopneumatic Temperature  3 flakes                  PT Short Term Goals - 10/19/16 1147      PT SHORT TERM GOAL #1   Title Pt will be independent with initial HEP   Time 4   Period Weeks  Status Achieved     PT SHORT TERM GOAL #2   Title The patient will be able to ambulate household distances with a cane, walker for short to medium community distances   Time 4   Period Weeks   Status On-going  Ambulates with RW for all distances     PT SHORT TERM GOAL #3   Title Left knee flexion will increase to at least 100  degrees needed for greater ease getting in/out of the car   Time 4   Period Weeks   Status Achieved           PT Long Term Goals - 10/16/16 1221      PT LONG TERM GOAL #1   Title Pt will be independent  with advanced HEP  12/11/16    Time 8   Period Weeks   Status New     PT LONG TERM GOAL #2   Title The patient will be able to ambulate medium community distances with a cane.   Time 8   Period Weeks   Status On-going     PT LONG TERM GOAL #3   Title Pt will increase AROM left  kneeflexion from 5 - 120 degrees for improved mobility in order to ride in car/perform household chores   Time 8   Period Weeks   Status On-going     PT LONG TERM GOAL #4   Title FOTO will improve from 82% limitation to at least 52% for improved functional mobility at home and work   Time 8   Period Weeks   Status New     PT LONG TERM GOAL #5   Title The patient will have 4/5 quad and hamstring strength needed for greater ease ascending and descending steps with minimal use of railing and partial squatting for work.     Time 8   Period Weeks   Status New     Additional Long Term Goals   Additional Long Term Goals Yes     PT LONG TERM GOAL #6   Title Circumferential measurements of right and left within 3 cm indicating decreased edema needed for faster healing and return to work.     Time 8   Period Weeks   Status New               Plan - 10/21/16 1107    Clinical Impression Statement After performing heel slide pt completely broke down; she started to cry and stated she was going to leave becasue she hurt too bad. After calming her down PTA convinced pt to stay as we could help her reduce pain.  Upon inspecting the knee, the lateral side appears more swollen than the medial side and does feel warmer . Pt tied the Vasopnumatic device with Estim but after about 5-8 min it did not help and pt requested to get off and go home. She was educated in the signs and symptoms of an infection. When prompted to call MD the pt said she preferred to call her case manager first. Pt left the facility crying and very anxious.    Rehab Potential Good   Clinical Impairments Affecting Rehab Potential Right TKR 7 years ago   PT Frequency 3x / week   PT Duration 8 weeks   PT Treatment/Interventions ADLs/Self Care Home Management;Electrical Stimulation;Cryotherapy;Therapeutic exercise;Neuromuscular re-education;Patient/family education;Manual techniques;Taping   PT Next Visit Plan Follow up with  todays pain: did pt call or see MD?   Consulted and Agree  with Plan of Care --      Patient will benefit from skilled therapeutic intervention in order to improve the following deficits and impairments:  Abnormal gait, Decreased range of motion, Decreased strength, Increased edema, Difficulty walking, Pain  Visit Diagnosis: Acute pain of left knee  Stiffness of left knee, not elsewhere classified  Localized edema  Muscle weakness (generalized)     Problem List Patient Active Problem List   Diagnosis Date Noted  . Obese 10/14/2016  . S/P left TKA 10/13/2016    Karl Erway, PTA 10/21/2016, 11:46 AM  [facility] [address], [place], [place], [address] Phone: [phone]   Fax:  [phone]  Name: [name] MRN: [id number] Date of Birth: 1961/06/06

## 2016-10-23 ENCOUNTER — Ambulatory Visit: Payer: PRIVATE HEALTH INSURANCE | Admitting: Physical Therapy

## 2016-10-23 ENCOUNTER — Encounter: Payer: Self-pay | Admitting: Physical Therapy

## 2016-10-23 DIAGNOSIS — R600 Localized edema: Secondary | ICD-10-CM

## 2016-10-23 DIAGNOSIS — M25662 Stiffness of left knee, not elsewhere classified: Secondary | ICD-10-CM

## 2016-10-23 DIAGNOSIS — M6281 Muscle weakness (generalized): Secondary | ICD-10-CM

## 2016-10-23 DIAGNOSIS — M25562 Pain in left knee: Secondary | ICD-10-CM

## 2016-10-23 NOTE — Therapy (Signed)
[facility] [address] [place], [place], [address] Phone: [phone]   Fax:  [phone]  Physical Therapy Treatment  Patient Details  Name: [name] MRN: [id number] Date of Birth: [birth year] Referring Provider: Dr. Alvan Dame  Encounter Date: 10/23/2016      PT End of Session - 10/23/16 1103    Visit Number 4   Date for PT Re-Evaluation 12/11/16   Authorization Type W/C Sharol Siler   PT Start Time 1100   PT Stop Time 1200   PT Time Calculation (min) 60 min   Activity Tolerance Patient tolerated treatment well   Behavior During Therapy [facility] for tasks assessed/performed      Past Medical History:  Diagnosis Date  . Acute meniscal tear of left knee   . Complication of anesthesia 5/10   severe sore throat   . Numbness    left knee  . OA (osteoarthritis) of knee    oa    Past Surgical History:  Procedure Laterality Date  . CESAREAN SECTION     x 1  . CHONDROPLASTY Left 10/19/2014   Procedure:  LEFT MEDIAL PETELLA CHONDROPLASTY;  Surgeon: [name], MD;  Location: [facility];  Service: Orthopedics;  Laterality: Left;  . DILATION AND CURETTAGE OF UTERUS  1996  . HYSTEROSCOPY W/D&C  05-11-2000  . KNEE ARTHROSCOPY Left 10/19/2014   Procedure: LEFT ARTHROSCOPY KNEE WITH DEBRIEDMENT;  Surgeon: [name], MD;  Location: [facility];  Service: Orthopedics;  Laterality: Left;  . KNEE ARTHROSCOPY WITH LATERAL MENISECTOMY Left 10/19/2014   Procedure: KNEE ARTHROSCOPY WITH LATERAL MENISECTOMY;  Surgeon: [name], MD;  Location: [facility];  Service: Orthopedics;  Laterality: Left;  . ROBOTIC ASSISTED TOTAL HYSTERECTOMY  02/26/2009   partial  . TOTAL KNEE ARTHROPLASTY Right 05-26-2010  . TOTAL KNEE ARTHROPLASTY Left 10/13/2016   Procedure: LEFT TOTAL KNEE ARTHROPLASTY;  Surgeon: Paralee Cancel, MD;  Location: [facility];  Service: Orthopedics;  Laterality: Left;  .  TUBAL LIGATION      There were no vitals filed for this visit.      Subjective Assessment - 10/23/16 1103    Subjective I do feel better today. I have been keeping ice on my knee alot.    Currently in Pain? Yes   Pain Score 7    Pain Location Knee   Pain Orientation Left   Pain Descriptors / Indicators Sore   Multiple Pain Sites No                         [facility] Adult PT Treatment/Exercise - 10/23/16 0001      Ambulation/Gait   Ambulation Distance (Feet) --  30 feet x 6   Assistive device Rolling walker   Gait Pattern Step-to pattern;Decreased hip/knee flexion - left;Decreased dorsiflexion - left;Decreased weight shift to left;Left flexed knee in stance   Ambulation Surface Level   Gait Comments VC for heel strike and quad contraction     Knee/Hip Exercises: Aerobic   Nustep L1 x 6 min     Knee/Hip Exercises: Supine   Quad Sets AROM;Strengthening;Left;1 set;20 reps   Quad Sets Limitations Pt very fearful to do exercise today   Heel Slides AROM;Left;2 sets;10 reps  VC to slow down, not be so impulsive   Knee Flexion --  On red ball 10x     Electrical Stimulation   Electrical Stimulation Location LT knee  Chartered certified accountant IFC   Electrical Stimulation Parameters 15 min   Electrical Stimulation Goals Edema;Pain     Vasopneumatic   Number Minutes Vasopneumatic  15 minutes   Vasopnuematic Location  Knee   Vasopneumatic Pressure Medium   Vasopneumatic Temperature  3 flakes     Manual Therapy   Soft tissue mobilization LT posterior knee/hamstrings                  PT Short Term Goals - 10/19/16 1147      PT SHORT TERM GOAL #1   Title Pt will be independent with initial HEP   Time 4   Period Weeks   Status Achieved     PT SHORT TERM GOAL #2   Title The patient will be able to ambulate household distances with a cane, walker for short to medium community distances   Time 4   Period Weeks   Status On-going  Ambulates  with RW for all distances     PT SHORT TERM GOAL #3   Title Left knee flexion will increase to at least 100  degrees needed for greater ease getting in/out of the car   Time 4   Period Weeks   Status Achieved           PT Long Term Goals - 10/16/16 1221      PT LONG TERM GOAL #1   Title Pt will be independent  with advanced HEP  12/11/16    Time 8   Period Weeks   Status New     PT LONG TERM GOAL #2   Title The patient will be able to ambulate medium community distances with a cane.   Time 8   Period Weeks   Status On-going     PT LONG TERM GOAL #3   Title Pt will increase AROM left kneeflexion from 5 - 120 degrees for improved mobility in order to ride in car/perform household chores   Time 8   Period Weeks   Status On-going     PT LONG TERM GOAL #4   Title FOTO will improve from 82% limitation to at least 52% for improved functional mobility at home and work   Time 8   Period Weeks   Status New     PT LONG TERM GOAL #5   Title The patient will have 4/5 quad and hamstring strength needed for greater ease ascending and descending steps with minimal use of railing and partial squatting for work.     Time 8   Period Weeks   Status New     Additional Long Term Goals   Additional Long Term Goals Yes     PT LONG TERM GOAL #6   Title Circumferential measurements of right and left within 3 cm indicating decreased edema needed for faster healing and return to work.     Time 8   Period Weeks   Status New               Plan - 10/23/16 1103    Clinical Impression Statement Swelling and pain decreased since last session. Both sides of the knee with equal warmth today. Pt consistently ambulates into the clinic walking on her Lt toes. Pt was educated (and practiced ) ambulating with proper heel strike and not being fearful to put weight into her leg. She showed improvement as we practiced. Pt does have fear avoidance regarding general movement of the knee, we discussed  this at length  and she understands that she cannot hurt the knee via  normal movements.     Rehab Potential Good   Clinical Impairments Affecting Rehab Potential Right TKR 7 years ago   PT Frequency 3x / week   PT Duration 8 weeks   PT Treatment/Interventions ADLs/Self Care Home Management;Electrical Stimulation;Cryotherapy;Therapeutic exercise;Neuromuscular re-education;Patient/family education;Manual techniques;Taping   PT Next Visit Plan To MD Monday to get bandage off. Continue with AROM, extension stretching , gait, and quad facilitation.    Consulted and Agree with Plan of Care Patient      Patient will benefit from skilled therapeutic intervention in order to improve the following deficits and impairments:  Abnormal gait, Decreased range of motion, Decreased strength, Increased edema, Difficulty walking, Pain  Visit Diagnosis: Acute pain of left knee  Stiffness of left knee, not elsewhere classified  Localized edema  Muscle weakness (generalized)     Problem List Patient Active Problem List   Diagnosis Date Noted  . Obese 10/14/2016  . S/P left TKA 10/13/2016    Yolani Vo, PTA 10/23/2016, 11:49 AM  [facility] [address], [place], [place], [address] Phone: [phone]   Fax:  [phone]  Name: [name] MRN: [id number] Date of Birth: [birth year]

## 2016-10-26 ENCOUNTER — Ambulatory Visit: Payer: PRIVATE HEALTH INSURANCE

## 2016-10-26 DIAGNOSIS — M25562 Pain in left knee: Secondary | ICD-10-CM | POA: Diagnosis not present

## 2016-10-26 DIAGNOSIS — M6281 Muscle weakness (generalized): Secondary | ICD-10-CM

## 2016-10-26 DIAGNOSIS — M25662 Stiffness of left knee, not elsewhere classified: Secondary | ICD-10-CM

## 2016-10-26 DIAGNOSIS — R600 Localized edema: Secondary | ICD-10-CM

## 2016-10-26 DIAGNOSIS — R269 Unspecified abnormalities of gait and mobility: Secondary | ICD-10-CM

## 2016-10-26 MED FILL — HYDROCODON-APAP 7.5-325: 7.5-325 | 10 days supply | Qty: 40 | Fill #0

## 2016-10-26 NOTE — Therapy (Addendum)
[facility] [address], [place], [place], [address] Phone: [phone]   Fax:  [phone]  Physical Therapy Treatment  Patient Details  Name: [name] MRN: [id number] Date of Birth: 1961/08/03 Referring Provider: Dr. Alvan Dame  Encounter Date: 10/26/2016      PT End of Session - 10/26/16 1300    Visit Number 5   Date for PT Re-Evaluation 12/11/16   Authorization Type W/C Sharol Siler   Authorization Time Period 18 approved visits   Authorization - Visit Number 5   Authorization - Number of Visits 18   PT Start Time 1228   PT Stop Time [id number]  Pt requested to stop exercise early:"It doesn't feel right"   PT Time Calculation (min) 47 min   Activity Tolerance Patient limited by pain   Behavior During Therapy [facility] for tasks assessed/performed;Anxious      Past Medical History:  Diagnosis Date  . Acute meniscal tear of left knee   . Complication of anesthesia 5/10   severe sore throat   . Numbness    left knee  . OA (osteoarthritis) of knee    oa    Past Surgical History:  Procedure Laterality Date  . CESAREAN SECTION     x 1  . CHONDROPLASTY Left 10/19/2014   Procedure:  LEFT MEDIAL PETELLA CHONDROPLASTY;  Surgeon: [name], MD;  Location: [facility];  Service: Orthopedics;  Laterality: Left;  . DILATION AND CURETTAGE OF UTERUS  1996  . HYSTEROSCOPY W/D&C  05-11-2000  . KNEE ARTHROSCOPY Left 10/19/2014   Procedure: LEFT ARTHROSCOPY KNEE WITH DEBRIEDMENT;  Surgeon: [name], MD;  Location: [facility];  Service: Orthopedics;  Laterality: Left;  . KNEE ARTHROSCOPY WITH LATERAL MENISECTOMY Left 10/19/2014   Procedure: KNEE ARTHROSCOPY WITH LATERAL MENISECTOMY;  Surgeon: [name], MD;  Location: [facility];  Service: Orthopedics;  Laterality: Left;  . ROBOTIC ASSISTED TOTAL HYSTERECTOMY  02/26/2009   partial  . TOTAL KNEE ARTHROPLASTY Right  05-26-2010  . TOTAL KNEE ARTHROPLASTY Left 10/13/2016   Procedure: LEFT TOTAL KNEE ARTHROPLASTY;  Surgeon: Paralee Cancel, MD;  Location: [facility];  Service: Orthopedics;  Laterality: Left;  . TUBAL LIGATION      There were no vitals filed for this visit.      Subjective Assessment - 10/26/16 1235    Subjective Saw the MD this morning.  Bandage was removed.     Patient Stated Goals go back to work normal 8 hours   Currently in Pain? Yes   Pain Score 7    Pain Location Knee   Pain Orientation Left   Pain Descriptors / Indicators Sore   Pain Type Surgical pain   Pain Onset 1 to 4 weeks ago   Pain Frequency Constant   Aggravating Factors  walking, standing, bending it   Pain Relieving Factors ice, non weight bearing                         [facility] Adult PT Treatment/Exercise - 10/26/16 0001      Knee/Hip Exercises: Aerobic   Nustep L1 x 7 min     Knee/Hip Exercises: Seated   Long Arc Quad AROM;Strengthening;Left;10 reps;2 sets   Hamstring Curl Strengthening;Left;2 sets;10 reps  red theraband     Knee/Hip Exercises: Supine   Quad Sets AROM;Strengthening;Left;1 set;5 sets   Quad Sets Limitations pt asked to stop as it was hurting  Heel Slides AROM;Left;2 sets;10 reps   Straight Leg Raises Strengthening;Left;2 sets;5 reps     Acupuncturist Location Lt knee   Electrical Stimulation Action IFC   Electrical Stimulation Parameters 15 minutes   Electrical Stimulation Goals Pain;Edema     Manual Therapy   Soft tissue mobilization --                  PT Short Term Goals - 10/26/16 1236      PT SHORT TERM GOAL #1   Title Pt will be independent with initial HEP   Status Achieved     PT SHORT TERM GOAL #2   Title The patient will be able to ambulate household distances with a cane, walker for short to medium community distances   Time 4   Period Weeks   Status On-going     PT SHORT TERM GOAL #3   Title Left knee  flexion will increase to at least 100  degrees needed for greater ease getting in/out of the car   Status Achieved           PT Long Term Goals - 10/16/16 1221      PT LONG TERM GOAL #1   Title Pt will be independent  with advanced HEP  12/11/16    Time 8   Period Weeks   Status New     PT LONG TERM GOAL #2   Title The patient will be able to ambulate medium community distances with a cane.   Time 8   Period Weeks   Status On-going     PT LONG TERM GOAL #3   Title Pt will increase AROM left kneeflexion from 5 - 120 degrees for improved mobility in order to ride in car/perform household chores   Time 8   Period Weeks   Status On-going     PT LONG TERM GOAL #4   Title FOTO will improve from 82% limitation to at least 52% for improved functional mobility at home and work   Time 8   Period Weeks   Status New     PT LONG TERM GOAL #5   Title The patient will have 4/5 quad and hamstring strength needed for greater ease ascending and descending steps with minimal use of railing and partial squatting for work.     Time 8   Period Weeks   Status New     Additional Long Term Goals   Additional Long Term Goals Yes     PT LONG TERM GOAL #6   Title Circumferential measurements of right and left within 3 cm indicating decreased edema needed for faster healing and return to work.     Time 8   Period Weeks   Status New               Plan - 10/26/16 1239    Clinical Impression Statement Pt saw MD today and had bandage removed.  Pt continues to use her walker due to feeling dizzy when walking for safety.  Pt is able to improve her gait pattern with verbal cues.  Pt with improved long arc quad with practice today.  Pt participated in low level exercise with emphasis on A/ROM and strength today.  Pt will continue to benefit from skilled PT for strength, A/ROM, gait and edema management.     Rehab Potential Good   Clinical Impairments Affecting Rehab Potential Right TKR 7 years  ago   PT Frequency  3x / week   PT Duration 8 weeks   PT Treatment/Interventions ADLs/Self Care Home Management;Electrical Stimulation;Cryotherapy;Therapeutic exercise;Neuromuscular re-education;Patient/family education;Manual techniques;Taping   PT Next Visit Plan Lt knee strength, A/ROM, edema management and gait training.  Pt would like to hold on game ready and ice as she feels that it aggravates her knee.     Consulted and Agree with Plan of Care Patient      Patient will benefit from skilled therapeutic intervention in order to improve the following deficits and impairments:  Abnormal gait, Decreased range of motion, Decreased strength, Increased edema, Difficulty walking, Pain  Visit Diagnosis: Acute pain of left knee  Stiffness of left knee, not elsewhere classified  Localized edema  Muscle weakness (generalized)  Abnormal gait     Problem List Patient Active Problem List   Diagnosis Date Noted  . Obese 10/14/2016  . S/P left TKA 10/13/2016    Sigurd Sos, PT 10/26/16 4:24 PM  [facility] [address] [place], [place], [address] Phone: [phone]   Fax:  [phone]  Name: [name] MRN: [id number] Date of Birth: 30-Jun-1961

## 2016-10-28 ENCOUNTER — Encounter: Payer: Self-pay | Admitting: Physical Therapy

## 2016-10-30 ENCOUNTER — Encounter: Payer: Self-pay | Admitting: Physical Therapy

## 2016-11-02 ENCOUNTER — Ambulatory Visit: Payer: PRIVATE HEALTH INSURANCE | Admitting: Physical Therapy

## 2016-11-02 ENCOUNTER — Encounter: Payer: Self-pay | Admitting: Physical Therapy

## 2016-11-02 DIAGNOSIS — R600 Localized edema: Secondary | ICD-10-CM

## 2016-11-02 DIAGNOSIS — M25562 Pain in left knee: Secondary | ICD-10-CM

## 2016-11-02 DIAGNOSIS — M25662 Stiffness of left knee, not elsewhere classified: Secondary | ICD-10-CM

## 2016-11-02 DIAGNOSIS — M6281 Muscle weakness (generalized): Secondary | ICD-10-CM

## 2016-11-02 NOTE — Therapy (Signed)
[facility] [address], [place], [place], [address] Phone: [phone]   Fax:  [phone]  Physical Therapy Treatment  Patient Details  Name: [name] MRN: [id number] Date of Birth: [birth year] Referring Provider: Dr. Alvan Dame  Encounter Date: 11/02/2016      PT End of Session - 11/02/16 1158    Visit Number 6   Date for PT Re-Evaluation 12/11/16   Authorization Type W/C Sharol Siler   Authorization Time Period 63 approved visits   Authorization - Visit Number 6   Authorization - Number of Visits 18   PT Start Time 1155   PT Stop Time 1240   PT Time Calculation (min) 45 min   Activity Tolerance Patient tolerated treatment well   Behavior During Therapy --      Past Medical History:  Diagnosis Date  . Acute meniscal tear of left knee   . Complication of anesthesia 5/10   severe sore throat   . Numbness    left knee  . OA (osteoarthritis) of knee    oa    Past Surgical History:  Procedure Laterality Date  . CESAREAN SECTION     x 1  . CHONDROPLASTY Left 10/19/2014   Procedure:  LEFT MEDIAL PETELLA CHONDROPLASTY;  Surgeon: [name], MD;  Location: [facility];  Service: Orthopedics;  Laterality: Left;  . DILATION AND CURETTAGE OF UTERUS  1996  . HYSTEROSCOPY W/D&C  05-11-2000  . KNEE ARTHROSCOPY Left 10/19/2014   Procedure: LEFT ARTHROSCOPY KNEE WITH DEBRIEDMENT;  Surgeon: [name], MD;  Location: [facility];  Service: Orthopedics;  Laterality: Left;  . KNEE ARTHROSCOPY WITH LATERAL MENISECTOMY Left 10/19/2014   Procedure: KNEE ARTHROSCOPY WITH LATERAL MENISECTOMY;  Surgeon: [name], MD;  Location: [facility];  Service: Orthopedics;  Laterality: Left;  . ROBOTIC ASSISTED TOTAL HYSTERECTOMY  02/26/2009   partial  . TOTAL KNEE ARTHROPLASTY Right 05-26-2010  . TOTAL KNEE ARTHROPLASTY Left 10/13/2016   Procedure: LEFT TOTAL KNEE ARTHROPLASTY;   Surgeon: Paralee Cancel, MD;  Location: [facility];  Service: Orthopedics;  Laterality: Left;  . TUBAL LIGATION      There were no vitals filed for this visit.      Subjective Assessment - 11/02/16 1157    Subjective Pt ambulates into clinic without device today. Very minimal gait abnormalities.    Currently in Pain? --  My knee feels hard and itchy oday, no pain   Multiple Pain Sites No            [facility] PT Assessment - 11/02/16 0001      AROM   Left Knee Extension 8   Left Knee Flexion 110                     [facility] Adult PT Treatment/Exercise - 11/02/16 0001      Knee/Hip Exercises: Stretches   Gastroc Stretch Left;4 reps;10 seconds     Knee/Hip Exercises: Aerobic   Stationary Bike L1 x 5 min   Nustep L1 x 12 min  Review of status/goals/MD appt     Knee/Hip Exercises: Standing   Forward Step Up Left;1 set;10 reps;Step Height: 6"   Rebounder 3 way weight shifting 1 min each     Knee/Hip Exercises: Seated   Long Arc Quad Strengthening;Left;2 sets;10 reps;Weights   Long Arc Quad Weight 1 lbs.   Ball Squeeze --  15x     Knee/Hip Exercises:  Supine   Short Arc Quad Sets Strengthening;Both;2 sets;10 reps   Short Arc Quad Sets Limitations 1# added     Cryotherapy   Number Minutes Cryotherapy 10 Minutes   Cryotherapy Location Knee   Type of Cryotherapy Ice pack                  PT Short Term Goals - 11/02/16 1234      PT SHORT TERM GOAL #2   Title The patient will be able to ambulate household distances with a cane, walker for short to medium community distances   Time 4   Period Weeks   Status Achieved           PT Long Term Goals - 10/16/16 1221      PT LONG TERM GOAL #1   Title Pt will be independent  with advanced HEP  12/11/16    Time 8   Period Weeks   Status New     PT LONG TERM GOAL #2   Title The patient will be able to ambulate medium community distances with a cane.   Time 8   Period Weeks   Status On-going     PT  LONG TERM GOAL #3   Title Pt will increase AROM left kneeflexion from 5 - 120 degrees for improved mobility in order to ride in car/perform household chores   Time 8   Period Weeks   Status On-going     PT LONG TERM GOAL #4   Title FOTO will improve from 82% limitation to at least 52% for improved functional mobility at home and work   Time 8   Period Weeks   Status New     PT LONG TERM GOAL #5   Title The patient will have 4/5 quad and hamstring strength needed for greater ease ascending and descending steps with minimal use of railing and partial squatting for work.     Time 8   Period Weeks   Status New     Additional Long Term Goals   Additional Long Term Goals Yes     PT LONG TERM GOAL #6   Title Circumferential measurements of right and left within 3 cm indicating decreased edema needed for faster healing and return to work.     Time 8   Period Weeks   Status New               Plan - 11/02/16 1159    Clinical Impression Statement Pt ambulates into clinic today without device. Gait pretty normal minus a minimal decrease in knee flexion. Pt did excellent with her exercises today, adding light weights to all her PREs mainly focusing on quad strength. AROM is unchanged in measurements from last, but pt's pain level is significantly better ( less pain) when moving.     Rehab Potential Good   Clinical Impairments Affecting Rehab Potential Right TKR 7 years ago   PT Frequency 3x / week   PT Duration 8 weeks   PT Treatment/Interventions ADLs/Self Care Home Management;Electrical Stimulation;Cryotherapy;Therapeutic exercise;Neuromuscular re-education;Patient/family education;Manual techniques;Taping   PT Next Visit Plan Quad strength, step ups, stretch for hamstring and gastroc.    Consulted and Agree with Plan of Care --      Patient will benefit from skilled therapeutic intervention in order to improve the following deficits and impairments:  Abnormal gait, Decreased  range of motion, Decreased strength, Increased edema, Difficulty walking, Pain  Visit Diagnosis: Acute pain of left knee  Stiffness  of left knee, not elsewhere classified  Localized edema  Muscle weakness (generalized)     Problem List Patient Active Problem List   Diagnosis Date Noted  . Obese 10/14/2016  . S/P left TKA 10/13/2016    Random Dobrowski, PTA 11/02/2016, 12:37 PM  [facility] [address], [place], [place], [address] Phone: [phone]   Fax:  [phone]  Name: [name] MRN: [id number] Date of Birth: Dec 29, 1960

## 2016-11-03 ENCOUNTER — Other Ambulatory Visit: Payer: Self-pay | Admitting: *Deleted

## 2016-11-03 NOTE — Patient Outreach (Signed)
[facility]) Care Management  11/03/2016  RAYSSA STRAMEL 24-Oct-1960 [id number]   No response from patient outreach attempts, will proceed with case closure.  Objective: Per chart review, patient hospitalized 10/13/16 - 10/15/16 for Leftknee osteoarthritis. Status post Lefttotal knee replacement on 10/13/16.    Assessment: Received [facility] Transition of care referral on 10/15/16. Transition of care follow up pending patient contact.   Plan: RNCM will send case closure request due to unable to reach request to [facility].      Talaysia Pinheiro H. Annia Friendly, BSN, [place] Telephonic CM Phone: [phone] Fax: [phone]

## 2016-11-04 ENCOUNTER — Encounter: Payer: Self-pay | Admitting: Physical Therapy

## 2016-11-04 ENCOUNTER — Ambulatory Visit: Payer: PRIVATE HEALTH INSURANCE | Admitting: Physical Therapy

## 2016-11-04 DIAGNOSIS — M25562 Pain in left knee: Secondary | ICD-10-CM | POA: Diagnosis not present

## 2016-11-04 DIAGNOSIS — M6281 Muscle weakness (generalized): Secondary | ICD-10-CM

## 2016-11-04 DIAGNOSIS — M25662 Stiffness of left knee, not elsewhere classified: Secondary | ICD-10-CM

## 2016-11-04 DIAGNOSIS — R600 Localized edema: Secondary | ICD-10-CM

## 2016-11-04 NOTE — Therapy (Signed)
[facility] [address] [place], [place], [address] Phone: [phone]   Fax:  [phone]  Physical Therapy Treatment  Patient Details  Name: [name] MRN: [id number] Date of Birth: 08-15-1961 Referring Provider: Dr. Alvan Dame  Encounter Date: 11/04/2016      PT End of Session - 11/04/16 1226    Visit Number 7   Date for PT Re-Evaluation 12/11/16   Authorization Type W/C Sharol Siler   Authorization Time Period 18 approved visits   Authorization - Visit Number 7   Authorization - Number of Visits 18   PT Start Time 1225   PT Stop Time 1320   PT Time Calculation (min) 55 min   Activity Tolerance Patient tolerated treatment well   Behavior During Therapy [facility] for tasks assessed/performed;Anxious      Past Medical History:  Diagnosis Date  . Acute meniscal tear of left knee   . Complication of anesthesia 5/10   severe sore throat   . Numbness    left knee  . OA (osteoarthritis) of knee    oa    Past Surgical History:  Procedure Laterality Date  . CESAREAN SECTION     x 1  . CHONDROPLASTY Left 10/19/2014   Procedure:  LEFT MEDIAL PETELLA CHONDROPLASTY;  Surgeon: [name], MD;  Location: [facility];  Service: Orthopedics;  Laterality: Left;  . DILATION AND CURETTAGE OF UTERUS  1996  . HYSTEROSCOPY W/D&C  05-11-2000  . KNEE ARTHROSCOPY Left 10/19/2014   Procedure: LEFT ARTHROSCOPY KNEE WITH DEBRIEDMENT;  Surgeon: [name], MD;  Location: [facility];  Service: Orthopedics;  Laterality: Left;  . KNEE ARTHROSCOPY WITH LATERAL MENISECTOMY Left 10/19/2014   Procedure: KNEE ARTHROSCOPY WITH LATERAL MENISECTOMY;  Surgeon: [name], MD;  Location: [facility];  Service: Orthopedics;  Laterality: Left;  . ROBOTIC ASSISTED TOTAL HYSTERECTOMY  02/26/2009   partial  . TOTAL KNEE ARTHROPLASTY Right 05-26-2010  . TOTAL KNEE ARTHROPLASTY Left 10/13/2016   Procedure: LEFT TOTAL KNEE ARTHROPLASTY;  Surgeon: Paralee Cancel, MD;  Location: [facility];  Service: Orthopedics;  Laterality: Left;  . TUBAL LIGATION      There were no vitals filed for this visit.      Subjective Assessment - 11/04/16 1227    Subjective Pt reports doing well. No new complaints.   Currently in Pain? Yes  No pain, feels stiff and "hard"   Multiple Pain Sites No                         [facility] Adult PT Treatment/Exercise - 11/04/16 0001      Knee/Hip Exercises: Stretches   Gastroc Stretch Left;4 reps;10 seconds     Knee/Hip Exercises: Aerobic   Stationary Bike L1 x 10 min   Nustep L1 x 10 min     Knee/Hip Exercises: Machines for Strengthening   Total Gym Leg Press Seat #7 Bil 50# 10x2  Pt could probably do seat #6 but she was too nervous to try     Knee/Hip Exercises: Standing   Forward Step Up Left;2 sets;10 reps;Hand Hold: 0;Step Height: 6"   Rebounder 3 way weight shifting 1 min each     Knee/Hip Exercises: Seated   Long Arc Quad Strengthening;Left;2 sets;10 reps   Long Arc Quad Weight 1 lbs.   Sit to Sand 10 reps;without UE support  VC to bear more weight into LT  Cryotherapy   Number Minutes Cryotherapy 10 Minutes   Cryotherapy Location Knee   Type of Cryotherapy Ice pack                  PT Short Term Goals - 11/02/16 1234      PT SHORT TERM GOAL #2   Title The patient will be able to ambulate household distances with a cane, walker for short to medium community distances   Time 4   Period Weeks   Status Achieved           PT Long Term Goals - 10/16/16 1221      PT LONG TERM GOAL #1   Title Pt will be independent  with advanced HEP  12/11/16    Time 8   Period Weeks   Status New     PT LONG TERM GOAL #2   Title The patient will be able to ambulate medium community distances with a cane.   Time 8   Period Weeks   Status On-going     PT LONG TERM GOAL #3   Title Pt will increase AROM left kneeflexion  from 5 - 120 degrees for improved mobility in order to ride in car/perform household chores   Time 8   Period Weeks   Status On-going     PT LONG TERM GOAL #4   Title FOTO will improve from 82% limitation to at least 52% for improved functional mobility at home and work   Time 8   Period Weeks   Status New     PT LONG TERM GOAL #5   Title The patient will have 4/5 quad and hamstring strength needed for greater ease ascending and descending steps with minimal use of railing and partial squatting for work.     Time 8   Period Weeks   Status New     Additional Long Term Goals   Additional Long Term Goals Yes     PT LONG TERM GOAL #6   Title Circumferential measurements of right and left within 3 cm indicating decreased edema needed for faster healing and return to work.     Time 8   Period Weeks   Status New               Plan - 11/04/16 1227    Clinical Impression Statement Gait normalized as long as pt is not anxious. When she gets anxious she tends to rush and be impulsive. Did excellent today taking her time and being more confident in her movement.  Was able to add leg press today without any increased pain/complaints.   Rehab Potential Good   PT Frequency 3x / week   PT Duration 8 weeks   PT Treatment/Interventions ADLs/Self Care Home Management;Electrical Stimulation;Cryotherapy;Therapeutic exercise;Neuromuscular re-education;Patient/family education;Manual techniques;Taping   PT Next Visit Plan Quad strength, step ups, stretch for hamstring and gastroc. Continue with leg press and try 2# on LAQ exercise.    Consulted and Agree with Plan of Care Patient      Patient will benefit from skilled therapeutic intervention in order to improve the following deficits and impairments:  Abnormal gait, Decreased range of motion, Decreased strength, Increased edema, Difficulty walking, Pain  Visit Diagnosis: Acute pain of left knee  Stiffness of left knee, not elsewhere  classified  Muscle weakness (generalized)  Localized edema     Problem List Patient Active Problem List   Diagnosis Date Noted  . Obese 10/14/2016  . S/P left TKA  10/13/2016    Kymiah Araiza, PTA 11/04/2016, 12:57 PM  [facility] [address] [place], [place], [address] Phone: [phone]   Fax:  [phone]  Name: MALAN MORGANO MRN: [id number] Date of Birth: June 17, 1961

## 2016-11-06 ENCOUNTER — Ambulatory Visit: Payer: PRIVATE HEALTH INSURANCE | Admitting: Physical Therapy

## 2016-11-06 ENCOUNTER — Encounter: Payer: Self-pay | Admitting: Physical Therapy

## 2016-11-06 DIAGNOSIS — R600 Localized edema: Secondary | ICD-10-CM

## 2016-11-06 DIAGNOSIS — M6281 Muscle weakness (generalized): Secondary | ICD-10-CM

## 2016-11-06 DIAGNOSIS — M25562 Pain in left knee: Secondary | ICD-10-CM | POA: Diagnosis not present

## 2016-11-06 DIAGNOSIS — M25662 Stiffness of left knee, not elsewhere classified: Secondary | ICD-10-CM

## 2016-11-06 NOTE — Therapy (Signed)
[facility] [address] [place], [place], [address] Phone: [phone]   Fax:  [phone]  Physical Therapy Treatment  Patient Details  Name: AILEE MEHALIC MRN: [id number] Date of Birth: Jul 26, 1961 Referring Provider: Dr. Alvan Dame  Encounter Date: 11/06/2016      PT End of Session - 11/06/16 0923    Visit Number 8   Date for PT Re-Evaluation 12/11/16   Authorization Type W/C Sharol Siler   Authorization Time Period 18 approved visits   Authorization - Visit Number 8   Authorization - Number of Visits 18   PT Start Time [phone]   PT Stop Time 1025   PT Time Calculation (min) 64 min   Activity Tolerance Patient tolerated treatment well   Behavior During Therapy [facility] for tasks assessed/performed;Anxious      Past Medical History:  Diagnosis Date  . Acute meniscal tear of left knee   . Complication of anesthesia 5/10   severe sore throat   . Numbness    left knee  . OA (osteoarthritis) of knee    oa    Past Surgical History:  Procedure Laterality Date  . CESAREAN SECTION     x 1  . CHONDROPLASTY Left 10/19/2014   Procedure:  LEFT MEDIAL PETELLA CHONDROPLASTY;  Surgeon: [name], MD;  Location: [facility];  Service: Orthopedics;  Laterality: Left;  . DILATION AND CURETTAGE OF UTERUS  1996  . HYSTEROSCOPY W/D&C  05-11-2000  . KNEE ARTHROSCOPY Left 10/19/2014   Procedure: LEFT ARTHROSCOPY KNEE WITH DEBRIEDMENT;  Surgeon: [name], MD;  Location: [facility];  Service: Orthopedics;  Laterality: Left;  . KNEE ARTHROSCOPY WITH LATERAL MENISECTOMY Left 10/19/2014   Procedure: KNEE ARTHROSCOPY WITH LATERAL MENISECTOMY;  Surgeon: [name], MD;  Location: [facility];  Service: Orthopedics;  Laterality: Left;  . ROBOTIC ASSISTED TOTAL HYSTERECTOMY  02/26/2009   partial  . TOTAL KNEE ARTHROPLASTY Right 05-26-2010  . TOTAL KNEE ARTHROPLASTY Left 10/13/2016   Procedure: LEFT TOTAL KNEE ARTHROPLASTY;  Surgeon: Paralee Cancel, MD;  Location: [facility];  Service: Orthopedics;  Laterality: Left;  . TUBAL LIGATION      There were no vitals filed for this visit.      Subjective Assessment - 11/06/16 0923    Subjective Knee is just stiff in the AM.    Currently in Pain? No/denies  Just stiff   Multiple Pain Sites No            [facility] PT Assessment - 11/06/16 0001      AROM   Left Knee Extension 5  Passive 0   Left Knee Flexion 115                     [facility] Adult PT Treatment/Exercise - 11/06/16 0001      Knee/Hip Exercises: Stretches   Active Hamstring Stretch Left;2 reps;30 seconds   Gastroc Stretch Left;4 reps;10 seconds     Knee/Hip Exercises: Aerobic   Stationary Bike L1 x 10 min  PTA present   Nustep L3 x 10 min     Knee/Hip Exercises: Machines for Strengthening   Total Gym Leg Press Seat 7 Bil 55# 2x10,  LTLE  20# 2x10      Knee/Hip Exercises: Standing   Forward Step Up Left;2 sets;10 reps;Hand Hold: 0;Step Height: 6"   Rebounder 3 way weight shifting 1 min each     Knee/Hip Exercises: Seated  Long Arc [employer] Strengthening;Left;2 sets;10 reps   Long Arc [employer] 2 lbs.   Sit to Sand 2 sets;10 reps;without UE support     Cryotherapy   Number Minutes Cryotherapy 10 Minutes   Cryotherapy Location Knee   Type of Cryotherapy Ice pack                  PT Short Term Goals - 11/02/16 1234      PT SHORT TERM GOAL #2   Title The patient will be able to ambulate household distances with a cane, walker for short to medium community distances   Time 4   Period Weeks   Status Achieved           PT Long Term Goals - 10/16/16 1221      PT LONG TERM GOAL #1   Title Pt will be independent  with advanced HEP  12/11/16    Time 8   Period Weeks   Status New     PT LONG TERM GOAL #2   Title The patient will be able to ambulate medium community distances with a cane.   Time 8   Period Weeks    Status On-going     PT LONG TERM GOAL #3   Title Pt will increase AROM left kneeflexion from 5 - 120 degrees for improved mobility in order to ride in car/perform household chores   Time 8   Period Weeks   Status On-going     PT LONG TERM GOAL #4   Title FOTO will improve from 82% limitation to at least 52% for improved functional mobility at home and work   Time 8   Period Weeks   Status New     PT LONG TERM GOAL #5   Title The patient will have 4/5 quad and hamstring strength needed for greater ease ascending and descending steps with minimal use of railing and partial squatting for work.     Time 8   Period Weeks   Status New     Additional Long Term Goals   Additional Long Term Goals Yes     PT LONG TERM GOAL #6   Title Circumferential measurements of right and left within 3 cm indicating decreased edema needed for faster healing and return to work.     Time 8   Period Weeks   Status New               Plan - 11/06/16 [id number]    Clinical Impression Statement AROM of LT knee continues to improve. Pt was able to demonstrate normal mechanics and weight distrbution with functional activiteis like sit to stand. We were also able to increase weight on the leg press and LAQ today.     Rehab Potential Good   Clinical Impairments Affecting Rehab Potential Right TKR 7 years ago   PT Frequency 3x / week   PT Duration 8 weeks   PT Treatment/Interventions ADLs/Self Care Home Management;Electrical Stimulation;Cryotherapy;Therapeutic exercise;Neuromuscular re-education;Patient/family education;Manual techniques;Taping   PT Next Visit Plan Quad strength, step ups, stretch for hamstring and gastroc.     Consulted and Agree with Plan of Care Patient      Patient will benefit from skilled therapeutic intervention in order to improve the following deficits and impairments:  Abnormal gait, Decreased range of motion, Decreased strength, Increased edema, Difficulty walking, Pain  Visit  Diagnosis: Acute pain of left knee  Stiffness of left knee, not elsewhere classified  Muscle weakness (generalized)  Localized edema     Problem List Patient Active Problem List   Diagnosis Date Noted  . Obese 10/14/2016  . S/P left TKA 10/13/2016    Osualdo Hansell, PTA 11/06/2016, 10:20 AM  [facility] [address], [place], [place], [address] Phone: [phone]   Fax:  [phone]  Name: JOSEPHYNE CARLONI MRN: [id number] Date of Birth: [birth year]

## 2016-11-09 ENCOUNTER — Ambulatory Visit: Payer: PRIVATE HEALTH INSURANCE | Admitting: Physical Therapy

## 2016-11-09 DIAGNOSIS — M6281 Muscle weakness (generalized): Secondary | ICD-10-CM

## 2016-11-09 DIAGNOSIS — M25562 Pain in left knee: Secondary | ICD-10-CM

## 2016-11-09 DIAGNOSIS — R269 Unspecified abnormalities of gait and mobility: Secondary | ICD-10-CM

## 2016-11-09 DIAGNOSIS — R600 Localized edema: Secondary | ICD-10-CM

## 2016-11-09 DIAGNOSIS — M25662 Stiffness of left knee, not elsewhere classified: Secondary | ICD-10-CM

## 2016-11-09 NOTE — Therapy (Signed)
[facility] [address], [place], [place], [address] Phone: [phone]   Fax:  [phone]  Physical Therapy Treatment  Patient Details  Name: [name] MRN: [id number] Date of Birth: 1960-10-22 Referring Provider: Dr. Alvan Dame  Encounter Date: 11/09/2016      PT End of Session - 11/09/16 1024    Visit Number 9   Date for PT Re-Evaluation 12/11/16   Authorization Type W/C Sharol Siler   Authorization Time Period 18 approved visits   Authorization - Visit Number 9   Authorization - Number of Visits 18   PT Start Time 1019   PT Stop Time 1110   PT Time Calculation (min) 51 min   Activity Tolerance Patient tolerated treatment well   Behavior During Therapy [facility] for tasks assessed/performed;Anxious      Past Medical History:  Diagnosis Date  . Acute meniscal tear of left knee   . Complication of anesthesia 5/10   severe sore throat   . Numbness    left knee  . OA (osteoarthritis) of knee    oa    Past Surgical History:  Procedure Laterality Date  . CESAREAN SECTION     x 1  . CHONDROPLASTY Left 10/19/2014   Procedure:  LEFT MEDIAL PETELLA CHONDROPLASTY;  Surgeon: [name], MD;  Location: [facility];  Service: Orthopedics;  Laterality: Left;  . DILATION AND CURETTAGE OF UTERUS  1996  . HYSTEROSCOPY W/D&C  05-11-2000  . KNEE ARTHROSCOPY Left 10/19/2014   Procedure: LEFT ARTHROSCOPY KNEE WITH DEBRIEDMENT;  Surgeon: [name], MD;  Location: [facility];  Service: Orthopedics;  Laterality: Left;  . KNEE ARTHROSCOPY WITH LATERAL MENISECTOMY Left 10/19/2014   Procedure: KNEE ARTHROSCOPY WITH LATERAL MENISECTOMY;  Surgeon: [name], MD;  Location: [facility];  Service: Orthopedics;  Laterality: Left;  . ROBOTIC ASSISTED TOTAL HYSTERECTOMY  02/26/2009   partial  . TOTAL KNEE ARTHROPLASTY Right 05-26-2010  . TOTAL KNEE ARTHROPLASTY Left 10/13/2016   Procedure: LEFT TOTAL KNEE ARTHROPLASTY;  Surgeon: Paralee Cancel, MD;  Location: [facility];  Service: Orthopedics;  Laterality: Left;  . TUBAL LIGATION      There were no vitals filed for this visit.      Subjective Assessment - 11/09/16 1022    Subjective knee is stiff and itchy.  States there is a little bit of pain but not bad.     Currently in Pain? Yes   Pain Score 5    Pain Location Knee   Pain Orientation Left   Pain Descriptors / Indicators Sore   Pain Type Surgical pain   Pain Onset 1 to 4 weeks ago   Pain Frequency Intermittent   Aggravating Factors  night time   Pain Relieving Factors ice   Multiple Pain Sites No            [facility] PT Assessment - 11/09/16 0001      Observation/Other Assessments   Focus on Therapeutic Outcomes (FOTO)  66%     AROM   Left Knee Extension 6   Left Knee Flexion 116     Strength   Left Knee Flexion 3+/5   Left Knee Extension 5/5                     [facility] Adult PT Treatment/Exercise - 11/09/16 0001      Knee/Hip Exercises: Stretches   Active Hamstring Stretch Left;2 reps;30 seconds  Gastroc Stretch Left;4 reps;10 seconds     Knee/Hip Exercises: Aerobic   Stationary Bike L1 x 10 min  PT present   Nustep L3 x 10 min     Knee/Hip Exercises: Machines for Strengthening   Total Gym Leg Press Seat 7 Bil 55# 2x10,  LTLE  20# 2x10      Knee/Hip Exercises: Standing   Forward Step Up Left;2 sets;10 reps;Hand Hold: 0;Step Height: 6"   Rebounder 3 way weight shifting 1 min each     Knee/Hip Exercises: Seated   Long Arc Quad --   Long Arc [employer] --     Cryotherapy   Number Minutes Cryotherapy 10 Minutes   Cryotherapy Location Knee   Type of Cryotherapy Ice pack                  PT Short Term Goals - 11/09/16 1025      PT SHORT TERM GOAL #1   Title Pt will be independent with initial HEP   Time 4   Period Weeks   Status Achieved     PT SHORT TERM GOAL #2   Title The patient will be able to  ambulate household distances with a cane, walker for short to medium community distances   Time 4   Period Weeks   Status Achieved     PT SHORT TERM GOAL #3   Title Left knee flexion will increase to at least 100  degrees needed for greater ease getting in/out of the car   Time 4   Period Weeks   Status Achieved           PT Long Term Goals - 11/09/16 1025      PT LONG TERM GOAL #1   Title Pt will be independent  with advanced HEP  12/11/16    Time 8   Period Weeks   Status On-going     PT LONG TERM GOAL #2   Title The patient will be able to ambulate medium community distances with a cane.   Baseline can walk about 10-15 minutes at the most   Time 8   Period Weeks   Status On-going     PT LONG TERM GOAL #3   Title Pt will increase AROM left kneeflexion from 5 - 120 degrees for improved mobility in order to ride in car/perform household chores   Baseline 5-115   Time 8   Period Weeks   Status On-going     PT LONG TERM GOAL #4   Title FOTO will improve from 82% limitation to at least 52% for improved functional mobility at home and work   Baseline current 66% from 82% limitation   Time 8   Period Weeks   Status On-going     PT LONG TERM GOAL #5   Title The patient will have 4/5 quad and hamstring strength needed for greater ease ascending and descending steps with minimal use of railing and partial squatting for work.     Time 8   Period Weeks   Status On-going               Plan - 11/09/16 1313    Clinical Impression Statement Pt able to tolerate a little greater weight on single leg on leg press machine.  Continues to do well with exercises and demonstrates increased AROM knee flexion.  Pt continues to have weakness, decreased ROM, and pain which is limiting her function at this time.  Continues to  need skilled PT to address impairments and return to function for work related duties.   Rehab Potential Good   Clinical Impairments Affecting Rehab Potential  Right TKR 7 years ago   PT Frequency 3x / week   PT Duration 8 weeks   PT Treatment/Interventions ADLs/Self Care Home Management;Electrical Stimulation;Cryotherapy;Therapeutic exercise;Neuromuscular re-education;Patient/family education;Manual techniques;Taping   PT Next Visit Plan Quad strength, step ups, stretch for hamstring and gastroc.     Consulted and Agree with Plan of Care Patient      Patient will benefit from skilled therapeutic intervention in order to improve the following deficits and impairments:  Abnormal gait, Decreased range of motion, Decreased strength, Increased edema, Difficulty walking, Pain  Visit Diagnosis: Acute pain of left knee  Stiffness of left knee, not elsewhere classified  Muscle weakness (generalized)  Localized edema  Abnormal gait     Problem List Patient Active Problem List   Diagnosis Date Noted  . Obese 10/14/2016  . S/P left TKA 10/13/2016    Zannie Cove, PT 11/09/2016, 1:20 PM  [facility] [address] [place], [place], [address] Phone: [phone]   Fax:  [phone]  Name: [name] MRN: [id number] Date of Birth: 10-25-1960

## 2016-11-11 ENCOUNTER — Ambulatory Visit: Payer: PRIVATE HEALTH INSURANCE | Admitting: Physical Therapy

## 2016-11-11 ENCOUNTER — Encounter: Payer: Self-pay | Admitting: Physical Therapy

## 2016-11-11 ENCOUNTER — Ambulatory Visit: Payer: 59 | Admitting: Physical Therapy

## 2016-11-11 DIAGNOSIS — M25562 Pain in left knee: Secondary | ICD-10-CM

## 2016-11-11 DIAGNOSIS — M6281 Muscle weakness (generalized): Secondary | ICD-10-CM

## 2016-11-11 DIAGNOSIS — M25662 Stiffness of left knee, not elsewhere classified: Secondary | ICD-10-CM

## 2016-11-11 NOTE — Therapy (Signed)
[facility] [address], [place], [place], [address] Phone: [phone]   Fax:  [phone]  Physical Therapy Treatment  Patient Details  Name: [name] MRN: [id number] Date of Birth: [birth year] Referring Provider: Dr. Alvan Dame  Encounter Date: 11/11/2016      PT End of Session - 11/11/16 1011    Visit Number 10   Date for PT Re-Evaluation 12/11/16   Authorization Type W/C Sharol Siler   Authorization Time Period 77 approved visits   Authorization - Visit Number 10   Authorization - Number of Visits 18   PT Start Time 1011   PT Stop Time 1105   PT Time Calculation (min) 54 min   Activity Tolerance --  Pt sore after exercises   Behavior During Therapy --      Past Medical History:  Diagnosis Date  . Acute meniscal tear of left knee   . Complication of anesthesia 5/10   severe sore throat   . Numbness    left knee  . OA (osteoarthritis) of knee    oa    Past Surgical History:  Procedure Laterality Date  . CESAREAN SECTION     x 1  . CHONDROPLASTY Left 10/19/2014   Procedure:  LEFT MEDIAL PETELLA CHONDROPLASTY;  Surgeon: [name], MD;  Location: [facility];  Service: Orthopedics;  Laterality: Left;  . DILATION AND CURETTAGE OF UTERUS  1996  . HYSTEROSCOPY W/D&C  05-11-2000  . KNEE ARTHROSCOPY Left 10/19/2014   Procedure: LEFT ARTHROSCOPY KNEE WITH DEBRIEDMENT;  Surgeon: [name], MD;  Location: [facility];  Service: Orthopedics;  Laterality: Left;  . KNEE ARTHROSCOPY WITH LATERAL MENISECTOMY Left 10/19/2014   Procedure: KNEE ARTHROSCOPY WITH LATERAL MENISECTOMY;  Surgeon: [name], MD;  Location: [facility];  Service: Orthopedics;  Laterality: Left;  . ROBOTIC ASSISTED TOTAL HYSTERECTOMY  02/26/2009   partial  . TOTAL KNEE ARTHROPLASTY Right 05-26-2010  . TOTAL KNEE ARTHROPLASTY Left 10/13/2016   Procedure: LEFT TOTAL KNEE ARTHROPLASTY;   Surgeon: Paralee Cancel, MD;  Location: [facility];  Service: Orthopedics;  Laterality: Left;  . TUBAL LIGATION      There were no vitals filed for this visit.      Subjective Assessment - 11/11/16 1012    Subjective (P)  Just stiff and a little sore this AM.   Currently in Pain? (P)  No/denies  Hard, stiff and itchy   Pain Location (P)  Knee   Pain Orientation (P)  Left   Pain Descriptors / Indicators (P)  Tightness   Multiple Pain Sites (P)  No                         [facility] Adult PT Treatment/Exercise - 11/11/16 0001      Knee/Hip Exercises: Stretches   Active Hamstring Stretch Left;2 reps;30 seconds   Gastroc Stretch Left;4 reps;10 seconds     Knee/Hip Exercises: Aerobic   Stationary Bike L1 x 10 min  PT present   Nustep L3 x 10 min     Knee/Hip Exercises: Machines for Strengthening   Total Gym Leg Press Seat #7 Bil 60# 3x10, LT only 25# 2x10      Knee/Hip Exercises: Standing   Forward Step Up Left;2 sets;10 reps;Hand Hold: 0;Step Height: 6"   Rebounder 3 way weight shifting 1 min each   Walking with Sports Cord 20# 10x forward & backward  Moist Heat Therapy   Number Minutes Moist Heat 15 Minutes  post rehab   Moist Heat Location --  Gastroc, hamstring, peripatella     Manual Therapy   Manual Therapy Soft tissue mobilization   Soft tissue mobilization Lt gastroc, hamstring, and peripatella                  PT Short Term Goals - 11/09/16 1025      PT SHORT TERM GOAL #1   Title Pt will be independent with initial HEP   Time 4   Period Weeks   Status Achieved     PT SHORT TERM GOAL #2   Title The patient will be able to ambulate household distances with a cane, walker for short to medium community distances   Time 4   Period Weeks   Status Achieved     PT SHORT TERM GOAL #3   Title Left knee flexion will increase to at least 100  degrees needed for greater ease getting in/out of the car   Time 4   Period Weeks   Status  Achieved           PT Long Term Goals - 11/09/16 1025      PT LONG TERM GOAL #1   Title Pt will be independent  with advanced HEP  12/11/16    Time 8   Period Weeks   Status On-going     PT LONG TERM GOAL #2   Title The patient will be able to ambulate medium community distances with a cane.   Baseline can walk about 10-15 minutes at the most   Time 8   Period Weeks   Status On-going     PT LONG TERM GOAL #3   Title Pt will increase AROM left kneeflexion from 5 - 120 degrees for improved mobility in order to ride in car/perform household chores   Baseline 5-115   Time 8   Period Weeks   Status On-going     PT LONG TERM GOAL #4   Title FOTO will improve from 82% limitation to at least 52% for improved functional mobility at home and work   Baseline current 66% from 82% limitation   Time 8   Period Weeks   Status On-going     PT LONG TERM GOAL #5   Title The patient will have 4/5 quad and hamstring strength needed for greater ease ascending and descending steps with minimal use of railing and partial squatting for work.     Time 8   Period Weeks   Status On-going               Plan - 11/11/16 1053    Clinical Impression Statement Pt given more standing exercises today:  resisted walking forward and backward. Pt performed all exercises well, got slightly anxious and started to rush her exercises when she feels soreness coming on. Pt remains tight in the posterior soft tissues.    Rehab Potential Good   Clinical Impairments Affecting Rehab Potential Right TKR 7 years ago   PT Frequency 3x / week   PT Treatment/Interventions ADLs/Self Care Home Management;Electrical Stimulation;Cryotherapy;Therapeutic exercise;Neuromuscular re-education;Patient/family education;Manual techniques;Taping   PT Next Visit Plan Quad strength, stretching to posterior tissues.   Consulted and Agree with Plan of Care Patient      Patient will benefit from skilled therapeutic  intervention in order to improve the following deficits and impairments:  Abnormal gait, Decreased range of motion, Decreased strength,  Increased edema, Difficulty walking, Pain  Visit Diagnosis: Acute pain of left knee  Stiffness of left knee, not elsewhere classified  Muscle weakness (generalized)     Problem List Patient Active Problem List   Diagnosis Date Noted  . Obese 10/14/2016  . S/P left TKA 10/13/2016    COCHRAN,JENNIFER, PTA 11/11/2016, 10:58 AM  [facility] [address], [place], [place], [address] Phone: [phone]   Fax:  [phone]  Name: [name] MRN: [id number] Date of Birth: 1961-06-09

## 2016-11-13 ENCOUNTER — Ambulatory Visit: Payer: 59 | Attending: Orthopedic Surgery | Admitting: Physical Therapy

## 2016-11-13 ENCOUNTER — Encounter: Payer: Self-pay | Admitting: Physical Therapy

## 2016-11-13 DIAGNOSIS — M25562 Pain in left knee: Secondary | ICD-10-CM | POA: Insufficient documentation

## 2016-11-13 DIAGNOSIS — M25662 Stiffness of left knee, not elsewhere classified: Secondary | ICD-10-CM | POA: Insufficient documentation

## 2016-11-13 DIAGNOSIS — M6281 Muscle weakness (generalized): Secondary | ICD-10-CM | POA: Insufficient documentation

## 2016-11-13 MED FILL — HYDROCODON-APAP 7.5-325: 7.5-325 | 15 days supply | Qty: 60 | Fill #0

## 2016-11-13 NOTE — Therapy (Signed)
[facility] [address] [place], [place], [address] Phone: [phone]   Fax:  [phone]  Physical Therapy Treatment  Patient Details  Name: [name] MRN: [id number] Date of Birth: [birth year] Referring Provider: Dr. Alvan Dame  Encounter Date: 11/13/2016      PT End of Session - 11/13/16 1005    Visit Number 11   Date for PT Re-Evaluation 12/11/16   Authorization Type W/C Sharol Siler   Authorization Time Period 18 approved visits   Authorization - Visit Number 11   Authorization - Number of Visits 18   PT Start Time 1001   PT Stop Time 1100   PT Time Calculation (min) 59 min   Behavior During Therapy [facility] for tasks assessed/performed;Anxious      Past Medical History:  Diagnosis Date  . Acute meniscal tear of left knee   . Complication of anesthesia 5/10   severe sore throat   . Numbness    left knee  . OA (osteoarthritis) of knee    oa    Past Surgical History:  Procedure Laterality Date  . CESAREAN SECTION     x 1  . CHONDROPLASTY Left 10/19/2014   Procedure:  LEFT MEDIAL PETELLA CHONDROPLASTY;  Surgeon: [name], MD;  Location: [facility];  Service: Orthopedics;  Laterality: Left;  . DILATION AND CURETTAGE OF UTERUS  1996  . HYSTEROSCOPY W/D&C  05-11-2000  . KNEE ARTHROSCOPY Left 10/19/2014   Procedure: LEFT ARTHROSCOPY KNEE WITH DEBRIEDMENT;  Surgeon: [name], MD;  Location: [facility];  Service: Orthopedics;  Laterality: Left;  . KNEE ARTHROSCOPY WITH LATERAL MENISECTOMY Left 10/19/2014   Procedure: KNEE ARTHROSCOPY WITH LATERAL MENISECTOMY;  Surgeon: [name], MD;  Location: [facility];  Service: Orthopedics;  Laterality: Left;  . ROBOTIC ASSISTED TOTAL HYSTERECTOMY  02/26/2009   partial  . TOTAL KNEE ARTHROPLASTY Right 05-26-2010  . TOTAL KNEE ARTHROPLASTY Left 10/13/2016   Procedure: LEFT TOTAL KNEE ARTHROPLASTY;  Surgeon: Paralee Cancel, MD;  [facility];  Service: Orthopedics;  Laterality: Left;  . TUBAL LIGATION      There were no vitals filed for this visit.      Subjective Assessment - 11/13/16 1006    Subjective No new complaints, pt motivated to return to work.    Currently in Pain? No/denies   Multiple Pain Sites No                         [facility] Adult PT Treatment/Exercise - 11/13/16 0001      Knee/Hip Exercises: Aerobic   Stationary Bike L1 x 10 min  VC to keep RPM >50   Nustep L3 x 6 min  another pt needed machine     Knee/Hip Exercises: Machines for Strengthening   Total Gym Leg Press Seat #7 Bil 65# 3x 10 LTLE 25# 2x10     Knee/Hip Exercises: Standing   Forward Step Up Left;2 sets;10 reps;Hand Hold: 0;Step Height: 6"   Walking with Sports Cord 20x 8x each way     Knee/Hip Exercises: Seated   Long Arc Quad Strengthening;Left;3 sets;10 reps   Long Arc Quad Weight 3 lbs.     Moist Heat Therapy   Number Minutes Moist Heat 15 Minutes   Moist Heat Location --  Gastroc, hamstring, peripatella     Manual Therapy   Manual Therapy Soft tissue mobilization   Soft tissue mobilization  Distal scar and quad tendon                  PT Short Term Goals - 11/09/16 1025      PT SHORT TERM GOAL #1   Title Pt will be independent with initial HEP   Time 4   Period Weeks   Status Achieved     PT SHORT TERM GOAL #2   Title The patient will be able to ambulate household distances with a cane, walker for short to medium community distances   Time 4   Period Weeks   Status Achieved     PT SHORT TERM GOAL #3   Title Left knee flexion will increase to at least 100  degrees needed for greater ease getting in/out of the car   Time 4   Period Weeks   Status Achieved           PT Long Term Goals - 11/09/16 1025      PT LONG TERM GOAL #1   Title Pt will be independent  with advanced HEP  12/11/16    Time 8   Period Weeks   Status On-going     PT LONG TERM GOAL  #2   Title The patient will be able to ambulate medium community distances with a cane.   Baseline can walk about 10-15 minutes at the most   Time 8   Period Weeks   Status On-going     PT LONG TERM GOAL #3   Title Pt will increase AROM left kneeflexion from 5 - 120 degrees for improved mobility in order to ride in car/perform household chores   Baseline 5-115   Time 8   Period Weeks   Status On-going     PT LONG TERM GOAL #4   Title FOTO will improve from 82% limitation to at least 52% for improved functional mobility at home and work   Baseline current 66% from 82% limitation   Time 8   Period Weeks   Status On-going     PT LONG TERM GOAL #5   Title The patient will have 4/5 quad and hamstring strength needed for greater ease ascending and descending steps with minimal use of railing and partial squatting for work.     Time 8   Period Weeks   Status On-going               Plan - 11/13/16 1006    Clinical Impression Statement Swelling pretty much abolished at this time, excellent scar healing, tolerating more quad strengthening exercises, overall doing very well.    Rehab Potential Good   Clinical Impairments Affecting Rehab Potential Right TKR 7 years ago   PT Frequency 3x / week   PT Duration 8 weeks   PT Treatment/Interventions ADLs/Self Care Home Management;Electrical Stimulation;Cryotherapy;Therapeutic exercise;Neuromuscular re-education;Patient/family education;Manual techniques;Taping   PT Next Visit Plan Quad strength, stretching to posterior tissues.   Consulted and Agree with Plan of Care --      Patient will benefit from skilled therapeutic intervention in order to improve the following deficits and impairments:  Abnormal gait, Decreased range of motion, Decreased strength, Increased edema, Difficulty walking, Pain  Visit Diagnosis: Acute pain of left knee  Stiffness of left knee, not elsewhere classified  Muscle weakness  (generalized)     Problem List Patient Active Problem List   Diagnosis Date Noted  . Obese 10/14/2016  . S/P left TKA 10/13/2016    Kamyia Thomason, PTA 11/13/2016, 10:46  AM  [facility] [address], [place], [place], [address] Phone: [phone]   Fax:  [phone]  Name: KHARTER BORRIES MRN: [id number] Date of Birth: [birth year]

## 2016-11-16 ENCOUNTER — Encounter: Payer: Self-pay | Admitting: Physical Therapy

## 2016-11-16 ENCOUNTER — Ambulatory Visit: Payer: PRIVATE HEALTH INSURANCE | Attending: Orthopedic Surgery | Admitting: Physical Therapy

## 2016-11-16 DIAGNOSIS — M25662 Stiffness of left knee, not elsewhere classified: Secondary | ICD-10-CM | POA: Insufficient documentation

## 2016-11-16 DIAGNOSIS — R600 Localized edema: Secondary | ICD-10-CM | POA: Diagnosis present

## 2016-11-16 DIAGNOSIS — M6281 Muscle weakness (generalized): Secondary | ICD-10-CM | POA: Diagnosis present

## 2016-11-16 DIAGNOSIS — R269 Unspecified abnormalities of gait and mobility: Secondary | ICD-10-CM | POA: Insufficient documentation

## 2016-11-16 DIAGNOSIS — M25562 Pain in left knee: Secondary | ICD-10-CM | POA: Diagnosis not present

## 2016-11-16 NOTE — Therapy (Signed)
[facility] [address] [place], [place], [address] Phone: [phone]   Fax:  [phone]  Physical Therapy Treatment  Patient Details  Name: [name] MRN: [id number] Date of Birth: 06/22/1961 Referring Provider: Dr. Alvan Dame  Encounter Date: 11/16/2016      PT End of Session - 11/16/16 1024    Visit Number 12   Date for PT Re-Evaluation 12/11/16   Authorization Type W/C Sharol Siler   Authorization Time Period 18 approved visits   Authorization - Visit Number 12   Authorization - Number of Visits 18   PT Start Time 1000   PT Stop Time 1055   PT Time Calculation (min) 55 min   Activity Tolerance Patient tolerated treatment well;Patient limited by pain   Behavior During Therapy Restless;Agitated;Anxious      Past Medical History:  Diagnosis Date  . Acute meniscal tear of left knee   . Complication of anesthesia 5/10   severe sore throat   . Numbness    left knee  . OA (osteoarthritis) of knee    oa    Past Surgical History:  Procedure Laterality Date  . CESAREAN SECTION     x 1  . CHONDROPLASTY Left 10/19/2014   Procedure:  LEFT MEDIAL PETELLA CHONDROPLASTY;  Surgeon: [name], MD;  Location: [facility];  Service: Orthopedics;  Laterality: Left;  . DILATION AND CURETTAGE OF UTERUS  1996  . HYSTEROSCOPY W/D&C  05-11-2000  . KNEE ARTHROSCOPY Left 10/19/2014   Procedure: LEFT ARTHROSCOPY KNEE WITH DEBRIEDMENT;  Surgeon: [name], MD;  Location: [facility];  Service: Orthopedics;  Laterality: Left;  . KNEE ARTHROSCOPY WITH LATERAL MENISECTOMY Left 10/19/2014   Procedure: KNEE ARTHROSCOPY WITH LATERAL MENISECTOMY;  Surgeon: [name], MD;  Location: [facility];  Service: Orthopedics;  Laterality: Left;  . ROBOTIC ASSISTED TOTAL HYSTERECTOMY  02/26/2009   partial  . TOTAL KNEE ARTHROPLASTY Right 05-26-2010  . TOTAL KNEE ARTHROPLASTY Left 10/13/2016    Procedure: LEFT TOTAL KNEE ARTHROPLASTY;  Surgeon: Paralee Cancel, MD;  Location: [facility];  Service: Orthopedics;  Laterality: Left;  . TUBAL LIGATION      There were no vitals filed for this visit.      Subjective Assessment - 11/16/16 1021    Subjective Stiff this AM, knee feels hard. Sees MD 2/14. Pt reports her knee doesn't feel good in this cold weather.    Currently in Pain? No/denies   Pain Location Knee   Pain Orientation Left   Pain Descriptors / Indicators Tightness   Aggravating Factors  very stiff in AM.    Pain Relieving Factors Heat, exercises   Multiple Pain Sites No                         [facility] Adult PT Treatment/Exercise - 11/16/16 0001      Knee/Hip Exercises: Aerobic   Stationary Bike L1 x 10 min  VC to keep RPM >50   Nustep L3 x 6 min  another pt needed machine     Knee/Hip Exercises: Machines for Strengthening   Total Gym Leg Press Seat #7 Bil 65 3x10, LTLE 30# 2x10     Knee/Hip Exercises: Standing   Forward Step Up Left;2 sets;10 reps;Hand Hold: 0;Step Height: 6"   SLS 3x 10-15 sec     Knee/Hip Exercises: Seated   Long Arc Quad Strengthening;Left;3 sets;10 reps;Weights   Long  Arc Quad Weight 3 lbs.     Moist Heat Therapy   Number Minutes Moist Heat 15 Minutes   Moist Heat Location Knee     Manual Therapy   Manual Therapy Soft tissue mobilization   Soft tissue mobilization Distal scar and quad tendon  Pt did not tolerate much as she reported painful to touch                PT Education - 11/16/16 1038    Education provided Yes   Education Details HEP progression; single leg stance LT 3x day 10-120 sec   Person(s) Educated Patient   Methods Explanation;Demonstration;Tactile cues;Verbal cues   Comprehension Verbalized understanding;Returned demonstration          PT Short Term Goals - 11/16/16 1026      PT SHORT TERM GOAL #1   Title Pt will be independent with initial HEP   Time 4   Period Weeks   Status  Achieved           PT Long Term Goals - 11/16/16 1026      PT LONG TERM GOAL #2   Title The patient will be able to ambulate medium community distances with a cane.   Time 8   Period Weeks   Status Achieved               Plan - 11/16/16 1025    Clinical Impression Statement Pt reports she doesn't feel great on cold days. She will say "it's not a good day," and then has some difficulties staying on track as the session progresses. Pt was able to tolerate most of her strengthening exercises today but not as much manual as it felt too tender to touch. All STGs are met, progressing towards the long term goals.    Rehab Potential Good   Clinical Impairments Affecting Rehab Potential Right TKR 7 years ago   PT Frequency 3x / week   PT Duration 8 weeks   PT Treatment/Interventions ADLs/Self Care Home Management;Electrical Stimulation;Cryotherapy;Therapeutic exercise;Neuromuscular re-education;Patient/family education;Manual techniques;Taping   PT Next Visit Plan Quad strength, stretching to posterior tissues.   Consulted and Agree with Plan of Care Patient      Patient will benefit from skilled therapeutic intervention in order to improve the following deficits and impairments:  Abnormal gait, Decreased range of motion, Decreased strength, Increased edema, Difficulty walking, Pain  Visit Diagnosis: Acute pain of left knee  Stiffness of left knee, not elsewhere classified  Muscle weakness (generalized)     Problem List Patient Active Problem List   Diagnosis Date Noted  . Obese 10/14/2016  . S/P left TKA 10/13/2016    Ahyan Kreeger, PTA 11/16/2016, 10:53 AM  [facility] [address], [place], [place], [address] Phone: [phone]   Fax:  [phone]  Name: [name] MRN: [id number] Date of Birth: 1961-01-30

## 2016-11-18 ENCOUNTER — Ambulatory Visit: Payer: PRIVATE HEALTH INSURANCE | Admitting: Physical Therapy

## 2016-11-18 ENCOUNTER — Encounter: Payer: Self-pay | Admitting: Physical Therapy

## 2016-11-18 DIAGNOSIS — M6281 Muscle weakness (generalized): Secondary | ICD-10-CM

## 2016-11-18 DIAGNOSIS — M25562 Pain in left knee: Secondary | ICD-10-CM

## 2016-11-18 DIAGNOSIS — M25662 Stiffness of left knee, not elsewhere classified: Secondary | ICD-10-CM

## 2016-11-18 NOTE — Therapy (Signed)
[facility] [address] [place], [place], [address] Phone: [phone]   Fax:  [phone]  Physical Therapy Treatment  Patient Details  Name: [name] MRN: [id number] Date of Birth: 1960/10/18 Referring Provider: Dr. Alvan Dame  Encounter Date: 11/18/2016      PT End of Session - 11/18/16 1016    Visit Number 13   Date for PT Re-Evaluation 12/11/16   Authorization Type W/C Sharol Siler   Authorization Time Period 7 approved visits   Authorization - Visit Number 13   Authorization - Number of Visits 18   PT Start Time 1000   PT Stop Time 1056   PT Time Calculation (min) 56 min   Activity Tolerance Patient tolerated treatment well   Behavior During Therapy [facility] for tasks assessed/performed      Past Medical History:  Diagnosis Date  . Acute meniscal tear of left knee   . Complication of anesthesia 5/10   severe sore throat   . Numbness    left knee  . OA (osteoarthritis) of knee    oa    Past Surgical History:  Procedure Laterality Date  . CESAREAN SECTION     x 1  . CHONDROPLASTY Left 10/19/2014   Procedure:  LEFT MEDIAL PETELLA CHONDROPLASTY;  Surgeon: [name], MD;  Location: [facility];  Service: Orthopedics;  Laterality: Left;  . DILATION AND CURETTAGE OF UTERUS  1996  . HYSTEROSCOPY W/D&C  05-11-2000  . KNEE ARTHROSCOPY Left 10/19/2014   Procedure: LEFT ARTHROSCOPY KNEE WITH DEBRIEDMENT;  Surgeon: [name], MD;  Location: [facility];  Service: Orthopedics;  Laterality: Left;  . KNEE ARTHROSCOPY WITH LATERAL MENISECTOMY Left 10/19/2014   Procedure: KNEE ARTHROSCOPY WITH LATERAL MENISECTOMY;  Surgeon: [name], MD;  Location: [facility];  Service: Orthopedics;  Laterality: Left;  . ROBOTIC ASSISTED TOTAL HYSTERECTOMY  02/26/2009   partial  . TOTAL KNEE ARTHROPLASTY Right 05-26-2010  . TOTAL KNEE ARTHROPLASTY Left 10/13/2016   Procedure:  LEFT TOTAL KNEE ARTHROPLASTY;  Surgeon: Paralee Cancel, MD;  Location: [facility];  Service: Orthopedics;  Laterality: Left;  . TUBAL LIGATION      There were no vitals filed for this visit.      Subjective Assessment - 11/18/16 1017    Subjective Sore from rain, still stiff.   Currently in Pain? Yes   Pain Score 2    Pain Location Knee   Pain Orientation Left   Pain Descriptors / Indicators Sore   Multiple Pain Sites No            [facility] PT Assessment - 11/18/16 0001      AROM   Left Knee Extension 5   Left Knee Flexion 126                     [facility] Adult PT Treatment/Exercise - 11/18/16 0001      Knee/Hip Exercises: Stretches   Gastroc Stretch Left;3 reps;20 seconds     Knee/Hip Exercises: Aerobic   Stationary Bike L1 x 10 min  VC to keep RPM >50   Nustep L3 x 10 min  another pt needed machine     Knee/Hip Exercises: Machines for Strengthening   Total Gym Leg Press Seat 7 Bil 65# 3x10, LTLE 30# 3x10     Knee/Hip Exercises: Standing   Forward Step Up Left;2 sets;10 reps;Hand Hold: 0;Step Height: 6"   Walking with Sports Cord  20x 8x each way     Knee/Hip Exercises: Seated   Long Arc Quad Strengthening;Left;3 sets;10 reps;Weights   Long Arc Quad Weight 4 lbs.     Manual Therapy   Manual Therapy Soft tissue mobilization   Soft tissue mobilization Posterior knee                  PT Short Term Goals - 11/16/16 1026      PT SHORT TERM GOAL #1   Title Pt will be independent with initial HEP   Time 4   Period Weeks   Status Achieved           PT Long Term Goals - 11/16/16 1026      PT LONG TERM GOAL #2   Title The patient will be able to ambulate medium community distances with a cane.   Time 8   Period Weeks   Status Achieved               Plan - 11/18/16 1018    Clinical Impression Statement Pt felt better today than on Monday. She was able to complete more exercises. Focus is mainly on increasing the strength of her  Lt quad. We added some resistance to her LAQ exercise.  Knee flexion AROM significantly improved , extension is better but not as drastic.    Rehab Potential Good   Clinical Impairments Affecting Rehab Potential Right TKR 7 years ago   PT Frequency 3x / week   PT Duration 8 weeks   PT Treatment/Interventions ADLs/Self Care Home Management;Electrical Stimulation;Cryotherapy;Therapeutic exercise;Neuromuscular re-education;Patient/family education;Manual techniques;Taping   PT Next Visit Plan Quad strength, stretching to posterior tissues.   Consulted and Agree with Plan of Care Patient      Patient will benefit from skilled therapeutic intervention in order to improve the following deficits and impairments:  Abnormal gait, Decreased range of motion, Decreased strength, Increased edema, Difficulty walking, Pain  Visit Diagnosis: Acute pain of left knee  Stiffness of left knee, not elsewhere classified  Muscle weakness (generalized)     Problem List Patient Active Problem List   Diagnosis Date Noted  . Obese 10/14/2016  . S/P left TKA 10/13/2016    COCHRAN,JENNIFER, PTA 11/18/2016, 10:57 AM  [facility] [address] [place], [place], [address] Phone: [phone]   Fax:  [phone]  Name: [name] MRN: [id number] Date of Birth: [birth year]

## 2016-11-20 ENCOUNTER — Encounter: Payer: Self-pay | Admitting: Physical Therapy

## 2016-11-20 ENCOUNTER — Ambulatory Visit: Payer: PRIVATE HEALTH INSURANCE | Admitting: Physical Therapy

## 2016-11-20 DIAGNOSIS — M25562 Pain in left knee: Secondary | ICD-10-CM

## 2016-11-20 DIAGNOSIS — M25662 Stiffness of left knee, not elsewhere classified: Secondary | ICD-10-CM

## 2016-11-20 DIAGNOSIS — M6281 Muscle weakness (generalized): Secondary | ICD-10-CM

## 2016-11-20 NOTE — Therapy (Signed)
[facility] [address] [place], [place], [address] Phone: [phone]   Fax:  [phone]  Physical Therapy Treatment  Patient Details  Name: [name] MRN: [id number] Date of Birth: 22-Dec-1960 Referring Provider: Dr. Alvan Dame  Encounter Date: 11/20/2016      PT End of Session - 11/20/16 1009    Visit Number 14   Date for PT Re-Evaluation 12/11/16   Authorization Type W/C Sharol Siler   Authorization Time Period 18 approved visits   Authorization - Visit Number 14   Authorization - Number of Visits 18   PT Start Time 1003   PT Stop Time 1045   PT Time Calculation (min) 42 min   Activity Tolerance Patient tolerated treatment well   Behavior During Therapy [facility] for tasks assessed/performed      Past Medical History:  Diagnosis Date  . Acute meniscal tear of left knee   . Complication of anesthesia 5/10   severe sore throat   . Numbness    left knee  . OA (osteoarthritis) of knee    oa    Past Surgical History:  Procedure Laterality Date  . CESAREAN SECTION     x 1  . CHONDROPLASTY Left 10/19/2014   Procedure:  LEFT MEDIAL PETELLA CHONDROPLASTY;  Surgeon: [name], MD;  Location: [facility];  Service: Orthopedics;  Laterality: Left;  . DILATION AND CURETTAGE OF UTERUS  1996  . HYSTEROSCOPY W/D&C  05-11-2000  . KNEE ARTHROSCOPY Left 10/19/2014   Procedure: LEFT ARTHROSCOPY KNEE WITH DEBRIEDMENT;  Surgeon: [name], MD;  Location: [facility];  Service: Orthopedics;  Laterality: Left;  . KNEE ARTHROSCOPY WITH LATERAL MENISECTOMY Left 10/19/2014   Procedure: KNEE ARTHROSCOPY WITH LATERAL MENISECTOMY;  Surgeon: [name], MD;  Location: [facility];  Service: Orthopedics;  Laterality: Left;  . ROBOTIC ASSISTED TOTAL HYSTERECTOMY  02/26/2009   partial  . TOTAL KNEE ARTHROPLASTY Right 05-26-2010  . TOTAL KNEE ARTHROPLASTY Left 10/13/2016   Procedure:  LEFT TOTAL KNEE ARTHROPLASTY;  Surgeon: Paralee Cancel, MD;  Location: [facility];  Service: Orthopedics;  Laterality: Left;  . TUBAL LIGATION      There were no vitals filed for this visit.      Subjective Assessment - 11/20/16 1010    Subjective Stiff   Currently in Pain? No/denies   Multiple Pain Sites No                         [facility] Adult PT Treatment/Exercise - 11/20/16 0001      Knee/Hip Exercises: Aerobic   Stationary Bike L2 x10 min   Nustep L3 x 10 min  another pt needed machine     Knee/Hip Exercises: Machines for Strengthening   Total Gym Leg Press Seat #7 65 # 3x10, LTLE 30# 3x10     Knee/Hip Exercises: Standing   Forward Step Up Left;2 sets;10 reps;Hand Hold: 0;Step Height: 6"   Walking with Sports Cord 20# 10 4 directions      Knee/Hip Exercises: Seated   Long Arc Quad Strengthening;Left;3 sets;10 reps;Weights   Long Arc Quad Weight 4 lbs.     Manual Therapy   Manual Therapy Soft tissue mobilization   Soft tissue mobilization Posterior knee  Applied biofreeze                  PT Short Term Goals - 11/16/16 1026  PT SHORT TERM GOAL #1   Title Pt will be independent with initial HEP   Time 4   Period Weeks   Status Achieved           PT Long Term Goals - 11/16/16 1026      PT LONG TERM GOAL #2   Title The patient will be able to ambulate medium community distances with a cane.   Time 8   Period Weeks   Status Achieved               Plan - 11/20/16 1027    Clinical Impression Statement Pt with no limitations today, tolerated all exercises well. Added lateral movement today, again tolerated  well.    Rehab Potential Good   Clinical Impairments Affecting Rehab Potential Right TKR 7 years ago   PT Frequency 3x / week   PT Duration 8 weeks   PT Treatment/Interventions ADLs/Self Care Home Management;Electrical Stimulation;Cryotherapy;Therapeutic exercise;Neuromuscular re-education;Patient/family  education;Manual techniques;Taping   PT Next Visit Plan Quad strength, stretching to posterior tissues.   Consulted and Agree with Plan of Care Patient      Patient will benefit from skilled therapeutic intervention in order to improve the following deficits and impairments:  Abnormal gait, Decreased range of motion, Decreased strength, Increased edema, Difficulty walking, Pain  Visit Diagnosis: Acute pain of left knee  Stiffness of left knee, not elsewhere classified  Muscle weakness (generalized)     Problem List Patient Active Problem List   Diagnosis Date Noted  . Obese 10/14/2016  . S/P left TKA 10/13/2016    Myrene Galas, PTA 11/20/16 10:48 AM  11/20/2016, 10:47 AM  [facility] [address] [place], [place], [address] Phone: [phone]   Fax:  [phone]  Name: [name] MRN: [id number] Date of Birth: February 02, 1961

## 2016-11-23 ENCOUNTER — Ambulatory Visit: Payer: PRIVATE HEALTH INSURANCE | Admitting: Physical Therapy

## 2016-11-23 ENCOUNTER — Encounter: Payer: Self-pay | Admitting: Physical Therapy

## 2016-11-23 DIAGNOSIS — M25662 Stiffness of left knee, not elsewhere classified: Secondary | ICD-10-CM

## 2016-11-23 DIAGNOSIS — M6281 Muscle weakness (generalized): Secondary | ICD-10-CM

## 2016-11-23 DIAGNOSIS — M25562 Pain in left knee: Secondary | ICD-10-CM

## 2016-11-23 NOTE — Therapy (Signed)
[facility] [address] [place], [place], [address] Phone: [phone]   Fax:  [phone]  Physical Therapy Treatment  Patient Details  Name: [name] MRN: [id number] Date of Birth: 01-27-1961 Referring Provider: Dr. Alvan Dame  Encounter Date: 11/23/2016      PT End of Session - 11/23/16 1014    Visit Number 15   Date for PT Re-Evaluation 12/11/16   Authorization Type W/C Sharol Siler   Authorization Time Period 43 approved visits   Authorization - Visit Number 15   Authorization - Number of Visits 18   PT Start Time 1000   PT Stop Time 1100   PT Time Calculation (min) 60 min   Activity Tolerance Patient tolerated treatment well   Behavior During Therapy [facility] for tasks assessed/performed      Past Medical History:  Diagnosis Date  . Acute meniscal tear of left knee   . Complication of anesthesia 5/10   severe sore throat   . Numbness    left knee  . OA (osteoarthritis) of knee    oa    Past Surgical History:  Procedure Laterality Date  . CESAREAN SECTION     x 1  . CHONDROPLASTY Left 10/19/2014   Procedure:  LEFT MEDIAL PETELLA CHONDROPLASTY;  Surgeon: [name], MD;  Location: [facility];  Service: Orthopedics;  Laterality: Left;  . DILATION AND CURETTAGE OF UTERUS  1996  . HYSTEROSCOPY W/D&C  05-11-2000  . KNEE ARTHROSCOPY Left 10/19/2014   Procedure: LEFT ARTHROSCOPY KNEE WITH DEBRIEDMENT;  Surgeon: [name], MD;  Location: [facility];  Service: Orthopedics;  Laterality: Left;  . KNEE ARTHROSCOPY WITH LATERAL MENISECTOMY Left 10/19/2014   Procedure: KNEE ARTHROSCOPY WITH LATERAL MENISECTOMY;  Surgeon: [name], MD;  Location: [facility];  Service: Orthopedics;  Laterality: Left;  . ROBOTIC ASSISTED TOTAL HYSTERECTOMY  02/26/2009   partial  . TOTAL KNEE ARTHROPLASTY Right 05-26-2010  . TOTAL KNEE ARTHROPLASTY Left 10/13/2016   Procedure:  LEFT TOTAL KNEE ARTHROPLASTY;  Surgeon: Paralee Cancel, MD;  Location: [facility];  Service: Orthopedics;  Laterality: Left;  . TUBAL LIGATION      There were no vitals filed for this visit.      Subjective Assessment - 11/23/16 1015    Subjective Stiff, itchy, warm   Currently in Pain? No/denies   Multiple Pain Sites No                         [facility] Adult PT Treatment/Exercise - 11/23/16 0001      Therapeutic Activites    Therapeutic Activities Other Therapeutic Activities   Other Therapeutic Activities Transfering linen and pushing linen carts     Knee/Hip Exercises: Aerobic   Stationary Bike L0 x 5 min, L2x 5 min   Nustep L3 x 10 min  another pt needed machine     Knee/Hip Exercises: Machines for Strengthening   Total Gym Leg Press Seat #7 70# 3x10  35# 20x     Knee/Hip Exercises: Standing   Forward Step Up Left;2 sets;10 reps;Hand Hold: 0;Step Height: 6"   Rebounder 3 way weight shifting 1 min   Walking with Sports Cord 25# 6x each direction      Knee/Hip Exercises: Seated   Long Arc Quad Strengthening;Left;3 sets;10 reps;Weights   Long Arc Quad Weight 4 lbs.  PT Short Term Goals - 11/16/16 1026      PT SHORT TERM GOAL #1   Title Pt will be independent with initial HEP   Time 4   Period Weeks   Status Achieved           PT Long Term Goals - 11/23/16 1026      PT LONG TERM GOAL #1   Title Pt will be independent  with advanced HEP  12/11/16    Time 8   Period Weeks   Status On-going               Plan - 11/23/16 1014    Rehab Potential Good   Clinical Impairments Affecting Rehab Potential Right TKR 7 years ago   PT Frequency 3x / week   PT Duration 8 weeks   PT Treatment/Interventions ADLs/Self Care Home Management;Electrical Stimulation;Cryotherapy;Therapeutic exercise;Neuromuscular re-education;Patient/family education;Manual techniques;Taping   Consulted and Agree with Plan of Care Patient       Patient will benefit from skilled therapeutic intervention in order to improve the following deficits and impairments:  Abnormal gait, Decreased range of motion, Decreased strength, Increased edema, Difficulty walking, Pain  Visit Diagnosis: Acute pain of left knee  Stiffness of left knee, not elsewhere classified  Muscle weakness (generalized)     Problem List Patient Active Problem List   Diagnosis Date Noted  . Obese 10/14/2016  . S/P left TKA 10/13/2016    Lylith Bebeau, PTA 11/23/2016, 2:14 PM  [facility] [address] [place], [place], [address] Phone: [phone]   Fax:  [phone]  Name: [name] MRN: [id number] Date of Birth: 04/16/1961

## 2016-11-25 ENCOUNTER — Ambulatory Visit: Payer: PRIVATE HEALTH INSURANCE | Admitting: Physical Therapy

## 2016-11-25 ENCOUNTER — Encounter: Payer: Self-pay | Admitting: Physical Therapy

## 2016-11-25 DIAGNOSIS — M25562 Pain in left knee: Secondary | ICD-10-CM | POA: Diagnosis not present

## 2016-11-25 DIAGNOSIS — M25662 Stiffness of left knee, not elsewhere classified: Secondary | ICD-10-CM

## 2016-11-25 DIAGNOSIS — M6281 Muscle weakness (generalized): Secondary | ICD-10-CM

## 2016-11-25 NOTE — Therapy (Signed)
[facility] [address] [place], [place], [address] Phone: [phone]   Fax:  [phone]  Physical Therapy Treatment  Patient Details  Name: [name] MRN: [id number] Date of Birth: 01/20/1961 Referring Provider: Dr. Alvan Dame  Encounter Date: 11/25/2016      PT End of Session - 11/25/16 1149    Visit Number 16   Date for PT Re-Evaluation 12/11/16   Authorization Type W/C Sharol Siler   Authorization Time Period 18 approved visits   Authorization - Visit Number 64   Authorization - Number of Visits 18   PT Start Time 1130   PT Stop Time 1218   PT Time Calculation (min) 48 min   Activity Tolerance Patient tolerated treatment well   Behavior During Therapy [facility] for tasks assessed/performed      Past Medical History:  Diagnosis Date  . Acute meniscal tear of left knee   . Complication of anesthesia 5/10   severe sore throat   . Numbness    left knee  . OA (osteoarthritis) of knee    oa    Past Surgical History:  Procedure Laterality Date  . CESAREAN SECTION     x 1  . CHONDROPLASTY Left 10/19/2014   Procedure:  LEFT MEDIAL PETELLA CHONDROPLASTY;  Surgeon: [name], MD;  Location: [facility];  Service: Orthopedics;  Laterality: Left;  . DILATION AND CURETTAGE OF UTERUS  1996  . HYSTEROSCOPY W/D&C  05-11-2000  . KNEE ARTHROSCOPY Left 10/19/2014   Procedure: LEFT ARTHROSCOPY KNEE WITH DEBRIEDMENT;  Surgeon: [name], MD;  Location: [facility];  Service: Orthopedics;  Laterality: Left;  . KNEE ARTHROSCOPY WITH LATERAL MENISECTOMY Left 10/19/2014   Procedure: KNEE ARTHROSCOPY WITH LATERAL MENISECTOMY;  Surgeon: [name], MD;  Location: [facility];  Service: Orthopedics;  Laterality: Left;  . ROBOTIC ASSISTED TOTAL HYSTERECTOMY  02/26/2009   partial  . TOTAL KNEE ARTHROPLASTY Right 05-26-2010  . TOTAL KNEE ARTHROPLASTY Left 10/13/2016   Procedure:  LEFT TOTAL KNEE ARTHROPLASTY;  Surgeon: Paralee Cancel, MD;  Location: [facility];  Service: Orthopedics;  Laterality: Left;  . TUBAL LIGATION      There were no vitals filed for this visit.      Subjective Assessment - 11/25/16 1150    Subjective Saw MD this AM. MD pleased. Pt will return to work 12/07/16 for 4 hrs 2 weeks, then 6 hrs x 2 weeks, then back to full 8 hrs.    Currently in Pain? No/denies   Multiple Pain Sites No                         [facility] Adult PT Treatment/Exercise - 11/25/16 0001      Knee/Hip Exercises: Aerobic   Stationary Bike L0 x 5 min, then L2 x 5 min   Nustep L3 x 10 min  another pt needed machine     Knee/Hip Exercises: Machines for Strengthening   Total Gym Leg Press Seat #7 70# 3x10  35# 10x     Knee/Hip Exercises: Standing   Forward Step Up Left;2 sets;10 reps;Hand Hold: 0;Step Height: 6"   Rebounder 3 way weight shifting 1 min   Walking with Sports Cord 25# 6x each direction      Knee/Hip Exercises: Seated   Long Arc Quad Strengthening;Left;3 sets;10 reps;Weights   Long Arc Quad Weight 4 lbs.  PT Short Term Goals - 11/16/16 1026      PT SHORT TERM GOAL #1   Title Pt will be independent with initial HEP   Time 4   Period Weeks   Status Achieved           PT Long Term Goals - 11/25/16 1223      PT LONG TERM GOAL #1   Title Pt will be independent  with advanced HEP  12/11/16    Time 8   Period Weeks   Status On-going               Plan - 11/25/16 1219    Clinical Impression Statement Pt saw MD this morning with good report. MD very pleased having pt return to work 12/07/16 at 4 hrs/day x2 weeks, then gradually progressing back to 8 hours. Remains with Lt quad weakness.    Rehab Potential Good   Clinical Impairments Affecting Rehab Potential Right TKR 7 years ago   PT Frequency 3x / week   PT Duration 8 weeks   PT Treatment/Interventions ADLs/Self Care Home Management;Electrical  Stimulation;Cryotherapy;Therapeutic exercise;Neuromuscular re-education;Patient/family education;Manual techniques;Taping   PT Next Visit Plan MMT Lt knee, Try mini squatting activites to simulate work duties like putting sheet on a bed. Take circumferential measurements for LTG #6.   Consulted and Agree with Plan of Care Patient      Patient will benefit from skilled therapeutic intervention in order to improve the following deficits and impairments:  Abnormal gait, Decreased range of motion, Decreased strength, Increased edema, Difficulty walking, Pain  Visit Diagnosis: Acute pain of left knee  Stiffness of left knee, not elsewhere classified  Muscle weakness (generalized)     Problem List Patient Active Problem List   Diagnosis Date Noted  . Obese 10/14/2016  . S/P left TKA 10/13/2016    Rashmi Tallent, PTA 11/25/2016, 12:25 PM  [place] [facility] [address] [place], [place], [address] Phone: [phone]   Fax:  [phone]  Name: [name] MRN: [id number] Date of Birth: [birth year]

## 2016-11-27 ENCOUNTER — Encounter: Payer: Self-pay | Admitting: Physical Therapy

## 2016-11-27 ENCOUNTER — Ambulatory Visit: Payer: PRIVATE HEALTH INSURANCE | Admitting: Physical Therapy

## 2016-11-27 DIAGNOSIS — R269 Unspecified abnormalities of gait and mobility: Secondary | ICD-10-CM

## 2016-11-27 DIAGNOSIS — M25562 Pain in left knee: Secondary | ICD-10-CM

## 2016-11-27 DIAGNOSIS — M25662 Stiffness of left knee, not elsewhere classified: Secondary | ICD-10-CM

## 2016-11-27 DIAGNOSIS — M6281 Muscle weakness (generalized): Secondary | ICD-10-CM

## 2016-11-27 DIAGNOSIS — R600 Localized edema: Secondary | ICD-10-CM

## 2016-11-27 NOTE — Therapy (Signed)
[facility] [address] [place], [place], [address] Phone: [phone]   Fax:  [phone]  Physical Therapy Treatment  Patient Details  Name: [name] MRN: [id number] Date of Birth: 24-Apr-1961 Referring Provider: Dr. Alvan Dame  Encounter Date: 11/27/2016      PT End of Session - 11/27/16 1015    Visit Number 17   Date for PT Re-Evaluation 12/11/16   Authorization Type W/C Sharol Siler   Authorization Time Period 43 approved visits   Authorization - Visit Number 29   Authorization - Number of Visits 18   PT Start Time [phone]   PT Stop Time 1037   PT Time Calculation (min) 39 min   Activity Tolerance Patient tolerated treatment well   Behavior During Therapy [facility] for tasks assessed/performed      Past Medical History:  Diagnosis Date  . Acute meniscal tear of left knee   . Complication of anesthesia 5/10   severe sore throat   . Numbness    left knee  . OA (osteoarthritis) of knee    oa    Past Surgical History:  Procedure Laterality Date  . CESAREAN SECTION     x 1  . CHONDROPLASTY Left 10/19/2014   Procedure:  LEFT MEDIAL PETELLA CHONDROPLASTY;  Surgeon: [name], MD;  Location: [facility];  Service: Orthopedics;  Laterality: Left;  . DILATION AND CURETTAGE OF UTERUS  1996  . HYSTEROSCOPY W/D&C  05-11-2000  . KNEE ARTHROSCOPY Left 10/19/2014   Procedure: LEFT ARTHROSCOPY KNEE WITH DEBRIEDMENT;  Surgeon: [name], MD;  Location: [facility];  Service: Orthopedics;  Laterality: Left;  . KNEE ARTHROSCOPY WITH LATERAL MENISECTOMY Left 10/19/2014   Procedure: KNEE ARTHROSCOPY WITH LATERAL MENISECTOMY;  Surgeon: [name], MD;  Location: [facility];  Service: Orthopedics;  Laterality: Left;  . ROBOTIC ASSISTED TOTAL HYSTERECTOMY  02/26/2009   partial  . TOTAL KNEE ARTHROPLASTY Right 05-26-2010  . TOTAL KNEE ARTHROPLASTY Left 10/13/2016   Procedure:  LEFT TOTAL KNEE ARTHROPLASTY;  Surgeon: Paralee Cancel, MD;  Location: [facility];  Service: Orthopedics;  Laterality: Left;  . TUBAL LIGATION      There were no vitals filed for this visit.      Subjective Assessment - 11/27/16 1014    Subjective If I could just get rid of the stiffness I'd be good.   Currently in Pain? No/denies                         [facility] Adult PT Treatment/Exercise - 11/27/16 0001      Knee/Hip Exercises: Aerobic   Stationary Bike L0 x 5 min, then L2 x 5 min   Nustep L3 x 10 min  another pt needed machine     Knee/Hip Exercises: Machines for Strengthening   Total Gym Leg Press Seat #7 70# 3x10  35# 10x     Knee/Hip Exercises: Standing   Forward Step Up Left;2 sets;10 reps;Hand Hold: 0;Step Height: 6"   Rebounder 3 way weight shifting 1 min   Walking with Sports Cord 25# 6x each direction      Knee/Hip Exercises: Seated   Long Arc Quad Strengthening;Left;3 sets;10 reps;Weights   Long Arc Quad Weight 4 lbs.                  PT Short Term Goals - 11/16/16 1026      PT SHORT TERM  GOAL #1   Title Pt will be independent with initial HEP   Time 4   Period Weeks   Status Achieved           PT Long Term Goals - 11/25/16 1223      PT LONG TERM GOAL #1   Title Pt will be independent  with advanced HEP  12/11/16    Time 8   Period Weeks   Status On-going               Plan - 11/27/16 1042    Clinical Impression Statement Pt did well with exericses and demonstrates some fatigue needing cues to take breaks when quads fatigued.  Patient fatigued at the end of treatment and did not attempt squats.  Will address these first at next session and take measurements to re-assess goals.    Rehab Potential Good   Clinical Impairments Affecting Rehab Potential Right TKR 7 years ago   PT Frequency 3x / week   PT Duration 8 weeks   PT Treatment/Interventions ADLs/Self Care Home Management;Electrical  Stimulation;Cryotherapy;Therapeutic exercise;Neuromuscular re-education;Patient/family education;Manual techniques;Taping   PT Next Visit Plan MMT Lt knee, Try mini squatting activites to simulate work duties like putting sheet on a bed. Take circumferential measurements for LTG #6.   Consulted and Agree with Plan of Care Patient      Patient will benefit from skilled therapeutic intervention in order to improve the following deficits and impairments:  Abnormal gait, Decreased range of motion, Decreased strength, Increased edema, Difficulty walking, Pain  Visit Diagnosis: Acute pain of left knee  Stiffness of left knee, not elsewhere classified  Muscle weakness (generalized)  Localized edema  Abnormal gait     Problem List Patient Active Problem List   Diagnosis Date Noted  . Obese 10/14/2016  . S/P left TKA 10/13/2016    Zannie Cove, PT 11/27/2016, 10:48 AM  [facility] [address] [place], [place], [address] Phone: [phone]   Fax:  [phone]  Name: [name] MRN: [id number] Date of Birth: 01/29/1961

## 2016-11-30 ENCOUNTER — Ambulatory Visit: Payer: PRIVATE HEALTH INSURANCE | Admitting: Physical Therapy

## 2016-12-02 ENCOUNTER — Ambulatory Visit: Payer: PRIVATE HEALTH INSURANCE | Admitting: Physical Therapy

## 2016-12-02 ENCOUNTER — Encounter: Payer: Self-pay | Admitting: Physical Therapy

## 2016-12-02 DIAGNOSIS — M6281 Muscle weakness (generalized): Secondary | ICD-10-CM

## 2016-12-02 DIAGNOSIS — M25662 Stiffness of left knee, not elsewhere classified: Secondary | ICD-10-CM

## 2016-12-02 DIAGNOSIS — M25562 Pain in left knee: Secondary | ICD-10-CM

## 2016-12-02 NOTE — Therapy (Signed)
[facility] [address] [place], [place], [address] Phone: [phone]   Fax:  [phone]  Physical Therapy Treatment  Patient Details  Name: [name] MRN: [id number] Date of Birth: 08/13/1961 Referring Provider: Dr. Alvan Dame  Encounter Date: 12/02/2016      PT End of Session - 12/02/16 1059    Visit Number 18   Date for PT Re-Evaluation 12/11/16   Authorization Type W/C Sharol Siler   Authorization Time Period 8 more visits approved for total 26   Authorization - Visit Number 18   Authorization - Number of Visits 26   PT Start Time 1055   PT Stop Time 1200   PT Time Calculation (min) 65 min   Activity Tolerance --   Behavior During Therapy WFL for tasks assessed/performed      Past Medical History:  Diagnosis Date  . Acute meniscal tear of left knee   . Complication of anesthesia 5/10   severe sore throat   . Numbness    left knee  . OA (osteoarthritis) of knee    oa    Past Surgical History:  Procedure Laterality Date  . CESAREAN SECTION     x 1  . CHONDROPLASTY Left 10/19/2014   Procedure:  LEFT MEDIAL PETELLA CHONDROPLASTY;  Surgeon: [name], MD;  Location: [facility];  Service: Orthopedics;  Laterality: Left;  . DILATION AND CURETTAGE OF UTERUS  1996  . HYSTEROSCOPY W/D&C  05-11-2000  . KNEE ARTHROSCOPY Left 10/19/2014   Procedure: LEFT ARTHROSCOPY KNEE WITH DEBRIEDMENT;  Surgeon: [name], MD;  Location: [facility];  Service: Orthopedics;  Laterality: Left;  . KNEE ARTHROSCOPY WITH LATERAL MENISECTOMY Left 10/19/2014   Procedure: KNEE ARTHROSCOPY WITH LATERAL MENISECTOMY;  Surgeon: [name], MD;  Location: [facility];  Service: Orthopedics;  Laterality: Left;  . ROBOTIC ASSISTED TOTAL HYSTERECTOMY  02/26/2009   partial  . TOTAL KNEE ARTHROPLASTY Right 05-26-2010  . TOTAL KNEE ARTHROPLASTY Left 10/13/2016   Procedure: LEFT TOTAL  KNEE ARTHROPLASTY;  Surgeon: Paralee Cancel, MD;  Location: [facility];  Service: Orthopedics;  Laterality: Left;  . TUBAL LIGATION      There were no vitals filed for this visit.      Subjective Assessment - 12/02/16 1101    Subjective Sitff and hard, I go back to work Monday.   Currently in Pain? No/denies   Multiple Pain Sites No                         [facility] Adult PT Treatment/Exercise - 12/02/16 0001      Knee/Hip Exercises: Aerobic   Stationary Bike --   Elliptical L1 x 3 min   Nustep L4 x 10 min     Knee/Hip Exercises: Machines for Strengthening   Total Gym Leg Press Seat 7 75# 3x10, LTLE 35# 20x   VC to contract the quad      Knee/Hip Exercises: Standing   Forward Step Up Left;2 sets;10 reps;Hand Hold: 0;Step Height: 6"   Rebounder 3 way weight shifting 1 min   Walking with Sports Cord 25# 8x each direction      Knee/Hip Exercises: Seated   Long Arc Quad Strengthening;Left;3 sets;10 reps;Weights   Long Arc Quad Weight 4 lbs.     Vasopneumatic   Number Minutes Vasopneumatic  10 minutes   Vasopnuematic Location  Knee   Vasopneumatic Pressure Medium  Vasopneumatic Temperature  3 flakes     Manual Therapy   Soft tissue mobilization Posterior knee/hamstring soft tissue work with biofreeze                  PT Short Term Goals - 11/16/16 1026      PT SHORT TERM GOAL #1   Title Pt will be independent with initial HEP   Time 4   Period Weeks   Status Achieved           PT Long Term Goals - 12/02/16 1146      PT LONG TERM GOAL #1   Title Pt will be independent  with advanced HEP  12/11/16    Time 8   Period Weeks   Status On-going               Plan - 12/02/16 1143    Clinical Impression Statement Increased weight on the leg press and repetitions on the resisted walking. Pt also tolerated 2 min on the eliptical today. Pt agreed to try the vasopneutmatic machine at  the conclusion of session due to the "heat" in her knee.     Rehab Potential Good   Clinical Impairments Affecting Rehab Potential Right TKR 7 years ago   PT Frequency 3x / week   PT Duration 8 weeks   PT Treatment/Interventions ADLs/Self Care Home Management;Electrical Stimulation;Cryotherapy;Therapeutic exercise;Neuromuscular re-education;Patient/family education;Manual techniques;Taping   PT Next Visit Plan MMT quad next session and take ROM. Try squatting activities next, 3 min on eliptical   Consulted and Agree with Plan of Care Patient      Patient will benefit from skilled therapeutic intervention in order to improve the following deficits and impairments:  Abnormal gait, Decreased range of motion, Decreased strength, Increased edema, Difficulty walking, Pain  Visit Diagnosis: Acute pain of left knee  Stiffness of left knee, not elsewhere classified  Muscle weakness (generalized)     Problem List Patient Active Problem List   Diagnosis Date Noted  . Obese 10/14/2016  . S/P left TKA 10/13/2016    [name], PTA 12/02/2016, 11:48 AM  [facility] [address], [place], [place], [address] Phone: [phone]   Fax:  [phone]  Name: [name] MRN: [id number] Date of Birth: 1961-01-25

## 2016-12-04 ENCOUNTER — Ambulatory Visit: Payer: PRIVATE HEALTH INSURANCE | Admitting: Physical Therapy

## 2016-12-04 ENCOUNTER — Encounter: Payer: Self-pay | Admitting: Physical Therapy

## 2016-12-04 DIAGNOSIS — M6281 Muscle weakness (generalized): Secondary | ICD-10-CM

## 2016-12-04 DIAGNOSIS — R600 Localized edema: Secondary | ICD-10-CM

## 2016-12-04 DIAGNOSIS — M25562 Pain in left knee: Secondary | ICD-10-CM | POA: Diagnosis not present

## 2016-12-04 DIAGNOSIS — M25662 Stiffness of left knee, not elsewhere classified: Secondary | ICD-10-CM

## 2016-12-04 NOTE — Therapy (Signed)
[facility] [address] [place], [place], [address] Phone: [phone]   Fax:  [phone]  Physical Therapy Treatment  Patient Details  Name: [name] MRN: [id number] Date of Birth: 02-Oct-1961 Referring Provider: Dr. Alvan Dame  Encounter Date: 12/04/2016      PT End of Session - 12/04/16 0832    Visit Number 19   Date for PT Re-Evaluation 12/11/16   Authorization Type W/C Sharol Siler   Authorization Time Period 8 more visits approved for total 26   Authorization - Visit Number 86   Authorization - Number of Visits 26   PT Start Time 0831   PT Stop Time 0940   PT Time Calculation (min) 69 min   Activity Tolerance Patient tolerated treatment well   Behavior During Therapy [facility] for tasks assessed/performed      Past Medical History:  Diagnosis Date  . Acute meniscal tear of left knee   . Complication of anesthesia 5/10   severe sore throat   . Numbness    left knee  . OA (osteoarthritis) of knee    oa    Past Surgical History:  Procedure Laterality Date  . CESAREAN SECTION     x 1  . CHONDROPLASTY Left 10/19/2014   Procedure:  LEFT MEDIAL PETELLA CHONDROPLASTY;  Surgeon: [name], MD;  Location: [facility];  Service: Orthopedics;  Laterality: Left;  . DILATION AND CURETTAGE OF UTERUS  1996  . HYSTEROSCOPY W/D&C  05-11-2000  . KNEE ARTHROSCOPY Left 10/19/2014   Procedure: LEFT ARTHROSCOPY KNEE WITH DEBRIEDMENT;  Surgeon: [name], MD;  Location: [facility];  Service: Orthopedics;  Laterality: Left;  . KNEE ARTHROSCOPY WITH LATERAL MENISECTOMY Left 10/19/2014   Procedure: KNEE ARTHROSCOPY WITH LATERAL MENISECTOMY;  Surgeon: [name], MD;  Location: [facility];  Service: Orthopedics;  Laterality: Left;  . ROBOTIC ASSISTED TOTAL HYSTERECTOMY  02/26/2009   partial  . TOTAL KNEE ARTHROPLASTY Right 05-26-2010  . TOTAL KNEE ARTHROPLASTY Left  10/13/2016   Procedure: LEFT TOTAL KNEE ARTHROPLASTY;  Surgeon: Paralee Cancel, MD;  Location: [facility];  Service: Orthopedics;  Laterality: Left;  . TUBAL LIGATION      There were no vitals filed for this visit.      Subjective Assessment - 12/04/16 0834    Subjective I do like the Game Ready, that cold made my knee feel good.    Currently in Pain? No/denies  Stiff and hard   Multiple Pain Sites No            [facility] PT Assessment - 12/04/16 0001      AROM   Left Knee Extension 1   Left Knee Flexion 126     Strength   Left Knee Flexion 4+/5   Left Knee Extension 5/5                     [facility] Adult PT Treatment/Exercise - 12/04/16 0001      Knee/Hip Exercises: Aerobic   Elliptical L2 x 3 min  PTA present for encouragement   Nustep L4 x 10 min  PTA present      Knee/Hip Exercises: Machines for Strengthening   Total Gym Leg Press Seat 6 75# 3x10, LTLE 40# 20x   VC to contract the quad      Knee/Hip Exercises: Standing   Forward Step Up Left;2 sets;10 reps  On BOSU 2x10   Rebounder 3 way  weight shifting 1 min   Walking with Sports Cord 25# 8x each direction      Knee/Hip Exercises: Seated   Long Arc Quad Strengthening;Left;3 sets;10 reps;Weights   Long Arc Quad Weight 4 lbs.     Vasopneumatic   Number Minutes Vasopneumatic  15 minutes   Vasopnuematic Location  Knee   Vasopneumatic Pressure High   Vasopneumatic Temperature  3 flakes                  PT Short Term Goals - 11/16/16 1026      PT SHORT TERM GOAL #1   Title Pt will be independent with initial HEP   Time 4   Period Weeks   Status Achieved           PT Long Term Goals - 12/04/16 0909      PT LONG TERM GOAL #5   Title The patient will have 4/5 quad and hamstring strength needed for greater ease ascending and descending steps with minimal use of railing and partial squatting for work.     Time 8   Period Weeks   Status Achieved               Plan - 12/04/16  [id number]    Clinical Impression Statement Added step ups on compliant surface today. Pt tried to add additional minute on the eliptical trainer today but pt could not do.  Pt was agreeable to moving her seat up on the leg press to increase work on her quad.  MMT significantly improved. Her AROM also improved into extension.   Clinical Impairments Affecting Rehab Potential Right TKR 7 years ago   PT Frequency 3x / week   PT Duration 8 weeks   PT Treatment/Interventions ADLs/Self Care Home Management;Electrical Stimulation;Cryotherapy;Therapeutic exercise;Neuromuscular re-education;Patient/family education;Manual techniques;Taping   PT Next Visit Plan Knee exercises in standing, squats, progress on eliptical.    Consulted and Agree with Plan of Care --      Patient will benefit from skilled therapeutic intervention in order to improve the following deficits and impairments:  Abnormal gait, Decreased range of motion, Decreased strength, Increased edema, Difficulty walking, Pain  Visit Diagnosis: Acute pain of left knee  Stiffness of left knee, not elsewhere classified  Muscle weakness (generalized)  Localized edema     Problem List Patient Active Problem List   Diagnosis Date Noted  . Obese 10/14/2016  . S/P left TKA 10/13/2016    Alexias Margerum, PTA 12/04/2016, 9:27 AM  [facility] [address] [place], [place], [address] Phone: [phone]   Fax:  [phone]  Name: [name] MRN: [id number] Date of Birth: February 08, 1961

## 2016-12-07 ENCOUNTER — Encounter: Payer: Self-pay | Admitting: Physical Therapy

## 2016-12-07 ENCOUNTER — Ambulatory Visit: Payer: PRIVATE HEALTH INSURANCE | Admitting: Physical Therapy

## 2016-12-07 DIAGNOSIS — M25562 Pain in left knee: Secondary | ICD-10-CM

## 2016-12-07 DIAGNOSIS — M25662 Stiffness of left knee, not elsewhere classified: Secondary | ICD-10-CM

## 2016-12-07 DIAGNOSIS — M6281 Muscle weakness (generalized): Secondary | ICD-10-CM

## 2016-12-07 DIAGNOSIS — R600 Localized edema: Secondary | ICD-10-CM

## 2016-12-07 NOTE — Therapy (Signed)
[facility] [address], [place], [place], [address] Phone: [phone]   Fax:  [phone]  Physical Therapy Treatment  Patient Details  Name: [name] MRN: [id number] Date of Birth: October 07, 1961 Referring Provider: Dr. Alvan Dame  Encounter Date: 12/07/2016      PT End of Session - 12/07/16 1541    Visit Number 20   Date for PT Re-Evaluation 12/11/16   Authorization Type W/C Sharol Siler   Authorization Time Period 8 more visits approved for total 26   Authorization - Visit Number 38   Authorization - Number of Visits 26   PT Start Time [id number]   PT Stop Time 1615   PT Time Calculation (min) 60 min   Activity Tolerance Patient tolerated treatment well   Behavior During Therapy [facility] for tasks assessed/performed      Past Medical History:  Diagnosis Date  . Acute meniscal tear of left knee   . Complication of anesthesia 5/10   severe sore throat   . Numbness    left knee  . OA (osteoarthritis) of knee    oa    Past Surgical History:  Procedure Laterality Date  . CESAREAN SECTION     x 1  . CHONDROPLASTY Left 10/19/2014   Procedure:  LEFT MEDIAL PETELLA CHONDROPLASTY;  Surgeon: [name], MD;  Location: [facility];  Service: Orthopedics;  Laterality: Left;  . DILATION AND CURETTAGE OF UTERUS  1996  . HYSTEROSCOPY W/D&C  05-11-2000  . KNEE ARTHROSCOPY Left 10/19/2014   Procedure: LEFT ARTHROSCOPY KNEE WITH DEBRIEDMENT;  Surgeon: [name], MD;  Location: [facility];  Service: Orthopedics;  Laterality: Left;  . KNEE ARTHROSCOPY WITH LATERAL MENISECTOMY Left 10/19/2014   Procedure: KNEE ARTHROSCOPY WITH LATERAL MENISECTOMY;  Surgeon: [name], MD;  Location: [facility];  Service: Orthopedics;  Laterality: Left;  . ROBOTIC ASSISTED TOTAL HYSTERECTOMY  02/26/2009   partial  . TOTAL KNEE ARTHROPLASTY Right 05-26-2010  . TOTAL KNEE ARTHROPLASTY Left  10/13/2016   Procedure: LEFT TOTAL KNEE ARTHROPLASTY;  Surgeon: Paralee Cancel, MD;  Location: [facility];  Service: Orthopedics;  Laterality: Left;  . TUBAL LIGATION      There were no vitals filed for this visit.      Subjective Assessment - 12/07/16 1542    Subjective They worked me hard at work today!    Currently in Pain? --  Knee itchy and hard at patella   Multiple Pain Sites No                         [facility] Adult PT Treatment/Exercise - 12/07/16 0001      Knee/Hip Exercises: Stretches   Gastroc Stretch Left;3 reps;20 seconds     Knee/Hip Exercises: Aerobic   Recumbent Bike L2 x 10 min     Knee/Hip Exercises: Machines for Strengthening   Total Gym Leg Press Seat 6, Bil 80# 3x10, LTLE 40# 2x10     Knee/Hip Exercises: Standing   Forward Step Up Left;2 sets;10 reps  On BOSU 2x10     Knee/Hip Exercises: Seated   Long Arc Quad Strengthening;Left;3 sets;10 reps;Weights   Long Arc Quad Weight 4 lbs.     Vasopneumatic   Number Minutes Vasopneumatic  15 minutes   Vasopnuematic Location  Knee   Vasopneumatic Pressure High   Vasopneumatic Temperature  3 flakes     Manual Therapy   Soft tissue  mobilization Posterior knee/hamstring soft tissue work with biofreeze                  PT Short Term Goals - 11/16/16 1026      PT SHORT TERM GOAL #1   Title Pt will be independent with initial HEP   Time 4   Period Weeks   Status Achieved           PT Long Term Goals - 12/04/16 0909      PT LONG TERM GOAL #5   Title The patient will have 4/5 quad and hamstring strength needed for greater ease ascending and descending steps with minimal use of railing and partial squatting for work.     Time 8   Period Weeks   Status Achieved               Plan - 12/07/16 1540    Clinical Impression Statement Pt returned to work today for 4 hours. She was fatigued and a little stiff in the knee but not very much pain was reported. Some extra edema was  present but that was expected from being on her feet for longer hours. Pt  was able to tolerate more weight on the leg press  and do the compliant surface step ups today.    Rehab Potential Good   Clinical Impairments Affecting Rehab Potential Right TKR 7 years ago   PT Frequency 3x / week   PT Duration 8 weeks   PT Treatment/Interventions ADLs/Self Care Home Management;Electrical Stimulation;Cryotherapy;Therapeutic exercise;Neuromuscular re-education;Patient/family education;Manual techniques;Taping   PT Next Visit Plan Knee exercises in standing, squats, progress on eliptical, game ready post work.    Consulted and Agree with Plan of Care --      Patient will benefit from skilled therapeutic intervention in order to improve the following deficits and impairments:  Abnormal gait, Decreased range of motion, Decreased strength, Increased edema, Difficulty walking, Pain  Visit Diagnosis: Acute pain of left knee  Stiffness of left knee, not elsewhere classified  Muscle weakness (generalized)  Localized edema  Left knee pain, unspecified chronicity     Problem List Patient Active Problem List   Diagnosis Date Noted  . Obese 10/14/2016  . S/P left TKA 10/13/2016    Lamont Glasscock, PTA 12/07/2016, 4:02 PM  [facility] [address] [place], [place], [address] Phone: [phone]   Fax:  [phone]  Name: [name] MRN: [id number] Date of Birth: Feb 27, 1961

## 2016-12-09 ENCOUNTER — Ambulatory Visit: Payer: PRIVATE HEALTH INSURANCE | Admitting: Physical Therapy

## 2016-12-09 ENCOUNTER — Encounter: Payer: Self-pay | Admitting: Physical Therapy

## 2016-12-09 DIAGNOSIS — R269 Unspecified abnormalities of gait and mobility: Secondary | ICD-10-CM

## 2016-12-09 DIAGNOSIS — M25562 Pain in left knee: Secondary | ICD-10-CM

## 2016-12-09 DIAGNOSIS — M6281 Muscle weakness (generalized): Secondary | ICD-10-CM

## 2016-12-09 DIAGNOSIS — M25662 Stiffness of left knee, not elsewhere classified: Secondary | ICD-10-CM

## 2016-12-09 DIAGNOSIS — R600 Localized edema: Secondary | ICD-10-CM

## 2016-12-09 NOTE — Therapy (Signed)
[facility] [address] [place], [place], [address] Phone: [phone]   Fax:  [phone]  Physical Therapy Treatment  Patient Details  Name: [name] MRN: [id number] Date of Birth: 03-04-1961 Referring Provider: Dr. Alvan Dame  Encounter Date: 12/09/2016      PT End of Session - 12/09/16 1537    Visit Number 21   Date for PT Re-Evaluation 01/20/17   Authorization Type W/C Sharol Siler   Authorization Time Period 8 more visits approved for total 26   Authorization - Visit Number 21   Authorization - Number of Visits 26   PT Start Time 1520   PT Stop Time 1633   PT Time Calculation (min) 73 min   Activity Tolerance Patient tolerated treatment well   Behavior During Therapy WFL for tasks assessed/performed      Past Medical History:  Diagnosis Date  . Acute meniscal tear of left knee   . Complication of anesthesia 5/10   severe sore throat   . Numbness    left knee  . OA (osteoarthritis) of knee    oa    Past Surgical History:  Procedure Laterality Date  . CESAREAN SECTION     x 1  . CHONDROPLASTY Left 10/19/2014   Procedure:  LEFT MEDIAL PETELLA CHONDROPLASTY;  Surgeon: [name], MD;  Location: [facility];  Service: Orthopedics;  Laterality: Left;  . DILATION AND CURETTAGE OF UTERUS  1996  . HYSTEROSCOPY W/D&C  05-11-2000  . KNEE ARTHROSCOPY Left 10/19/2014   Procedure: LEFT ARTHROSCOPY KNEE WITH DEBRIEDMENT;  Surgeon: [name], MD;  Location: [facility];  Service: Orthopedics;  Laterality: Left;  . KNEE ARTHROSCOPY WITH LATERAL MENISECTOMY Left 10/19/2014   Procedure: KNEE ARTHROSCOPY WITH LATERAL MENISECTOMY;  Surgeon: [name], MD;  Location: [facility];  Service: Orthopedics;  Laterality: Left;  . ROBOTIC ASSISTED TOTAL HYSTERECTOMY  02/26/2009   partial  . TOTAL KNEE ARTHROPLASTY Right 05-26-2010  . TOTAL KNEE ARTHROPLASTY Left  10/13/2016   Procedure: LEFT TOTAL KNEE ARTHROPLASTY;  Surgeon: Paralee Cancel, MD;  Location: [facility];  Service: Orthopedics;  Laterality: Left;  . TUBAL LIGATION      There were no vitals filed for this visit.      Subjective Assessment - 12/09/16 1550    Subjective Stiff, sore, and itchy.  Still working 4 hour days. Reprots she had a tough day.   Pertinent History right TKR 7 years ago Dr. Noemi Chapel   Limitations Walking;House hold activities;Standing   Patient Stated Goals go back to work normal 8 hours   Currently in Pain? --  stiff, itchy, sore            [facility] PT Assessment - 12/09/16 0001      Functional Tests   Functional tests Squat     Squat   Comments Continues to have pain and slight anterior weight shift, able to perform 1/4 squat     AROM   Left Knee Extension 1   Left Knee Flexion 126     Strength   Left Knee Flexion 4+/5   Left Knee Extension 5/5                     [facility] Adult PT Treatment/Exercise - 12/09/16 0001      Knee/Hip Exercises: Stretches   Gastroc Stretch Left;3 reps;20 seconds     Knee/Hip Exercises: Machines for Strengthening   Total  Gym Leg Press Seat 6, Bil 80# 3x10, LTLE 40# 2x10     Knee/Hip Exercises: Standing   Forward Step Up Left;2 sets;10 reps  On BOSU 2x10   Walking with Sports Cord 25# 8x each direction      Vasopneumatic   Number Minutes Vasopneumatic  15 minutes   Vasopnuematic Location  Knee   Vasopneumatic Pressure High   Vasopneumatic Temperature  3 flakes                  PT Short Term Goals - 11/16/16 1026      PT SHORT TERM GOAL #1   Title Pt will be independent with initial HEP   Time 4   Period Weeks   Status Achieved           PT Long Term Goals - 12/09/16 1537      PT LONG TERM GOAL #1   Title Pt will be independent  with advanced HEP  12/11/16    Time 8   Period Weeks   Status On-going     PT LONG TERM GOAL #3   Title Pt will increase AROM left kneeflexion from 5 - 120  degrees for improved mobility in order to ride in car/perform household chores   Baseline 2-126   Time 8   Period Weeks   Status Achieved     PT LONG TERM GOAL #4   Title FOTO will improve from 82% limitation to at least 52% for improved functional mobility at home and work   Baseline 64% from 82% limitation   Time 8   Period Weeks   Status On-going     PT LONG TERM GOAL #6   Title Circumferential measurements of right and left within 3 cm indicating decreased edema needed for faster healing and return to work.     Time 8   Period Weeks   Status On-going               Plan - 12/09/16 1549    Clinical Impression Statement Patient continues to have some functional deficits and pain with squatting.  She has weakness in Left knee extension 4+/5 and increased pain. She lacks ROM in extension and continues to have swelling.  Patient would benefit from skilled PT to address functional impairments, for continues strengthening, managing pain and swelling as she works back to her normal full time schedule.    Rehab Potential Good   Clinical Impairments Affecting Rehab Potential Right TKR 7 years ago   PT Frequency 2x / week   PT Duration 6 weeks   PT Treatment/Interventions ADLs/Self Care Home Management;Electrical Stimulation;Cryotherapy;Therapeutic exercise;Neuromuscular re-education;Patient/family education;Manual techniques;Taping   PT Next Visit Plan Knee exercises in standing, squats, progress on eliptical, game ready post work.    Consulted and Agree with Plan of Care Patient      Patient will benefit from skilled therapeutic intervention in order to improve the following deficits and impairments:  Abnormal gait, Decreased range of motion, Decreased strength, Increased edema, Difficulty walking, Pain  Visit Diagnosis: Acute pain of left knee - Plan: PT plan of care cert/re-cert  Stiffness of left knee, not elsewhere classified - Plan: PT plan of care cert/re-cert  Muscle  weakness (generalized) - Plan: PT plan of care cert/re-cert  Localized edema - Plan: PT plan of care cert/re-cert  Left knee pain, unspecified chronicity - Plan: PT plan of care cert/re-cert  Abnormal gait - Plan: PT plan of care cert/re-cert     Problem  List Patient Active Problem List   Diagnosis Date Noted  . Obese 10/14/2016  . S/P left TKA 10/13/2016    Zannie Cove, PT 12/09/2016, 5:36 PM  [facility] [address], [place], [place], [address] Phone: [phone]   Fax:  [phone]  Name: MICHE BRIERE MRN: [id number] Date of Birth: 1961-09-01

## 2016-12-13 ENCOUNTER — Emergency Department (HOSPITAL_COMMUNITY)
Admission: EM | Admit: 2016-12-13 | Discharge: 2016-12-13 | Disposition: A | Discharge status: Home / Self Care | Payer: 59 | Attending: Emergency Medicine | Admitting: Emergency Medicine

## 2016-12-13 ENCOUNTER — Emergency Department (HOSPITAL_COMMUNITY): Payer: 59

## 2016-12-13 ENCOUNTER — Encounter (HOSPITAL_COMMUNITY): Payer: Self-pay

## 2016-12-13 DIAGNOSIS — Z96653 Presence of artificial knee joint, bilateral: Secondary | ICD-10-CM | POA: Insufficient documentation

## 2016-12-13 DIAGNOSIS — R109 Unspecified abdominal pain: Secondary | ICD-10-CM | POA: Diagnosis not present

## 2016-12-13 DIAGNOSIS — R1032 Left lower quadrant pain: Secondary | ICD-10-CM | POA: Insufficient documentation

## 2016-12-13 DIAGNOSIS — R112 Nausea with vomiting, unspecified: Secondary | ICD-10-CM | POA: Insufficient documentation

## 2016-12-13 DIAGNOSIS — F17210 Nicotine dependence, cigarettes, uncomplicated: Secondary | ICD-10-CM | POA: Insufficient documentation

## 2016-12-13 LAB — URINALYSIS, ROUTINE W REFLEX MICROSCOPIC
Bacteria, UA: NONE SEEN
Bilirubin Urine: NEGATIVE
Glucose, UA: NEGATIVE mg/dL
Hgb urine dipstick: NEGATIVE
KETONES UR: 20 mg/dL — AB
Leukocytes, UA: NEGATIVE
Nitrite: NEGATIVE
Protein, ur: 100 mg/dL — AB
Specific Gravity, Urine: 1.024 (ref 1.005–1.030)
pH: 6 (ref 5.0–8.0)

## 2016-12-13 LAB — CBC
HCT: 39.5 % (ref 36.0–46.0)
HEMOGLOBIN: 13.8 g/dL (ref 12.0–15.0)
MCH: 32.6 pg (ref 26.0–34.0)
MCHC: 34.9 g/dL (ref 30.0–36.0)
MCV: 93.4 fL (ref 78.0–100.0)
PLATELETS: 272 10*3/uL (ref 150–400)
RBC: 4.23 MIL/uL (ref 3.87–5.11)
RDW: 12.6 % (ref 11.5–15.5)
WBC: 9.2 10*3/uL (ref 4.0–10.5)

## 2016-12-13 LAB — COMPREHENSIVE METABOLIC PANEL
ALBUMIN: 4.5 g/dL (ref 3.5–5.0)
ALT: 14 U/L (ref 14–54)
AST: 18 U/L (ref 15–41)
Alkaline Phosphatase: 94 U/L (ref 38–126)
Anion gap: 10 (ref 5–15)
BUN: 11 mg/dL (ref 6–20)
CHLORIDE: 101 mmol/L (ref 101–111)
CO2: 24 mmol/L (ref 22–32)
CREATININE: 0.66 mg/dL (ref 0.44–1.00)
Calcium: 10.3 mg/dL (ref 8.9–10.3)
GFR calc non Af Amer: >60 mL/min (ref >60)
GLUCOSE: 102 mg/dL — AB (ref 65–99)
Potassium: 4 mmol/L (ref 3.5–5.1)
SODIUM: 135 mmol/L (ref 135–145)
Total Bilirubin: 0.9 mg/dL (ref 0.3–1.2)
Total Protein: 8.3 g/dL — ABNORMAL HIGH (ref 6.5–8.1)

## 2016-12-13 LAB — LIPASE, BLOOD: Lipase: 27 U/L (ref 11–51)

## 2016-12-13 MED ORDER — IOPAMIDOL (ISOVUE-300) INJECTION 61%
100.0000 mL | Freq: Once | INTRAVENOUS | Status: AC | PRN
  Administered 2016-12-13: 100 mL via INTRAVENOUS

## 2016-12-13 MED ORDER — IOPAMIDOL (ISOVUE-300) INJECTION 61%
INTRAVENOUS | Status: AC
  Administered 2016-12-13: 12:00:00
  Filled 2016-12-13: qty 100

## 2016-12-13 MED ORDER — ONDANSETRON 4 MG PO TBDP
4.0000 mg | ORAL_TABLET | Freq: Three times a day (TID) | ORAL | 0 refills | Status: DC | PRN
Start: 2016-12-13 — End: 2016-12-15

## 2016-12-13 MED ORDER — SODIUM CHLORIDE 0.9 % IV BOLUS (SEPSIS)
1000.0000 mL | Freq: Once | INTRAVENOUS | Status: AC
  Administered 2016-12-13: 1000 mL via INTRAVENOUS

## 2016-12-13 MED ORDER — OXYCODONE-ACETAMINOPHEN 5-325 MG PO TABS
1.0000 | ORAL_TABLET | ORAL | Status: DC | PRN
  Administered 2016-12-13: 1 via ORAL
  Filled 2016-12-13: qty 1

## 2016-12-13 MED ORDER — ONDANSETRON 4 MG PO TBDP
4.0000 mg | ORAL_TABLET | Freq: Once | ORAL | Status: AC | PRN
  Administered 2016-12-13: 4 mg via ORAL
  Filled 2016-12-13: qty 1

## 2016-12-13 MED ORDER — FENTANYL CITRATE (PF) 100 MCG/2ML IJ SOLN
50.0000 ug | Freq: Once | INTRAMUSCULAR | Status: AC
Start: 2016-12-13 — End: 2016-12-13
  Administered 2016-12-13: 50 ug via INTRAVENOUS
  Filled 2016-12-13: qty 2

## 2016-12-13 NOTE — ED Notes (Signed)
Bed: WA05 Expected date:  Expected time:  Means of arrival:  Comments: 

## 2016-12-13 NOTE — ED Notes (Signed)
Patient transported to CT 

## 2016-12-13 NOTE — ED Provider Notes (Signed)
[facility] DEPT Provider Note   CSN: [id number] Arrival date & time: 12/13/16  [id number]     History   Chief Complaint Chief Complaint  Patient presents with  . Abdominal Pain  . Emesis    HPI [name] is a 56 y.o. female.  The history is provided by the patient.  Abdominal Pain   This is a new problem. The current episode started 12 to 24 hours ago. The problem occurs constantly. The problem has been gradually worsening. The pain is associated with eating (at sausage for BF yesterday and started to vomit approx 1 hour after. has not eaten since). The pain is located in the suprapubic region and LLQ. The pain is moderate. Associated symptoms include nausea and vomiting. Pertinent negatives include fever, diarrhea and dysuria. The symptoms are aggravated by certain positions and palpation. Nothing relieves the symptoms.  Emesis   The current episode started 12 to 24 hours ago. The problem occurs 5 to 10 times per day. The problem has not changed since onset.The emesis has an appearance of stomach contents. There has been no fever. Associated symptoms include abdominal pain. Pertinent negatives include no diarrhea and no fever. Risk factors include suspect food intake.    Past Medical History:  Diagnosis Date  . Acute meniscal tear of left knee   . Complication of anesthesia 5/10   severe sore throat   . Numbness    left knee  . OA (osteoarthritis) of knee    oa    Patient Active Problem List   Diagnosis Date Noted  . Obese 10/14/2016  . S/P left TKA 10/13/2016    Past Surgical History:  Procedure Laterality Date  . CESAREAN SECTION     x 1  . CHONDROPLASTY Left 10/19/2014   Procedure:  LEFT MEDIAL PETELLA CHONDROPLASTY;  Surgeon: [name], MD;  Location: [facility];  Service: Orthopedics;  Laterality: Left;  . DILATION AND CURETTAGE OF UTERUS  1996  . HYSTEROSCOPY W/D&C  05-11-2000  . KNEE ARTHROSCOPY Left 10/19/2014   Procedure: LEFT ARTHROSCOPY  KNEE WITH DEBRIEDMENT;  Surgeon: [name], MD;  Location: [facility];  Service: Orthopedics;  Laterality: Left;  . KNEE ARTHROSCOPY WITH LATERAL MENISECTOMY Left 10/19/2014   Procedure: KNEE ARTHROSCOPY WITH LATERAL MENISECTOMY;  Surgeon: [name], MD;  Location: [facility];  Service: Orthopedics;  Laterality: Left;  . ROBOTIC ASSISTED TOTAL HYSTERECTOMY  02/26/2009   partial  . TOTAL KNEE ARTHROPLASTY Right 05-26-2010  . TOTAL KNEE ARTHROPLASTY Left 10/13/2016   Procedure: LEFT TOTAL KNEE ARTHROPLASTY;  Surgeon: Paralee Cancel, MD;  Location: [facility];  Service: Orthopedics;  Laterality: Left;  . TUBAL LIGATION      OB History    No data available       Home Medications    Prior to Admission medications   Medication Sig Start Date End Date Taking? Authorizing Provider  bismuth subsalicylate (PEPTO BISMOL) 262 MG/15ML suspension Take 30 mLs by mouth every 4 (four) hours as needed for indigestion.   Yes Historical Provider, MD  HYDROcodone-acetaminophen (NORCO) 7.5-325 MG tablet Take 1-2 tablets by mouth every 4 (four) hours as needed for moderate pain. 10/14/16  Yes Danae Orleans, PA-C  celecoxib (CELEBREX) 200 MG capsule Take 1 capsule (200 mg total) by mouth every 12 (twelve) hours. Patient not taking: Reported on 12/13/2016 10/14/16   Danae Orleans, PA-C  docusate sodium (COLACE) 100 MG capsule Take 1 capsule (100 mg total) by  mouth 2 (two) times daily. Patient not taking: Reported on 12/13/2016 10/14/16   Danae Orleans, PA-C  ferrous sulfate 325 (65 FE) MG tablet Take 1 tablet (325 mg total) by mouth 3 (three) times daily after meals. Patient not taking: Reported on 12/13/2016 10/14/16   Danae Orleans, PA-C  methocarbamol (ROBAXIN) 500 MG tablet Take 1 tablet (500 mg total) by mouth every 6 (six) hours as needed for muscle spasms. Patient not taking: Reported on 12/13/2016 10/14/16   Danae Orleans, PA-C  ondansetron (ZOFRAN ODT) 4 MG disintegrating tablet  Take 1 tablet (4 mg total) by mouth every 8 (eight) hours as needed for nausea or vomiting. 12/13/16   Fatima Blank, MD  polyethylene glycol [facility] / GLYCOLAX) packet Take 17 g by mouth 2 (two) times daily. Patient not taking: Reported on 12/13/2016 10/14/16   Danae Orleans, PA-C    Family History History reviewed. No pertinent family history.  Social History Social History  Substance Use Topics  . Smoking status: Current Every Day Smoker    Packs/day: 0.50    Years: 20.00    Types: Cigarettes  . Smokeless tobacco: Never Used  . Alcohol use 1.2 - 1.8 oz/week    2 - 3 Cans of beer per week     Comment: social use     Allergies   Tramadol   Review of Systems Review of Systems  Constitutional: Negative for fever.  Gastrointestinal: Positive for abdominal pain, nausea and vomiting. Negative for diarrhea.  Genitourinary: Negative for dysuria.  Ten systems are reviewed and are negative for acute change except as noted in the HPI    Physical Exam Updated Vital Signs BP (!) 158/105 (BP Location: Right Arm)   Pulse 70   Temp 97.6 F (36.4 C) (Oral)   Resp 18   SpO2 100%   Physical Exam  Constitutional: She is oriented to person, place, and time. She appears well-developed and well-nourished. No distress.  HENT:  Head: Normocephalic and atraumatic.  Nose: Nose normal.  Eyes: Conjunctivae and EOM are normal. Pupils are equal, round, and reactive to light. Right eye exhibits no discharge. Left eye exhibits no discharge. No scleral icterus.  Neck: Normal range of motion. Neck supple.  Cardiovascular: Normal rate and regular rhythm.  Exam reveals no gallop and no friction rub.   No murmur heard. Pulmonary/Chest: Effort normal and breath sounds normal. No stridor. No respiratory distress. She has no rales.  Abdominal: Soft. She exhibits no distension. There is tenderness in the suprapubic area and left lower quadrant. There is no rigidity, no rebound, no guarding and no  CVA tenderness.  Musculoskeletal: She exhibits no edema or tenderness.  Neurological: She is alert and oriented to person, place, and time.  Skin: Skin is warm and dry. No rash noted. She is not diaphoretic. No erythema.  Psychiatric: She has a normal mood and affect.  Vitals reviewed.    ED Treatments / Results  Labs (all labs ordered are listed, but only abnormal results are displayed) Labs Reviewed  COMPREHENSIVE METABOLIC PANEL - Abnormal; Notable for the following:       Result Value   Glucose, Bld 102 (*)    Total Protein 8.3 (*)    All other components within normal limits  URINALYSIS, ROUTINE W REFLEX MICROSCOPIC - Abnormal; Notable for the following:    APPearance HAZY (*)    Ketones, ur 20 (*)    Protein, ur 100 (*)    Squamous Epithelial / LPF 0-5 (*)  All other components within normal limits  LIPASE, BLOOD  CBC    EKG  EKG Interpretation None       Radiology Ct Abdomen Pelvis W Contrast  Result Date: 12/13/2016 CLINICAL DATA:  Abdominal pain. EXAM: CT ABDOMEN AND PELVIS WITH CONTRAST TECHNIQUE: Multidetector CT imaging of the abdomen and pelvis was performed using the standard protocol following bolus administration of intravenous contrast. CONTRAST:  164mL ISOVUE-300 IOPAMIDOL (ISOVUE-300) INJECTION 61% COMPARISON:  Lumbar spine 09/12/2007 FINDINGS: Lower chest: No acute abnormality. Hepatobiliary: No focal liver abnormality is seen. No gallstones, gallbladder wall thickening, or biliary dilatation. Pancreas: Unremarkable. No pancreatic ductal dilatation or surrounding inflammatory changes. Spleen: Normal in size without focal abnormality. Adrenals/Urinary Tract: Adrenal glands are unremarkable. Kidneys are normal, without renal calculi, focal lesion, or hydronephrosis. Bladder is unremarkable. Stomach/Bowel: Mild sigmoid diverticulosis. No acute diverticulitis. The appendix is well seen and has a normal appearance. Low to average stool burden. Vascular/Lymphatic:  There is mild atherosclerotic calcification of the abdominal aorta. Reproductive: The uterus is absent.  No adnexal mass. Other: No abdominal wall hernia or abnormality. No abdominopelvic ascites. Musculoskeletal: There is 5 mm anterolisthesis of L5 on S1 associated with disc height loss. IMPRESSION: 1.  No evidence for acute  abnormality. 2. Normal appendix. 3. Grade 1 anterolisthesis L5 on S1. 4.  Aortic atherosclerosis. Electronically Signed   By: Nolon Nations M.D.   On: 12/13/2016 12:22    Procedures Procedures (including critical care time)  Medications Ordered in ED Medications  ondansetron (ZOFRAN-ODT) disintegrating tablet 4 mg (4 mg Oral Given 12/13/16 0954)  sodium chloride 0.9 % bolus 1,000 mL (0 mLs Intravenous Stopped 12/13/16 1200)  fentaNYL (SUBLIMAZE) injection 50 mcg (50 mcg Intravenous Given 12/13/16 1050)  iopamidol (ISOVUE-300) 61 % injection (  Contrast Given 12/13/16 1144)  iopamidol (ISOVUE-300) 61 % injection 100 mL (100 mLs Intravenous Contrast Given 12/13/16 1118)     Initial Impression / Assessment and Plan / ED Course  I have reviewed the triage vital signs and the nursing notes.  Pertinent labs & imaging results that were available during my care of the patient were reviewed by me and considered in my medical decision making (see chart for details).     Is grossly reassuring. CT without evidence of severe intra-abdominal inflammation or infectious processes. Likely secondary to food poisoning. Patient provided with IV fluids and nausea medicine. Able to tolerate by mouth intake. The patient is safe for discharge with strict return precautions.   Final Clinical Impressions(s) / ED Diagnoses   Final diagnoses:  Left lower quadrant pain  Non-intractable vomiting with nausea, unspecified vomiting type   Disposition: Discharge  Condition: Good  I have discussed the results, Dx and Tx plan with the patient who expressed understanding and agree(s) with the plan.  Discharge instructions discussed at great length. The patient was given strict return precautions who verbalized understanding of the instructions. No further questions at time of discharge.    New Prescriptions   ONDANSETRON (ZOFRAN ODT) 4 MG DISINTEGRATING TABLET    Take 1 tablet (4 mg total) by mouth every 8 (eight) hours as needed for nausea or vomiting.    Follow Up: Posey Boyer, MD [place] [id number] [phone]  Schedule an appointment as soon as possible for a visit  in 3-5 days, If symptoms do not improve or  worsen      Fatima Blank, MD 12/13/16 1343

## 2016-12-13 NOTE — ED Notes (Signed)
Bed: WA04 Expected date:  Expected time:  Means of arrival:  Comments: 

## 2016-12-13 NOTE — ED Triage Notes (Signed)
Pt with abdominal pain, nv since yesterday.  No diarrhea.  No fever.  Unable to eat since yesterday.

## 2016-12-14 ENCOUNTER — Encounter: Payer: Self-pay | Admitting: Physical Therapy

## 2016-12-15 ENCOUNTER — Inpatient Hospital Stay (HOSPITAL_COMMUNITY)
Admission: AD | Admit: 2016-12-15 | Discharge: 2016-12-15 | Disposition: A | Discharge status: Home / Self Care | Payer: 59 | Source: Ambulatory Visit | Attending: Obstetrics & Gynecology | Admitting: Obstetrics & Gynecology

## 2016-12-15 ENCOUNTER — Encounter (HOSPITAL_COMMUNITY): Payer: Self-pay | Admitting: *Deleted

## 2016-12-15 DIAGNOSIS — K5901 Slow transit constipation: Secondary | ICD-10-CM | POA: Diagnosis not present

## 2016-12-15 DIAGNOSIS — R1030 Lower abdominal pain, unspecified: Secondary | ICD-10-CM | POA: Insufficient documentation

## 2016-12-15 DIAGNOSIS — Z9851 Tubal ligation status: Secondary | ICD-10-CM | POA: Insufficient documentation

## 2016-12-15 DIAGNOSIS — R109 Unspecified abdominal pain: Secondary | ICD-10-CM | POA: Diagnosis present

## 2016-12-15 DIAGNOSIS — F17210 Nicotine dependence, cigarettes, uncomplicated: Secondary | ICD-10-CM | POA: Diagnosis not present

## 2016-12-15 HISTORY — DX: Benign neoplasm of connective and other soft tissue, unspecified: D21.9

## 2016-12-15 LAB — CBC
HEMATOCRIT: 39.7 % (ref 36.0–46.0)
Hemoglobin: 13.5 g/dL (ref 12.0–15.0)
MCH: 32.4 pg (ref 26.0–34.0)
MCHC: 34 g/dL (ref 30.0–36.0)
MCV: 95.2 fL (ref 78.0–100.0)
PLATELETS: 251 10*3/uL (ref 150–400)
RBC: 4.17 MIL/uL (ref 3.87–5.11)
RDW: 11.7 % (ref 11.5–15.5)
WBC: 6.2 10*3/uL (ref 4.0–10.5)

## 2016-12-15 LAB — URINALYSIS, ROUTINE W REFLEX MICROSCOPIC
BILIRUBIN URINE: NEGATIVE
GLUCOSE, UA: NEGATIVE mg/dL
HGB URINE DIPSTICK: NEGATIVE
Ketones, ur: 5 mg/dL — AB
Leukocytes, UA: NEGATIVE
Nitrite: NEGATIVE
PH: 7 (ref 5.0–8.0)
Protein, ur: NEGATIVE mg/dL
SPECIFIC GRAVITY, URINE: 1.012 (ref 1.005–1.030)

## 2016-12-15 MED ORDER — IBUPROFEN 600 MG PO TABS
600.0000 mg | ORAL_TABLET | Freq: Four times a day (QID) | ORAL | Status: DC | PRN
  Administered 2016-12-15: 600 mg via ORAL
  Filled 2016-12-15: qty 1

## 2016-12-15 MED ORDER — POLYETHYLENE GLYCOL 3350 17 G PO PACK: 17.0000 g | PACK | Freq: Two times a day (BID) | ORAL | 0 refills | Status: DC

## 2016-12-15 MED ORDER — IBUPROFEN 600 MG PO TABS: 600.0000 mg | ORAL_TABLET | Freq: Four times a day (QID) | ORAL | 0 refills | Status: DC | PRN

## 2016-12-15 NOTE — Discharge Instructions (Signed)
Abdominal Pain, Adult Many things can cause belly (abdominal) pain. Most times, belly pain is not dangerous. Many cases of belly pain can be watched and treated at home. Sometimes belly pain is serious, though. Your doctor will try to find the cause of your belly pain. Follow these instructions at home:  Take over-the-counter and prescription medicines only as told by your doctor. Do not take medicines that help you poop (laxatives) unless told to by your doctor.  Drink enough fluid to keep your pee (urine) clear or pale yellow.  Watch your belly pain for any changes.  Keep all follow-up visits as told by your doctor. This is important. Contact a doctor if:  Your belly pain changes or gets worse.  You are not hungry, or you lose weight without trying.  You are having trouble pooping (constipated) or have watery poop (diarrhea) for more than 2-3 days.  You have pain when you pee or poop.  Your belly pain wakes you up at night.  Your pain gets worse with meals, after eating, or with certain foods.  You are throwing up and cannot keep anything down.  You have a fever. Get help right away if:  Your pain does not go away as soon as your doctor says it should.  You cannot stop throwing up.  Your pain is only in areas of your belly, such as the right side or the left lower part of the belly.  You have bloody or black poop, or poop that looks like tar.  You have very bad pain, cramping, or bloating in your belly.  You have signs of not having enough fluid or water in your body (dehydration), such as:  Dark pee, very little pee, or no pee.  Cracked lips.  Dry mouth.  Sunken eyes.  Sleepiness.  Weakness. This information is not intended to replace advice given to you by your health care provider. Make sure you discuss any questions you have with your health care provider. Document Released: 03/16/2008 Document Revised: 04/17/2016 Document Reviewed: 03/11/2016 Elsevier  Interactive Patient Education  2017 [employer] Constipation, Adult Constipation is when a person:  Poops (has a bowel movement) fewer times in a week than normal.  Has a hard time pooping.  Has poop that is dry, hard, or bigger than normal. Follow these instructions at home: Eating and drinking    Eat foods that have a lot of fiber, such as:  Fresh fruits and vegetables.  Whole grains.  Beans.  Eat less of foods that are high in fat, low in fiber, or overly processed, such as:  [place] fries.  Hamburgers.  Cookies.  Candy.  Soda.  Drink enough fluid to keep your pee (urine) clear or pale yellow. General instructions   Exercise regularly or as told by your doctor.  Go to the restroom when you feel like you need to poop. Do not hold it in.  Take over-the-counter and prescription medicines only as told by your doctor. These include any fiber supplements.  Do pelvic floor retraining exercises, such as:  Doing deep breathing while relaxing your lower belly (abdomen).  Relaxing your pelvic floor while pooping.  Watch your condition for any changes.  Keep all follow-up visits as told by your doctor. This is important. Contact a doctor if:  You have pain that gets worse.  You have a fever.  You have not pooped for 4 days.  You throw up (vomit).  You are not hungry.  You lose weight.  You are bleeding from the anus.  You have thin, pencil-like poop (stool). Get help right away if:  You have a fever, and your symptoms suddenly get worse.  You leak poop or have blood in your poop.  Your belly feels hard or bigger than normal (is bloated).  You have very bad belly pain.  You feel dizzy or you faint. This information is not intended to replace advice given to you by your health care provider. Make sure you discuss any questions you have with your health care provider. Document Released: 03/16/2008 Document Revised: 04/17/2016 Document Reviewed:  03/18/2016 Elsevier Interactive Patient Education  2017 [employer].

## 2016-12-15 NOTE — MAU Provider Note (Signed)
History     CSN: [id number]  Arrival date and time: 12/15/16 [id number]   First Provider Initiated Contact with Patient 12/15/16 0818      Chief Complaint  Patient presents with  . Abdominal Pain   56 yo nonpregnant female here with abdominal pain. Pain started 3 days ago shortly after she ate "something that didn't agree with me". Describes pain as constant and sharp in bilateral lower abdomen. Associated sx are N/V yesterday. No diarrhea. Last BM 3 days ago, states "I wish I could have a bowel movement". No fevers or chills. She is tolerating po fluids this am. No nausea currently. Review of chart shows she was seen at [facility] 2 days ago for abd pain, N/V. CBC, CMP, Lipase, and CT were all normal. She was given Zofran and discharged.    Past Medical History:  Diagnosis Date  . Acute meniscal tear of left knee   . Complication of anesthesia 5/10   severe sore throat   . Fibroid   . Numbness    left knee  . OA (osteoarthritis) of knee    oa    Past Surgical History:  Procedure Laterality Date  . CESAREAN SECTION     x 1  . CHONDROPLASTY Left 10/19/2014   Procedure:  LEFT MEDIAL PETELLA CHONDROPLASTY;  Surgeon: [name], MD;  Location: [facility];  Service: Orthopedics;  Laterality: Left;  . DILATION AND CURETTAGE OF UTERUS  1996  . HYSTEROSCOPY W/D&C  05-11-2000  . KNEE ARTHROSCOPY Left 10/19/2014   Procedure: LEFT ARTHROSCOPY KNEE WITH DEBRIEDMENT;  Surgeon: [name], MD;  Location: [facility];  Service: Orthopedics;  Laterality: Left;  . KNEE ARTHROSCOPY WITH LATERAL MENISECTOMY Left 10/19/2014   Procedure: KNEE ARTHROSCOPY WITH LATERAL MENISECTOMY;  Surgeon: [name], MD;  Location: [facility];  Service: Orthopedics;  Laterality: Left;  . ROBOTIC ASSISTED TOTAL HYSTERECTOMY  02/26/2009   partial  . TOTAL KNEE ARTHROPLASTY Right 05-26-2010  . TOTAL KNEE ARTHROPLASTY Left 10/13/2016   Procedure: LEFT TOTAL KNEE  ARTHROPLASTY;  Surgeon: Paralee Cancel, MD;  Location: [facility];  Service: Orthopedics;  Laterality: Left;  . TUBAL LIGATION      Family History  Problem Relation Age of Onset  . Alcohol abuse Mother   . Cancer Father     Social History  Substance Use Topics  . Smoking status: Current Every Day Smoker    Packs/day: 0.50    Years: 20.00    Types: Cigarettes  . Smokeless tobacco: Never Used  . Alcohol use 1.2 - 1.8 oz/week    2 - 3 Cans of beer per week    Allergies:  Allergies  Allergen Reactions  . Tramadol Other (See Comments)    " dont feel right"    Prescriptions Prior to Admission  Medication Sig Dispense Refill Last Dose  . bismuth subsalicylate (PEPTO BISMOL) 262 MG/15ML suspension Take 30 mLs by mouth every 4 (four) hours as needed for indigestion.   12/12/2016 at Unknown time  . celecoxib (CELEBREX) 200 MG capsule Take 1 capsule (200 mg total) by mouth every 12 (twelve) hours. (Patient not taking: Reported on 12/13/2016) 60 capsule 0 Not Taking at Unknown time  . docusate sodium (COLACE) 100 MG capsule Take 1 capsule (100 mg total) by mouth 2 (two) times daily. (Patient not taking: Reported on 12/13/2016) 10 capsule 0 Not Taking at Unknown time  . ferrous sulfate 325 (65 FE) MG tablet Take 1 tablet (  325 mg total) by mouth 3 (three) times daily after meals. (Patient not taking: Reported on 12/13/2016)  3 Not Taking at Unknown time  . HYDROcodone-acetaminophen (NORCO) 7.5-325 MG tablet Take 1-2 tablets by mouth every 4 (four) hours as needed for moderate pain. 60 tablet 0 Past Week at Unknown time  . methocarbamol (ROBAXIN) 500 MG tablet Take 1 tablet (500 mg total) by mouth every 6 (six) hours as needed for muscle spasms. (Patient not taking: Reported on 12/13/2016) 40 tablet 0 Not Taking at Unknown time  . ondansetron (ZOFRAN ODT) 4 MG disintegrating tablet Take 1 tablet (4 mg total) by mouth every 8 (eight) hours as needed for nausea or vomiting. 20 tablet 0   . polyethylene glycol  (MIRALAX / GLYCOLAX) packet Take 17 g by mouth 2 (two) times daily. (Patient not taking: Reported on 12/13/2016) 14 each 0 Not Taking at Unknown time    Review of Systems  Constitutional: Negative for chills and fever.  Gastrointestinal: Positive for abdominal pain, constipation, nausea and vomiting. Negative for diarrhea.   Physical Exam   Blood pressure (!) 179/108, pulse 60, temperature 97.9 F (36.6 C), temperature source Oral, resp. rate 18, weight 74.6 kg (164 lb 8 oz), SpO2 100 %.  Physical Exam  Nursing note and vitals reviewed. Constitutional: She is oriented to person, place, and time. She appears well-developed and well-nourished. No distress.  HENT:  Head: Normocephalic and atraumatic.  Neck: Normal range of motion.  Cardiovascular: Normal rate.   Respiratory: Effort normal. No respiratory distress.  GI: Soft. She exhibits no distension and no mass. There is tenderness (diffuse mid to lower quadrants). There is no rebound and no guarding.  Musculoskeletal: Normal range of motion.  Neurological: She is alert and oriented to person, place, and time.  Skin: Skin is warm and dry.  Psychiatric: She has a normal mood and affect.   Results for orders placed or performed during the hospital encounter of 12/15/16 (from the past 24 hour(s))  Urinalysis, Routine w reflex microscopic     Status: Abnormal   Collection Time: 12/15/16  7:55 AM  Result Value Ref Range   Color, Urine YELLOW YELLOW   APPearance CLEAR CLEAR   Specific Gravity, Urine 1.012 1.005 - 1.030   pH 7.0 5.0 - 8.0   Glucose, UA NEGATIVE NEGATIVE mg/dL   Hgb urine dipstick NEGATIVE NEGATIVE   Bilirubin Urine NEGATIVE NEGATIVE   Ketones, ur 5 (A) NEGATIVE mg/dL   Protein, ur NEGATIVE NEGATIVE mg/dL   Nitrite NEGATIVE NEGATIVE   Leukocytes, UA NEGATIVE NEGATIVE  CBC     Status: None   Collection Time: 12/15/16  8:49 AM  Result Value Ref Range   WBC 6.2 4.0 - 10.5 K/uL   RBC 4.17 3.87 - 5.11 MIL/uL    Hemoglobin 13.5 12.0 - 15.0 g/dL   HCT 39.7 36.0 - 46.0 %   MCV 95.2 78.0 - 100.0 fL   MCH 32.4 26.0 - 34.0 pg   MCHC 34.0 30.0 - 36.0 g/dL   RDW 11.7 11.5 - 15.5 %   Platelets 251 150 - 400 K/uL   MAU Course  Procedures Enema Ibuprofen  MDM Labs ordered and reviewed. BM x2 after enema. Pain improved some. No evidence of acute abdominal process. Pain likely d/t constipation caused as SE of Zofran. Stable for discharge home.   Assessment and Plan   1. Lower abdominal pain   2. Slow transit constipation    Discharge home Rx Ibuprofen Rx Miralax Increase  water intake Increase dietary fiber Follow up with PCP or return to [facility] or [place] for worsening sx  Allergies as of 12/15/2016      Reactions   Tramadol Other (See Comments)   " dont feel right"      Medication List    STOP taking these medications   celecoxib 200 MG capsule Commonly known as:  CELEBREX   ferrous sulfate 325 (65 FE) MG tablet   HYDROcodone-acetaminophen 7.5-325 MG tablet Commonly known as:  NORCO   methocarbamol 500 MG tablet Commonly known as:  ROBAXIN   ondansetron 4 MG disintegrating tablet Commonly known as:  ZOFRAN ODT     TAKE these medications   acetaminophen 500 MG tablet Commonly known as:  TYLENOL Take 500 mg by mouth every 6 (six) hours as needed for moderate pain.   docusate sodium 100 MG capsule Commonly known as:  COLACE Take 1 capsule (100 mg total) by mouth 2 (two) times daily.   ibuprofen 600 MG tablet Commonly known as:  ADVIL,MOTRIN Take 1 tablet (600 mg total) by mouth every 6 (six) hours as needed for moderate pain.   polyethylene glycol packet Commonly known as:  MIRALAX / GLYCOLAX Take 17 g by mouth 2 (two) times daily.      Julianne Handler, CNM 12/15/2016, 8:26 AM

## 2016-12-15 NOTE — MAU Note (Signed)
Urine in lab 

## 2016-12-15 NOTE — MAU Note (Signed)
Pain started on Sat.  Is a sharp pain.  At times is weak and dizzy.  Went to [facility] on Sun, had some testing, was not given anything for pain, just medication for nausea.  Tried to eat yesterday, was not able. Did keep some fluids down this morning. Has lost 6 #.  Has not had a BM since Sat.

## 2016-12-16 ENCOUNTER — Emergency Department (HOSPITAL_COMMUNITY): Payer: 59

## 2016-12-16 ENCOUNTER — Emergency Department (HOSPITAL_COMMUNITY)
Admission: EM | Admit: 2016-12-16 | Discharge: 2016-12-16 | Disposition: A | Discharge status: Home / Self Care | Payer: 59 | Attending: Emergency Medicine | Admitting: Emergency Medicine

## 2016-12-16 ENCOUNTER — Encounter: Payer: Self-pay | Admitting: Physical Therapy

## 2016-12-16 ENCOUNTER — Encounter (HOSPITAL_COMMUNITY): Payer: Self-pay

## 2016-12-16 DIAGNOSIS — R0602 Shortness of breath: Secondary | ICD-10-CM | POA: Diagnosis not present

## 2016-12-16 DIAGNOSIS — N179 Acute kidney failure, unspecified: Secondary | ICD-10-CM | POA: Diagnosis not present

## 2016-12-16 DIAGNOSIS — F17210 Nicotine dependence, cigarettes, uncomplicated: Secondary | ICD-10-CM | POA: Diagnosis not present

## 2016-12-16 DIAGNOSIS — E860 Dehydration: Secondary | ICD-10-CM | POA: Insufficient documentation

## 2016-12-16 DIAGNOSIS — R531 Weakness: Secondary | ICD-10-CM | POA: Diagnosis not present

## 2016-12-16 DIAGNOSIS — Z79899 Other long term (current) drug therapy: Secondary | ICD-10-CM | POA: Insufficient documentation

## 2016-12-16 DIAGNOSIS — R079 Chest pain, unspecified: Secondary | ICD-10-CM | POA: Diagnosis not present

## 2016-12-16 DIAGNOSIS — R5383 Other fatigue: Secondary | ICD-10-CM | POA: Diagnosis not present

## 2016-12-16 DIAGNOSIS — Z96653 Presence of artificial knee joint, bilateral: Secondary | ICD-10-CM | POA: Insufficient documentation

## 2016-12-16 LAB — CBC WITH DIFFERENTIAL/PLATELET
BASOS ABS: 0 10*3/uL (ref 0.0–0.1)
Basophils Relative: 0 %
EOS PCT: 0 %
Eosinophils Absolute: 0 10*3/uL (ref 0.0–0.7)
HEMATOCRIT: 41.7 % (ref 36.0–46.0)
HEMOGLOBIN: 14.2 g/dL (ref 12.0–15.0)
LYMPHS ABS: 1.8 10*3/uL (ref 0.7–4.0)
LYMPHS PCT: 19 %
MCH: 31.9 pg (ref 26.0–34.0)
MCHC: 34.1 g/dL (ref 30.0–36.0)
MCV: 93.7 fL (ref 78.0–100.0)
Monocytes Absolute: 0.7 10*3/uL (ref 0.1–1.0)
Monocytes Relative: 7 %
NEUTROS ABS: 7.2 10*3/uL (ref 1.7–7.7)
Neutrophils Relative %: 74 %
PLATELETS: 264 10*3/uL (ref 150–400)
RBC: 4.45 MIL/uL (ref 3.87–5.11)
RDW: 12.5 % (ref 11.5–15.5)
WBC: 9.7 10*3/uL (ref 4.0–10.5)

## 2016-12-16 LAB — COMPREHENSIVE METABOLIC PANEL
ALT: 28 U/L (ref 14–54)
ANION GAP: 8 (ref 5–15)
AST: 29 U/L (ref 15–41)
Albumin: 4.8 g/dL (ref 3.5–5.0)
Alkaline Phosphatase: 83 U/L (ref 38–126)
BILIRUBIN TOTAL: 1 mg/dL (ref 0.3–1.2)
BUN: 17 mg/dL (ref 6–20)
CHLORIDE: 101 mmol/L (ref 101–111)
CO2: 26 mmol/L (ref 22–32)
Calcium: 10 mg/dL (ref 8.9–10.3)
Creatinine, Ser: 1.71 mg/dL — ABNORMAL HIGH (ref 0.44–1.00)
GFR calc Af Amer: 38 mL/min — ABNORMAL LOW (ref >60)
GFR, EST NON AFRICAN AMERICAN: 33 mL/min — AB (ref >60)
Glucose, Bld: 84 mg/dL (ref 65–99)
POTASSIUM: 3.5 mmol/L (ref 3.5–5.1)
Sodium: 135 mmol/L (ref 135–145)
TOTAL PROTEIN: 8.2 g/dL — AB (ref 6.5–8.1)

## 2016-12-16 LAB — BRAIN NATRIURETIC PEPTIDE: B Natriuretic Peptide: 31.5 pg/mL (ref 0.0–100.0)

## 2016-12-16 LAB — TROPONIN I: Troponin I: 0.05 ng/mL (ref <0.03)

## 2016-12-16 MED ORDER — SODIUM CHLORIDE 0.9 % IV BOLUS (SEPSIS)
1000.0000 mL | Freq: Once | INTRAVENOUS | Status: AC
Start: 2016-12-16 — End: 2016-12-16
  Administered 2016-12-16: 1000 mL via INTRAVENOUS

## 2016-12-16 MED ORDER — SODIUM CHLORIDE 0.9 % IV BOLUS (SEPSIS)
1000.0000 mL | Freq: Once | INTRAVENOUS | Status: AC
  Administered 2016-12-16: 1000 mL via INTRAVENOUS

## 2016-12-16 NOTE — Discharge Instructions (Signed)
As discussed, although you have elected to go home, do not hesitate to return here for concerning changes in your condition.  Otherwise, please monitor your condition carefully, drink plenty of fluids, and be sure to follow-up with your physician.

## 2016-12-16 NOTE — ED Triage Notes (Signed)
[facility].  Given enemas and thinners.  Pt has multiple labs completed yesterday.  Pt today says she is shortness of breath and weakness.  States some tightness with breathing in chest.  No fever.  Not eating well.

## 2016-12-16 NOTE — ED Provider Notes (Signed)
[facility] DEPT Provider Note   CSN: [id number] Arrival date & time: 12/16/16  1029     History   Chief Complaint Chief Complaint  Patient presents with  . Shortness of Breath    HPI [name] is a 56 y.o. female.  HPI Patient presents with concern dyspnea, fatigue, nausea. Notably, the patient was well until about one week ago. Since that time she has been evaluated here once, at another facility once, and now returns with concerning persistent symptoms. She notes that during this illness she has had anorexia, by mouth intolerance, nausea, as well as generalized weakness, dyspnea, fatigue. Today, while attempting to return to work, she felt lightheaded with upright positioning.  No new focal pain, no headache, no confusion, no disorientation, no syncope.   Past Medical History:  Diagnosis Date  . Acute meniscal tear of left knee   . Complication of anesthesia 5/10   severe sore throat   . Fibroid   . Numbness    left knee  . OA (osteoarthritis) of knee    oa    Patient Active Problem List   Diagnosis Date Noted  . Obese 10/14/2016  . S/P left TKA 10/13/2016    Past Surgical History:  Procedure Laterality Date  . CESAREAN SECTION     x 1  . CHONDROPLASTY Left 10/19/2014   Procedure:  LEFT MEDIAL PETELLA CHONDROPLASTY;  Surgeon: [name], MD;  Location: [facility];  Service: Orthopedics;  Laterality: Left;  . DILATION AND CURETTAGE OF UTERUS  1996  . HYSTEROSCOPY W/D&C  05-11-2000  . KNEE ARTHROSCOPY Left 10/19/2014   Procedure: LEFT ARTHROSCOPY KNEE WITH DEBRIEDMENT;  Surgeon: [name], MD;  Location: [facility];  Service: Orthopedics;  Laterality: Left;  . KNEE ARTHROSCOPY WITH LATERAL MENISECTOMY Left 10/19/2014   Procedure: KNEE ARTHROSCOPY WITH LATERAL MENISECTOMY;  Surgeon: [name], MD;  Location: [facility];  Service: Orthopedics;  Laterality: Left;  . ROBOTIC ASSISTED TOTAL HYSTERECTOMY   02/26/2009   partial  . TOTAL KNEE ARTHROPLASTY Right 05-26-2010  . TOTAL KNEE ARTHROPLASTY Left 10/13/2016   Procedure: LEFT TOTAL KNEE ARTHROPLASTY;  Surgeon: Paralee Cancel, MD;  Location: [facility];  Service: Orthopedics;  Laterality: Left;  . TUBAL LIGATION      OB History    Gravida Para Term Preterm AB Living   2 2 2  0 0 0   SAB TAB Ectopic Multiple Live Births   0 0 0 0 2       Home Medications    Prior to Admission medications   Medication Sig Start Date End Date Taking? Authorizing Provider  acetaminophen (TYLENOL) 500 MG tablet Take 500 mg by mouth every 6 (six) hours as needed for moderate pain.   Yes Historical Provider, MD  docusate sodium (COLACE) 100 MG capsule Take 1 capsule (100 mg total) by mouth 2 (two) times daily. Patient not taking: Reported on 12/13/2016 10/14/16   Danae Orleans, PA-C  ibuprofen (ADVIL,MOTRIN) 600 MG tablet Take 1 tablet (600 mg total) by mouth every 6 (six) hours as needed for moderate pain. Patient not taking: Reported on 12/16/2016 12/15/16   Julianne Handler, CNM  polyethylene glycol (MIRALAX / GLYCOLAX) packet Take 17 g by mouth 2 (two) times daily. Patient not taking: Reported on 12/16/2016 12/15/16   Julianne Handler, CNM    Family History Family History  Problem Relation Age of Onset  . Alcohol abuse Mother   . Cancer Father  Social History Social History  Substance Use Topics  . Smoking status: Current Every Day Smoker    Packs/day: 0.50    Years: 20.00    Types: Cigarettes  . Smokeless tobacco: Never Used  . Alcohol use 1.2 - 1.8 oz/week    2 - 3 Cans of beer per week     Allergies   Tramadol   Review of Systems Review of Systems  Constitutional:       Per HPI, otherwise negative  HENT:       Per HPI, otherwise negative  Respiratory:       Per HPI, otherwise negative  Cardiovascular:       Per HPI, otherwise negative  Gastrointestinal: Positive for diarrhea and nausea. Negative for vomiting.       Received enema two  days ago, now w ongoing BM  Endocrine:       Negative aside from HPI  Genitourinary:       Neg aside from HPI   Musculoskeletal:       Per HPI, otherwise negative  Skin: Negative.   Neurological: Positive for weakness and light-headedness. Negative for syncope and headaches.     Physical Exam Updated Vital Signs BP 120/75 (BP Location: Left Arm)   Pulse 84   Temp 97.4 F (36.3 C) (Oral)   Resp 16   SpO2 100%   Physical Exam  Constitutional: She is oriented to person, place, and time. She appears well-developed and well-nourished. No distress.  HENT:  Head: Normocephalic and atraumatic.  Eyes: Conjunctivae and EOM are normal.  Cardiovascular: Normal rate and regular rhythm.   Pulmonary/Chest: Effort normal and breath sounds normal. No stridor. No respiratory distress.  Abdominal: She exhibits no distension. There is no tenderness. There is no guarding.  Musculoskeletal: She exhibits no edema.  Neurological: She is alert and oriented to person, place, and time. No cranial nerve deficit.  Skin: Skin is warm and dry.  Psychiatric: She has a normal mood and affect.  Nursing note and vitals reviewed.    ED Treatments / Results  Labs (all labs ordered are listed, but only abnormal results are displayed) Labs Reviewed  COMPREHENSIVE METABOLIC PANEL - Abnormal; Notable for the following:       Result Value   Creatinine, Ser 1.71 (*)    Total Protein 8.2 (*)    GFR calc non Af Amer 33 (*)    GFR calc Af Amer 38 (*)    All other components within normal limits  TROPONIN I - Abnormal; Notable for the following:    Troponin I 0.05 (*)    All other components within normal limits  CBC WITH DIFFERENTIAL/PLATELET  BRAIN NATRIURETIC PEPTIDE    EKG  EKG Interpretation None       Radiology Dg Chest 2 View  Result Date: 12/16/2016 CLINICAL DATA:  Mid chest pain, shortness of breath, weakness EXAM: CHEST  2 VIEW COMPARISON:  Chest x-ray of 05/20/2010 FINDINGS: No active  infiltrate or effusion is seen. Mediastinal and hilar contours are unremarkable. The heart is within normal limits in size. No bony abnormality is seen. IMPRESSION: No active cardiopulmonary disease. Electronically Signed   By: Ivar Drape M.D.   On: 12/16/2016 11:09    Procedures Procedures (including critical care time)  Medications Ordered in ED Medications  sodium chloride 0.9 % bolus 1,000 mL (1,000 mLs Intravenous New Bag/Given 12/16/16 1442)  sodium chloride 0.9 % bolus 1,000 mL (1,000 mLs Intravenous New Bag/Given 12/16/16 1442)  Chart review notable for evaluation here last week, one interval evaluation at our [facility] health facility. Vitals now notable for hypotension compared to last week. Initial Impression / Assessment and Plan / ED Course  I have reviewed the triage vital signs and the nursing notes.  Pertinent labs & imaging results that were available during my care of the patient were reviewed by me and considered in my medical decision making (see chart for details).  Initial labs elevated for minimally elevated troponin, symptoms elevated creatinine, double since 4 days ago. On repeat exam the patient continues to deny any chest pain, but does acknowledge ongoing fatigue. I advocated for admission given the acute kidney injury, elevated troponin, concern for dehydration. Patient is insistent upon receiving IV fluids, and if she is tolerant of oral intake, going home. Counseled her on the risks and benefits of doing so, and she has capacity to make this decision.   Patient has tolerated IV fluids, with no decompensation, blood pressure has normalized, the patient has no new complaints, patient will be discharged, per her request, with close outpatient follow-up.  Final Clinical Impressions(s) / ED Diagnoses  Fatigue Acute kidney injury    Carmin Muskrat, MD 12/16/16 [phone]

## 2016-12-21 ENCOUNTER — Ambulatory Visit: Payer: PRIVATE HEALTH INSURANCE | Admitting: Physical Therapy

## 2016-12-23 ENCOUNTER — Ambulatory Visit: Payer: PRIVATE HEALTH INSURANCE | Admitting: Physical Therapy

## 2016-12-24 ENCOUNTER — Ambulatory Visit: Payer: PRIVATE HEALTH INSURANCE | Attending: Orthopedic Surgery | Admitting: Physical Therapy

## 2016-12-28 ENCOUNTER — Ambulatory Visit: Payer: PRIVATE HEALTH INSURANCE | Attending: Orthopedic Surgery | Admitting: Physical Therapy

## 2016-12-28 ENCOUNTER — Encounter: Payer: Self-pay | Admitting: Physical Therapy

## 2016-12-28 DIAGNOSIS — M25562 Pain in left knee: Secondary | ICD-10-CM | POA: Diagnosis present

## 2016-12-28 DIAGNOSIS — M25662 Stiffness of left knee, not elsewhere classified: Secondary | ICD-10-CM | POA: Insufficient documentation

## 2016-12-28 DIAGNOSIS — R600 Localized edema: Secondary | ICD-10-CM | POA: Insufficient documentation

## 2016-12-28 DIAGNOSIS — M6281 Muscle weakness (generalized): Secondary | ICD-10-CM | POA: Insufficient documentation

## 2016-12-28 NOTE — Therapy (Addendum)
[facility] [address] [place], [place], [address] Phone: [phone]   Fax:  [phone]  Physical Therapy Treatment  Patient Details  Name: [name] MRN: [id number] Date of Birth: [birth year] Referring Provider: Dr. Alvan Dame  Encounter Date: 12/28/2016      PT End of Session - 12/28/16 1527    Visit Number 22   Date for PT Re-Evaluation 01/20/17   Authorization Type W/C Sharol Siler   Authorization Time Period 8 more visits approved for total 26   Authorization - Visit Number 31   Authorization - Number of Visits 26   PT Start Time 1526   PT Stop Time 1611   PT Time Calculation (min) 45 min   Activity Tolerance Patient tolerated treatment well;Patient limited by fatigue   Behavior During Therapy Agitated      Past Medical History:  Diagnosis Date  . Acute meniscal tear of left knee   . Complication of anesthesia 5/10   severe sore throat   . Fibroid   . Numbness    left knee  . OA (osteoarthritis) of knee    oa    Past Surgical History:  Procedure Laterality Date  . CESAREAN SECTION     x 1  . CHONDROPLASTY Left 10/19/2014   Procedure:  LEFT MEDIAL PETELLA CHONDROPLASTY;  Surgeon: [name], MD;  Location: [facility];  Service: Orthopedics;  Laterality: Left;  . DILATION AND CURETTAGE OF UTERUS  1996  . HYSTEROSCOPY W/D&C  05-11-2000  . KNEE ARTHROSCOPY Left 10/19/2014   Procedure: LEFT ARTHROSCOPY KNEE WITH DEBRIEDMENT;  Surgeon: [name], MD;  Location: [facility];  Service: Orthopedics;  Laterality: Left;  . KNEE ARTHROSCOPY WITH LATERAL MENISECTOMY Left 10/19/2014   Procedure: KNEE ARTHROSCOPY WITH LATERAL MENISECTOMY;  Surgeon: [name], MD;  Location: [facility];  Service: Orthopedics;  Laterality: Left;  . ROBOTIC ASSISTED TOTAL HYSTERECTOMY  02/26/2009   partial  . TOTAL KNEE ARTHROPLASTY Right 05-26-2010  . TOTAL KNEE  ARTHROPLASTY Left 10/13/2016   Procedure: LEFT TOTAL KNEE ARTHROPLASTY;  Surgeon: Paralee Cancel, MD;  Location: [facility];  Service: Orthopedics;  Laterality: Left;  . TUBAL LIGATION      There were no vitals filed for this visit.      Subjective Assessment - 12/28/16 1523    Subjective I was in the ED about 2 weeks ago with what sounds like a panic attack. Work is sometimes difficult.    Currently in Pain? Yes   Pain Score 2    Pain Location Knee   Pain Orientation Left   Pain Descriptors / Indicators Dull;Aching   Aggravating Factors  Not much pain, more stiff   Pain Relieving Factors Heat, riding bike   Multiple Pain Sites No            [facility] PT Assessment - 12/28/16 0001      AROM   Left Knee Extension 0   Left Knee Flexion 130                     [facility] Adult PT Treatment/Exercise - 12/28/16 0001      Knee/Hip Exercises: Aerobic   Stationary Bike L1 x 6 min   Nustep L2 x 10 min     Knee/Hip Exercises: Machines for Strengthening   Total Gym Leg Press Seat 6, Bil 80# 3x10, LTLE 40# 2x10  VC to keep knees soft  Knee/Hip Exercises: Seated   Long Arc Quad Strengthening;Left;3 sets;10 reps;Weights   Long Arc Quad Weight 4 lbs.     Vasopneumatic   Number Minutes Vasopneumatic  15 minutes   Vasopnuematic Location  Knee   Vasopneumatic Pressure Medium   Vasopneumatic Temperature  3 flakes                  PT Short Term Goals - 11/16/16 1026      PT SHORT TERM GOAL #1   Title Pt will be independent with initial HEP   Time 4   Period Weeks   Status Achieved           PT Long Term Goals - 12/09/16 1537      PT LONG TERM GOAL #1   Title Pt will be independent  with advanced HEP  12/11/16    Time 8   Period Weeks   Status On-going     PT LONG TERM GOAL #3   Title Pt will increase AROM left kneeflexion from 5 - 120 degrees for improved mobility in order to ride in car/perform household chores   Baseline 2-126   Time 8   Period  Weeks   Status Achieved     PT LONG TERM GOAL #4   Title FOTO will improve from 82% limitation to at least 52% for improved functional mobility at home and work   Baseline 64% from 82% limitation   Time 8   Period Weeks   Status On-going     PT LONG TERM GOAL #6   Title Circumferential measurements of right and left within 3 cm indicating decreased edema needed for faster healing and return to work.     Time 8   Period Weeks   Status On-going               Plan - 12/28/16 1528    Clinical Impression Statement Pt very fatigued after work. She is currently working 6 hours a day. Knee AROM 0-130 and very minimal swelling. The swelling today was more than likely from working  6 hrs on her feet.    Rehab Potential Good   PT Frequency 2x / week   PT Duration 6 weeks   PT Treatment/Interventions ADLs/Self Care Home Management;Electrical Stimulation;Cryotherapy;Therapeutic exercise;Neuromuscular re-education;Patient/family education;Manual techniques;Taping   PT Next Visit Plan Knee strength, Game ready, discuss DC plans   Consulted and Agree with Plan of Care Patient      Patient will benefit from skilled therapeutic intervention in order to improve the following deficits and impairments:     Visit Diagnosis: Acute pain of left knee  Stiffness of left knee, not elsewhere classified  Muscle weakness (generalized)  Localized edema  Left knee pain, unspecified chronicity     Problem List Patient Active Problem List   Diagnosis Date Noted  . Obese 10/14/2016  . S/P left TKA 10/13/2016    Tigerlily Christine, PTA 12/28/2016, 4:14 PM  [facility] [address] [place], [place], [address] Phone: [phone]   Fax:  [phone]  Name: [name] MRN: [id number] Date of Birth: Feb 28, 1961  PHYSICAL THERAPY DISCHARGE SUMMARY  Visits from Start of Care: 22  Current functional level related to goals /  functional outcomes: See above for most recent update   Remaining deficits: See above deficits   Education / Equipment: HEP  Plan: Patient agrees to discharge.  Patient goals were partially met. Patient is being discharged due to not  returning since the last visit.  ?????         Google, PT 01/27/17 9:42 AM

## 2016-12-30 ENCOUNTER — Ambulatory Visit: Payer: PRIVATE HEALTH INSURANCE | Admitting: Physical Therapy

## 2016-12-31 ENCOUNTER — Ambulatory Visit: Payer: PRIVATE HEALTH INSURANCE | Admitting: Physical Therapy

## 2016-12-31 ENCOUNTER — Telehealth: Payer: Self-pay | Admitting: Physical Therapy

## 2016-12-31 NOTE — Telephone Encounter (Signed)
Patient was called due to no show.  Patient did not know she had an appointment today and states that she knew she missed her previous appointment due to the bus schedule being messed up.  Pt states that she will contact her case manager to make more appointments.  Zannie Cove, PT 12/31/16 3:20 PM

## 2017-01-28 MED FILL — IBUPROFEN 600 MG TABLET: 600 | 30 days supply | Qty: 90 | Fill #0

## 2017-05-04 MED FILL — IBUPROFEN 600 MG TABLET: 600 | 30 days supply | Qty: 90 | Fill #1

## 2018-04-07 ENCOUNTER — Other Ambulatory Visit (INDEPENDENT_AMBULATORY_CARE_PROVIDER_SITE_OTHER): Payer: 59

## 2018-04-07 ENCOUNTER — Ambulatory Visit (INDEPENDENT_AMBULATORY_CARE_PROVIDER_SITE_OTHER): Payer: 59 | Admitting: Nurse Practitioner

## 2018-04-07 ENCOUNTER — Encounter: Payer: Self-pay | Admitting: Nurse Practitioner

## 2018-04-07 VITALS — BP 142/98 | HR 77 | Temp 98.1°F | Resp 16 | Ht 63.0 in | Wt 167.0 lb

## 2018-04-07 DIAGNOSIS — Z1159 Encounter for screening for other viral diseases: Secondary | ICD-10-CM

## 2018-04-07 DIAGNOSIS — R7309 Other abnormal glucose: Secondary | ICD-10-CM | POA: Diagnosis not present

## 2018-04-07 DIAGNOSIS — Z122 Encounter for screening for malignant neoplasm of respiratory organs: Secondary | ICD-10-CM | POA: Diagnosis not present

## 2018-04-07 DIAGNOSIS — Z1211 Encounter for screening for malignant neoplasm of colon: Secondary | ICD-10-CM

## 2018-04-07 DIAGNOSIS — Z114 Encounter for screening for human immunodeficiency virus [HIV]: Secondary | ICD-10-CM

## 2018-04-07 DIAGNOSIS — Z1231 Encounter for screening mammogram for malignant neoplasm of breast: Secondary | ICD-10-CM | POA: Diagnosis not present

## 2018-04-07 DIAGNOSIS — R030 Elevated blood-pressure reading, without diagnosis of hypertension: Secondary | ICD-10-CM | POA: Diagnosis not present

## 2018-04-07 DIAGNOSIS — H6123 Impacted cerumen, bilateral: Secondary | ICD-10-CM

## 2018-04-07 DIAGNOSIS — Z1239 Encounter for other screening for malignant neoplasm of breast: Secondary | ICD-10-CM

## 2018-04-07 DIAGNOSIS — Z23 Encounter for immunization: Secondary | ICD-10-CM

## 2018-04-07 DIAGNOSIS — Z124 Encounter for screening for malignant neoplasm of cervix: Secondary | ICD-10-CM | POA: Diagnosis not present

## 2018-04-07 DIAGNOSIS — H6122 Impacted cerumen, left ear: Secondary | ICD-10-CM

## 2018-04-07 DIAGNOSIS — Z0001 Encounter for general adult medical examination with abnormal findings: Secondary | ICD-10-CM | POA: Insufficient documentation

## 2018-04-07 DIAGNOSIS — Z9189 Other specified personal risk factors, not elsewhere classified: Secondary | ICD-10-CM | POA: Diagnosis not present

## 2018-04-07 DIAGNOSIS — Z13220 Encounter for screening for lipoid disorders: Secondary | ICD-10-CM

## 2018-04-07 LAB — COMPREHENSIVE METABOLIC PANEL
ALBUMIN: 4.5 g/dL (ref 3.5–5.2)
ALT: 31 U/L (ref 0–35)
AST: 52 U/L — AB (ref 0–37)
Alkaline Phosphatase: 90 U/L (ref 39–117)
BILIRUBIN TOTAL: 0.5 mg/dL (ref 0.2–1.2)
BUN: 7 mg/dL (ref 6–23)
CALCIUM: 9.8 mg/dL (ref 8.4–10.5)
CHLORIDE: 105 meq/L (ref 96–112)
CO2: 28 meq/L (ref 19–32)
CREATININE: 0.54 mg/dL (ref 0.40–1.20)
GFR: 149.88 mL/min (ref >60.00)
Glucose, Bld: 85 mg/dL (ref 70–99)
Potassium: 3.8 mEq/L (ref 3.5–5.1)
SODIUM: 139 meq/L (ref 135–145)
Total Protein: 7.5 g/dL (ref 6.0–8.3)

## 2018-04-07 LAB — CBC
HEMATOCRIT: 39 % (ref 36.0–46.0)
Hemoglobin: 13.5 g/dL (ref 12.0–15.0)
MCHC: 34.5 g/dL (ref 30.0–36.0)
MCV: 101.2 fl — AB (ref 78.0–100.0)
Platelets: 189 10*3/uL (ref 150.0–400.0)
RBC: 3.86 Mil/uL — AB (ref 3.87–5.11)
RDW: 12.7 % (ref 11.5–15.5)
WBC: 4.7 10*3/uL (ref 4.0–10.5)

## 2018-04-07 LAB — TSH: TSH: 0.37 u[IU]/mL (ref 0.35–4.50)

## 2018-04-07 LAB — HEMOGLOBIN A1C: Hgb A1c MFr Bld: 5.5 % (ref 4.6–6.5)

## 2018-04-07 LAB — LIPID PANEL
CHOLESTEROL: 199 mg/dL (ref 0–200)
HDL: 66.4 mg/dL (ref >39.00)
LDL Cholesterol: 103 mg/dL — ABNORMAL HIGH (ref 0–99)
NonHDL: 132.9
Total CHOL/HDL Ratio: 3
Triglycerides: 149 mg/dL (ref 0.0–149.0)
VLDL: 29.8 mg/dL (ref 0.0–40.0)

## 2018-04-07 NOTE — Progress Notes (Signed)
Name: [name]   MRN: [id number]    DOB: [birth year]   Date:04/07/2018       Progress Note  Subjective  Chief Complaint  Chief Complaint  Patient presents with  . Establish Care    CPE    HPI  Ms [name] is establishing care with me today as a new patient to our practice. 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 She is not maintained on any daily medications. She says that she  has not been following with PCP until now, has been going to urgent care as needed for occasional medical needs. Patient presents for annual CPE.   Diet, Exercise: does not watch her diet; swimming and walking dog for regular exercise   USPSTF grade A and B recommendations  Depression: no concerns for anxiety or depression today No flowsheet data found.   Hypertension:  BP Readings from Last 3 Encounters:  04/07/18 (!) 142/98  12/16/16 120/75  12/15/16 140/99   Obesity: Wt Readings from Last 3 Encounters:  04/07/18 167 lb (75.8 kg)  12/15/16 164 lb 8 oz (74.6 kg)  10/13/16 170 lb (77.1 kg)   BMI Readings from Last 3 Encounters:  04/07/18 29.58 kg/m  12/15/16 29.14 kg/m  10/13/16 30.11 kg/m    Alcohol: 1-2 beers a couple of times a week Tobacco use: Current smoker, smoking for 40 years, not ready to quit yet- 1/2 ppd  HIV, hep C: will order HIV and hep C screening today STD testing and prevention (chl/gon/syphilis): no concerns, declines testing   Intimate partner violence: denies, feels safe Sexual History/Pain during Intercourse: no concerns, not currently sexually active  Vaccinations: TDAP given today  Advanced Care Planning: A voluntary discussion about advance care planning including the explanation and discussion of advance directives.  Discussed health care proxy and Living will, and the patient DOES NOT have a living will at present time. If patient does have living will, I have requested they bring this to the clinic to be scanned in to their chart.  Breast cancer: mammogram  ordered today, last mammogram was years ago Cervical cancer screening: PAP is not up to date, she says she was following with GYN provider in past but stopped going, she would like a referral to a new GYN today to follow up for routine womens healthcare needs  Lipids: lipid panel today No results found for: CHOL No results found for: HDL No results found for: LDLCALC No results found for: TRIG No results found for: CHOLHDL No results found for: LDLDIRECT  Glucose: A1c today Glucose, Bld  Date Value Ref Range Status  12/16/2016 84 65 - 99 mg/dL Final  12/13/2016 102 (H) 65 - 99 mg/dL Final  10/15/2016 90 65 - 99 mg/dL Final    Skin cancer: does not routinely wear sunscreen, no concerning lesions or moles Colorectal cancer: No personal or family history of colon ca, no abdominal pain, no bowel changes, no rectal bleeding. Colonoscopy ordered.  Lung cancer:  Referral to pulmonology for lung cancer screening placed  Aspirin: not indicated ECG: not indicated   Patient Active Problem List   Diagnosis Date Noted  . Encounter for general adult medical examination with abnormal findings 04/07/2018  . Elevated blood pressure reading 04/07/2018  . Obese 10/14/2016  . S/P left TKA 10/13/2016    Past Surgical History:  Procedure Laterality Date  . CESAREAN SECTION     x 1  . CHONDROPLASTY Left 10/19/2014   Procedure:  LEFT MEDIAL PETELLA CHONDROPLASTY;  Surgeon: [name], MD;  Location: [facility];  Service: Orthopedics;  Laterality: Left;  . DILATION AND CURETTAGE OF UTERUS  1996  . HYSTEROSCOPY W/D&C  05-11-2000  . KNEE ARTHROSCOPY Left 10/19/2014   Procedure: LEFT ARTHROSCOPY KNEE WITH DEBRIEDMENT;  Surgeon: [name], MD;  Location: [facility];  Service: Orthopedics;  Laterality: Left;  . KNEE ARTHROSCOPY WITH LATERAL MENISECTOMY Left 10/19/2014   Procedure: KNEE ARTHROSCOPY WITH LATERAL MENISECTOMY;  Surgeon: [name], MD;  Location:  [facility];  Service: Orthopedics;  Laterality: Left;  . ROBOTIC ASSISTED TOTAL HYSTERECTOMY  02/26/2009   partial  . TOTAL KNEE ARTHROPLASTY Right 05-26-2010  . TOTAL KNEE ARTHROPLASTY Left 10/13/2016   Procedure: LEFT TOTAL KNEE ARTHROPLASTY;  Surgeon: Paralee Cancel, MD;  Location: [facility];  Service: Orthopedics;  Laterality: Left;  . TUBAL LIGATION      Family History  Problem Relation Age of Onset  . Alcohol abuse Mother   . Cancer Father     Social History   Socioeconomic History  . Marital status: Single    Spouse name: Not on file  . Number of children: Not on file  . Years of education: Not on file  . Highest education level: Not on file  Occupational History  . Not on file  Social Needs  . Financial resource strain: Not on file  . Food insecurity:    Worry: Not on file    Inability: Not on file  . Transportation needs:    Medical: Not on file    Non-medical: Not on file  Tobacco Use  . Smoking status: Current Every Day Smoker    Packs/day: 0.50    Years: 20.00    Pack years: 10.00    Types: Cigarettes  . Smokeless tobacco: Never Used  Substance and Sexual Activity  . Alcohol use: Yes    Alcohol/week: 1.2 - 1.8 oz    Types: 2 - 3 Cans of beer per week  . Drug use: No  . Sexual activity: Never    Birth control/protection: Surgical  Lifestyle  . Physical activity:    Days per week: Not on file    Minutes per session: Not on file  . Stress: Not on file  Relationships  . Social connections:    Talks on phone: Not on file    Gets together: Not on file    Attends religious service: Not on file    Active member of club or organization: Not on file    Attends meetings of clubs or organizations: Not on file    Relationship status: Not on file  . Intimate partner violence:    Fear of current or ex partner: Not on file    Emotionally abused: Not on file    Physically abused: Not on file    Forced sexual activity: Not on file  Other Topics  Concern  . Not on file  Social History Narrative  . Not on file    No current outpatient medications on file.  Allergies  Allergen Reactions  . Tramadol Other (See Comments)    " dont feel right"     ROS  Constitutional: Negative for fever or weight change.  Respiratory: Negative for cough and shortness of breath.   Cardiovascular: Negative for chest pain or palpitations.  Gastrointestinal: Negative for abdominal pain, no bowel changes.  Musculoskeletal: Negative for gait problem or joint swelling.  Skin: Negative for rash.  Neurological: Negative for  dizziness or headache.  No other specific complaints in a complete review of systems (except as listed in HPI above).   Objective  Vitals:   04/07/18 1327  BP: (!) 142/98  Pulse: 77  Resp: 16  Temp: 98.1 F (36.7 C)  TempSrc: Oral  SpO2: 98%  Weight: 167 lb (75.8 kg)  Height: 5\' 3"  (1.6 m)    Body mass index is 29.58 kg/m.  Physical Exam Vital signs reviewed. Constitutional: Patient appears well-developed and well-nourished. No distress.  HENT: Head: Normocephalic and atraumatic. Ears: B TMs obstructed by cerumen, Nose: Nose normal. Mouth/Throat: Oropharynx is clear and moist. No oropharyngeal exudate.  Eyes: Conjunctivae and EOM are normal. Pupils are equal, round, and reactive to light. No scleral icterus.  Neck: Normal range of motion. Neck supple. No cervical adenopathy. No thyromegaly present.  Cardiovascular: Normal rate, regular rhythm and normal heart sounds.  No murmur heard. No BLE edema. Distal pulses intact. Pulmonary/Chest: Effort normal and breath sounds normal. No respiratory distress. Abdominal: Soft, rotund. Bowel sounds are normal, no distension. There is no tenderness. no masses Breast: defd to mammo FEMALE GENITALIA:  defd to GYN Musculoskeletal: Normal range of motion, No gross deformities Neurological: She is alert and oriented to person, place, and time. No cranial nerve deficit.  Coordination, balance, strength, speech and gait are normal.  Skin: Skin is warm and dry. No rash noted. No erythema.  Psychiatric: Patient has a normal mood and affect. behavior is normal. Judgment and thought content normal.  Assessment & Plan RTC in 2 weeks for F/U: elevated BP reading- recheck BP; earwax impaction-irrigation  Impacted cerumen of left ear Attempted Irrigation of left ear only due to patient tolerance She was instructed to use debrox drops x 2 weeks and RTC for repeat ear irrigation

## 2018-04-07 NOTE — Assessment & Plan Note (Addendum)
BP reading is elevated, she says she has not been on BP medications in the past and is hesitant to start any daily medications We discussed monitoring BP readings at home but she says she has no way to check her BP RTC in 2 weeks for F/U to recheck BP, if reading remains elevated at next visit we should consider starting antihypertensive therapy - CBC; Future - Comprehensive metabolic panel; Future - TSH; Future

## 2018-04-07 NOTE — Patient Instructions (Addendum)
Please head downstairs for lab work If any of your test results are critically abnormal, you will be contacted right away. Otherwise, I will contact you within a week about your test results and any recommendations for abnormalities.  Please try to check your blood pressure once daily or at least a few times a week, at the same time each day, and keep a log. Please return in about 2 weeks for follow up so I can recheck your blood pressure and see how you are doing.   Health Maintenance, Female Adopting a healthy lifestyle and getting preventive care can go a long way to promote health and wellness. Talk with your health care provider about what schedule of regular examinations is right for you. This is a good chance for you to check in with your provider about disease prevention and staying healthy. In between checkups, there are plenty of things you can do on your own. Experts have done a lot of research about which lifestyle changes and preventive measures are most likely to keep you healthy. Ask your health care provider for more information. Weight and diet Eat a healthy diet  Be sure to include plenty of vegetables, fruits, low-fat dairy products, and lean protein.  Do not eat a lot of foods high in solid fats, added sugars, or salt.  Get regular exercise. This is one of the most important things you can do for your health. ? Most adults should exercise for at least 150 minutes each week. The exercise should increase your heart rate and make you sweat (moderate-intensity exercise). ? Most adults should also do strengthening exercises at least twice a week. This is in addition to the moderate-intensity exercise.  Maintain a healthy weight  Body mass index (BMI) is a measurement that can be used to identify possible weight problems. It estimates body fat based on height and weight. Your health care provider can help determine your BMI and help you achieve or maintain a healthy  weight.  For females 57 years of age and older: ? A BMI below 18.5 is considered underweight. ? A BMI of 18.5 to 24.9 is normal. ? A BMI of 25 to 29.9 is considered overweight. ? A BMI of 30 and above is considered obese.  Watch levels of cholesterol and blood lipids  You should start having your blood tested for lipids and cholesterol at 57 years of age, then have this test every 5 years.  You may need to have your cholesterol levels checked more often if: ? Your lipid or cholesterol levels are high. ? You are older than 57 years of age. ? You are at high risk for heart disease.  Cancer screening Lung Cancer  Lung cancer screening is recommended for adults 57-61 years old who are at high risk for lung cancer because of a history of smoking.  A yearly low-dose CT scan of the lungs is recommended for people who: ? Currently smoke. ? Have quit within the past 15 years. ? Have at least a 30-pack-year history of smoking. A pack year is smoking an average of one pack of cigarettes a day for 1 year.  Yearly screening should continue until it has been 15 years since you quit.  Yearly screening should stop if you develop a health problem that would prevent you from having lung cancer treatment.  Breast Cancer  Practice breast self-awareness. This means understanding how your breasts normally appear and feel.  It also means doing regular breast self-exams. Let your  health care provider know about any changes, no matter how small.  If you are in your 57s or 30s, you should have a clinical breast exam (CBE) by a health care provider every 1-3 years as part of a regular health exam.  If you are 55 or older, have a CBE every year. Also consider having a breast X-ray (mammogram) every year.  If you have a family history of breast cancer, talk to your health care provider about genetic screening.  If you are at high risk for breast cancer, talk to your health care provider about having an  MRI and a mammogram every year.  Breast cancer gene (BRCA) assessment is recommended for women who have family members with BRCA-related cancers. BRCA-related cancers include: ? Breast. ? Ovarian. ? Tubal. ? Peritoneal cancers.  Results of the assessment will determine the need for genetic counseling and BRCA1 and BRCA2 testing.  Cervical Cancer Your health care provider may recommend that you be screened regularly for cancer of the pelvic organs (ovaries, uterus, and vagina). This screening involves a pelvic examination, including checking for microscopic changes to the surface of your cervix (Pap test). You may be encouraged to have this screening done every 3 years, beginning at age 24.  For women ages 57-65, health care providers may recommend pelvic exams and Pap testing every 3 years, or they may recommend the Pap and pelvic exam, combined with testing for human papilloma virus (HPV), every 5 years. Some types of HPV increase your risk of cervical cancer. Testing for HPV may also be done on women of any age with unclear Pap test results.  Other health care providers may not recommend any screening for nonpregnant women who are considered low risk for pelvic cancer and who do not have symptoms. Ask your health care provider if a screening pelvic exam is right for you.  If you have had past treatment for cervical cancer or a condition that could lead to cancer, you need Pap tests and screening for cancer for at least 20 years after your treatment. If Pap tests have been discontinued, your risk factors (such as having a new sexual partner) need to be reassessed to determine if screening should resume. Some women have medical problems that increase the chance of getting cervical cancer. In these cases, your health care provider may recommend more frequent screening and Pap tests.  Colorectal Cancer  This type of cancer can be detected and often prevented.  Routine colorectal cancer screening  usually begins at 57 years of age and continues through 57 years of age.  Your health care provider may recommend screening at an earlier age if you have risk factors for colon cancer.  Your health care provider may also recommend using home test kits to check for hidden blood in the stool.  A small camera at the end of a tube can be used to examine your colon directly (sigmoidoscopy or colonoscopy). This is done to check for the earliest forms of colorectal cancer.  Routine screening usually begins at age 26.  Direct examination of the colon should be repeated every 5-10 years through 57 years of age. However, you may need to be screened more often if early forms of precancerous polyps or small growths are found.  Skin Cancer  Check your skin from head to toe regularly.  Tell your health care provider about any new moles or changes in moles, especially if there is a change in a mole's shape or color.  Also  tell your health care provider if you have a mole that is larger than the size of a pencil eraser.  Always use sunscreen. Apply sunscreen liberally and repeatedly throughout the day.  Protect yourself by wearing long sleeves, pants, a wide-brimmed hat, and sunglasses whenever you are outside.  Heart disease, diabetes, and high blood pressure  High blood pressure causes heart disease and increases the risk of stroke. High blood pressure is more likely to develop in: ? People who have blood pressure in the high end of the normal range (130-139/85-89 mm Hg). ? People who are overweight or obese. ? People who are African American.  If you are 83-39 years of age, have your blood pressure checked every 3-5 years. If you are 57 years of age or older, have your blood pressure checked every year. You should have your blood pressure measured twice-once when you are at a hospital or clinic, and once when you are not at a hospital or clinic. Record the average of the two measurements. To check  your blood pressure when you are not at a hospital or clinic, you can use: ? An automated blood pressure machine at a pharmacy. ? A home blood pressure monitor.  If you are between 6 years and 42 years old, ask your health care provider if you should take aspirin to prevent strokes.  Have regular diabetes screenings. This involves taking a blood sample to check your fasting blood sugar level. ? If you are at a normal weight and have a low risk for diabetes, have this test once every three years after 57 years of age. ? If you are overweight and have a high risk for diabetes, consider being tested at a younger age or more often. Preventing infection Hepatitis B  If you have a higher risk for hepatitis B, you should be screened for this virus. You are considered at high risk for hepatitis B if: ? You were born in a country where hepatitis B is common. Ask your health care provider which countries are considered high risk. ? Your parents were born in a high-risk country, and you have not been immunized against hepatitis B (hepatitis B vaccine). ? You have HIV or AIDS. ? You use needles to inject street drugs. ? You live with someone who has hepatitis B. ? You have had sex with someone who has hepatitis B. ? You get hemodialysis treatment. ? You take certain medicines for conditions, including cancer, organ transplantation, and autoimmune conditions.  Hepatitis C  Blood testing is recommended for: ? Everyone born from [birth year] through 1965. ? Anyone with known risk factors for hepatitis C.  Sexually transmitted infections (STIs)  You should be screened for sexually transmitted infections (STIs) including gonorrhea and chlamydia if: ? You are sexually active and are younger than 57 years of age. ? You are older than 57 years of age and your health care provider tells you that you are at risk for this type of infection. ? Your sexual activity has changed since you were last screened and you  are at an increased risk for chlamydia or gonorrhea. Ask your health care provider if you are at risk.  If you do not have HIV, but are at risk, it may be recommended that you take a prescription medicine daily to prevent HIV infection. This is called pre-exposure prophylaxis (PrEP). You are considered at risk if: ? You are sexually active and do not regularly use condoms or know the HIV status of your  partner(s). ? You take drugs by injection. ? You are sexually active with a partner who has HIV.  Talk with your health care provider about whether you are at high risk of being infected with HIV. If you choose to begin PrEP, you should first be tested for HIV. You should then be tested every 3 months for as long as you are taking PrEP. Pregnancy  If you are premenopausal and you may become pregnant, ask your health care provider about preconception counseling.  If you may become pregnant, take 400 to 800 micrograms (mcg) of folic acid every day.  If you want to prevent pregnancy, talk to your health care provider about birth control (contraception). Osteoporosis and menopause  Osteoporosis is a disease in which the bones lose minerals and strength with aging. This can result in serious bone fractures. Your risk for osteoporosis can be identified using a bone density scan.  If you are 42 years of age or older, or if you are at risk for osteoporosis and fractures, ask your health care provider if you should be screened.  Ask your health care provider whether you should take a calcium or vitamin D supplement to lower your risk for osteoporosis.  Menopause may have certain physical symptoms and risks.  Hormone replacement therapy may reduce some of these symptoms and risks. Talk to your health care provider about whether hormone replacement therapy is right for you. Follow these instructions at home:  Schedule regular health, dental, and eye exams.  Stay current with your  immunizations.  Do not use any tobacco products including cigarettes, chewing tobacco, or electronic cigarettes.  If you are pregnant, do not drink alcohol.  If you are breastfeeding, limit how much and how often you drink alcohol.  Limit alcohol intake to no more than 1 drink per day for nonpregnant women. One drink equals 12 ounces of beer, 5 ounces of wine, or 1 ounces of hard liquor.  Do not use street drugs.  Do not share needles.  Ask your health care provider for help if you need support or information about quitting drugs.  Tell your health care provider if you often feel depressed.  Tell your health care provider if you have ever been abused or do not feel safe at home. This information is not intended to replace advice given to you by your health care provider. Make sure you discuss any questions you have with your health care provider. Document Released: 04/13/2011 Document Revised: 03/05/2016 Document Reviewed: 07/02/2015 Elsevier Interactive Patient Education  [employer].

## 2018-04-07 NOTE — Assessment & Plan Note (Signed)
-  USPSTF grade A and B recommendations reviewed with patient; age-appropriate recommendations, preventive care, screening tests, etc discussed and encouraged; healthy living and sunscreen use encouraged; see AVS for patient education given to patient. Advanced directives packet declined. -Discussed importance of 150 minutes of physical activity weekly, eat 6 servings of fruit/vegetables daily and drink plenty of water and avoid sweet beverages.  -Follow up and care instructions discussed and provided in AVS.  -Reviewed Health Maintenance:  Need for Tdap vaccination- Tdap vaccine greater than or equal to 7yo IM Encounter for hepatitis C virus screening test for high risk patient- Hepatitis C antibody; Future Screening for HIV (human immunodeficiency virus- HIV antibody; Future Screening for cervical cancer- patient requests GYN referral- Ambulatory referral to Gynecology Screening for colon cancer- Ambulatory referral to Gastroenterology Screening for breast cancer- [facility] DIGITAL SCREENING BILATERAL; Future  Encounter for screening for lung cancer- Ambulatory Referral for Lung Cancer Scre  Screening for cholesterol level Update lipid panel F/U with further recommendations pending lab results She is not fasting-ate potatoes prior to appointment - Lipid panel; Future  Elevated glucose - Hemoglobin A1c; Future

## 2018-04-08 LAB — HEPATITIS C ANTIBODY
HEP C AB: NONREACTIVE
SIGNAL TO CUT-OFF: 0.04 (ref <1.00)

## 2018-04-08 LAB — HIV ANTIBODY (ROUTINE TESTING W REFLEX): HIV 1&2 Ab, 4th Generation: NONREACTIVE

## 2018-04-18 ENCOUNTER — Other Ambulatory Visit: Payer: Self-pay | Admitting: Nurse Practitioner

## 2018-04-18 DIAGNOSIS — N6310 Unspecified lump in the right breast, unspecified quadrant: Secondary | ICD-10-CM

## 2018-04-19 ENCOUNTER — Other Ambulatory Visit (INDEPENDENT_AMBULATORY_CARE_PROVIDER_SITE_OTHER): Payer: 59

## 2018-04-19 ENCOUNTER — Ambulatory Visit: Payer: 59 | Admitting: Nurse Practitioner

## 2018-04-19 ENCOUNTER — Encounter: Payer: Self-pay | Admitting: Nurse Practitioner

## 2018-04-19 VITALS — BP 142/92 | HR 84 | Temp 98.8°F | Resp 16 | Ht 63.0 in | Wt 168.0 lb

## 2018-04-19 DIAGNOSIS — R748 Abnormal levels of other serum enzymes: Secondary | ICD-10-CM

## 2018-04-19 DIAGNOSIS — H6123 Impacted cerumen, bilateral: Secondary | ICD-10-CM

## 2018-04-19 DIAGNOSIS — I10 Essential (primary) hypertension: Secondary | ICD-10-CM

## 2018-04-19 LAB — COMPREHENSIVE METABOLIC PANEL
ALT: 28 U/L (ref 0–35)
AST: 35 U/L (ref 0–37)
Albumin: 4.2 g/dL (ref 3.5–5.2)
Alkaline Phosphatase: 81 U/L (ref 39–117)
BUN: 9 mg/dL (ref 6–23)
CALCIUM: 9.4 mg/dL (ref 8.4–10.5)
CHLORIDE: 106 meq/L (ref 96–112)
CO2: 30 meq/L (ref 19–32)
Creatinine, Ser: 0.72 mg/dL (ref 0.40–1.20)
GFR: 107.52 mL/min (ref >60.00)
Glucose, Bld: 93 mg/dL (ref 70–99)
POTASSIUM: 4.1 meq/L (ref 3.5–5.1)
Sodium: 140 mEq/L (ref 135–145)
Total Bilirubin: 0.5 mg/dL (ref 0.2–1.2)
Total Protein: 7.2 g/dL (ref 6.0–8.3)

## 2018-04-19 MED ORDER — AMLODIPINE BESYLATE 5 MG PO TABS: 5.0000 mg | ORAL_TABLET | Freq: Every day | ORAL | 1 refills | Status: DC

## 2018-04-19 MED FILL — AMLODIPINE BESYLATE 5 MG TA: 5 | 60 days supply | Qty: 60 | Fill #0

## 2018-04-19 NOTE — Patient Instructions (Addendum)
Please START amlodipine 5mg  once daily for your blood pressure. Please try to check your blood pressure once daily or at least a few times a week, at the same time each day, and keep a log. Your blood pressure goal is less than 140/90.  Please return in about 2 weeks so I can recheck your blood pressure and see how you are doing on the new medication   Hypertension Hypertension is another name for high blood pressure. High blood pressure forces your heart to work harder to pump blood. This can cause problems over time. There are two numbers in a blood pressure reading. There is a top number (systolic) over a bottom number (diastolic). It is best to have a blood pressure below 120/80. Healthy choices can help lower your blood pressure. You may need medicine to help lower your blood pressure if:  Your blood pressure cannot be lowered with healthy choices.  Your blood pressure is higher than 130/80.  Follow these instructions at home: Eating and drinking  If directed, follow the DASH eating plan. This diet includes: ? Filling half of your plate at each meal with fruits and vegetables. ? Filling one quarter of your plate at each meal with whole grains. Whole grains include whole wheat pasta, brown rice, and whole grain bread. ? Eating or drinking low-fat dairy products, such as skim milk or low-fat yogurt. ? Filling one quarter of your plate at each meal with low-fat (lean) proteins. Low-fat proteins include fish, skinless chicken, eggs, beans, and tofu. ? Avoiding fatty meat, cured and processed meat, or chicken with skin. ? Avoiding premade or processed food.  Eat less than 1,500 mg of salt (sodium) a day.  Limit alcohol use to no more than 1 drink a day for nonpregnant women and 2 drinks a day for men. One drink equals 12 oz of beer, 5 oz of wine, or 1 oz of hard liquor. Lifestyle  Work with your doctor to stay at a healthy weight or to lose weight. Ask your doctor what the best weight  is for you.  Get at least 30 minutes of exercise that causes your heart to beat faster (aerobic exercise) most days of the week. This may include walking, swimming, or biking.  Get at least 30 minutes of exercise that strengthens your muscles (resistance exercise) at least 3 days a week. This may include lifting weights or pilates.  Do not use any products that contain nicotine or tobacco. This includes cigarettes and e-cigarettes. If you need help quitting, ask your doctor.  Check your blood pressure at home as told by your doctor.  Keep all follow-up visits as told by your doctor. This is important. Medicines  Take over-the-counter and prescription medicines only as told by your doctor. Follow directions carefully.  Do not skip doses of blood pressure medicine. The medicine does not work as well if you skip doses. Skipping doses also puts you at risk for problems.  Ask your doctor about side effects or reactions to medicines that you should watch for. Contact a doctor if:  You think you are having a reaction to the medicine you are taking.  You have headaches that keep coming back (recurring).  You feel dizzy.  You have swelling in your ankles.  You have trouble with your vision. Get help right away if:  You get a very bad headache.  You start to feel confused.  You feel weak or numb.  You feel faint.  You get very bad  pain in your: ? Chest. ? Belly (abdomen).  You throw up (vomit) more than once.  You have trouble breathing. Summary  Hypertension is another name for high blood pressure.  Making healthy choices can help lower blood pressure. If your blood pressure cannot be controlled with healthy choices, you may need to take medicine. This information is not intended to replace advice given to you by your health care provider. Make sure you discuss any questions you have with your health care provider. Document Released: 03/16/2008 Document Revised: 08/26/2016  Document Reviewed: 08/26/2016 Elsevier Interactive Patient Education  [employer].

## 2018-04-19 NOTE — Assessment & Plan Note (Signed)
BP remains elevated today and reported elevated readings at home We discussed home management of HTN including initiation of antihypertensive medication and she is agreeable Will start amlodipine- dosing and side effects discussed Continue to monitor BP at home RTC in 2 weeks for F/U - amLODipine (NORVASC) 5 MG tablet; Take 1 tablet (5 mg total) by mouth daily.  Dispense: 30 tablet; Refill: 1

## 2018-04-19 NOTE — Progress Notes (Addendum)
Name: [name]   MRN: [id number]    DOB: September 06, 1961   Date:04/19/2018       Progress Note  Subjective  Chief Complaint  Chief Complaint  Patient presents with  . Follow-up    blood pressure    HPI [name] is here today for follow up of elevated blood pressure reading, cerumen impaction, and elevated  LFTs, all of which were noted at CPE on 04/07/18.  She has not been maintained on medications for hypertension in the past. Due to her elevated blood pressure reading at CPE on 04/07/18 , she was asked to return for 2 week follow up to have her blood pressure rechecked. Bilateral cerumen impaction was noted at CPE, but she could not tolerate irrigation so we discussed using OTC wax drops and will repeat irrigation today, although she tells me that she never had time to use the earwax drops. She is an occasional drinker and does report occasional use of OTC pain reducers. Denies weakness, headaches, vision changes, chest pain, shortness of breath, edema.  BP Readings from Last 3 Encounters:  04/19/18 (!) 142/92  04/07/18 (!) 142/98  12/16/16 120/75     Patient Active Problem List   Diagnosis Date Noted  . Encounter for general adult medical examination with abnormal findings 04/07/2018  . Elevated blood pressure reading 04/07/2018  . Obese 10/14/2016  . S/P left TKA 10/13/2016    Past Surgical History:  Procedure Laterality Date  . CESAREAN SECTION     x 1  . CHONDROPLASTY Left 10/19/2014   Procedure:  LEFT MEDIAL PETELLA CHONDROPLASTY;  Surgeon: [name], MD;  Location: [facility];  Service: Orthopedics;  Laterality: Left;  . DILATION AND CURETTAGE OF UTERUS  1996  . HYSTEROSCOPY W/D&C  05-11-2000  . KNEE ARTHROSCOPY Left 10/19/2014   Procedure: LEFT ARTHROSCOPY KNEE WITH DEBRIEDMENT;  Surgeon: [name], MD;  Location: [facility];  Service: Orthopedics;  Laterality: Left;  . KNEE ARTHROSCOPY WITH LATERAL MENISECTOMY Left 10/19/2014   Procedure: KNEE ARTHROSCOPY WITH LATERAL MENISECTOMY;  Surgeon: [name], MD;  Location: [facility];  Service: Orthopedics;  Laterality: Left;  . ROBOTIC ASSISTED TOTAL HYSTERECTOMY  02/26/2009   partial  . TOTAL KNEE ARTHROPLASTY Right 05-26-2010  . TOTAL KNEE ARTHROPLASTY Left 10/13/2016   Procedure: LEFT TOTAL KNEE ARTHROPLASTY;  Surgeon: Paralee Cancel, MD;  Location: [facility];  Service: Orthopedics;  Laterality: Left;  . TUBAL LIGATION      Family History  Problem Relation Age of Onset  . Alcohol abuse Mother   . Cancer Father     Social History   Socioeconomic History  . Marital status: Single    Spouse name: Not on file  . Number of children: Not on file  . Years of education: Not on file  . Highest education level: Not on file  Occupational History  . Not on file  Social Needs  . Financial resource strain: Not on file  . Food insecurity:    Worry: Not on file    Inability: Not on file  . Transportation needs:    Medical: Not on file    Non-medical: Not on file  Tobacco Use  . Smoking status: Current Every Day Smoker    Packs/day: 0.50    Years: 20.00    Pack years: 10.00    Types: Cigarettes  . Smokeless tobacco: Never Used  Substance and Sexual Activity  . Alcohol use: Yes  Alcohol/week: 1.2 - 1.8 oz    Types: 2 - 3 Cans of beer per week  . Drug use: No  . Sexual activity: Never    Birth control/protection: Surgical  Lifestyle  . Physical activity:    Days per week: Not on file    Minutes per session: Not on file  . Stress: Not on file  Relationships  . Social connections:    Talks on phone: Not on file    Gets together: Not on file    Attends religious service: Not on file    Active member of club or organization: Not on file    Attends meetings of clubs or organizations: Not on file    Relationship status: Not on file  . Intimate partner violence:    Fear of current or ex partner: Not on file    Emotionally abused: Not on file     Physically abused: Not on file    Forced sexual activity: Not on file  Other Topics Concern  . Not on file  Social History Narrative  . Not on file    No current outpatient medications on file.  Allergies  Allergen Reactions  . Tramadol Other (See Comments)    " dont feel right"     ROS See HPI  Objective  Vitals:   04/19/18 1035  BP: (!) 142/92  Pulse: 84  Resp: 16  Temp: 98.8 F (37.1 C)  TempSrc: Oral  SpO2: 98%  Weight: 168 lb (76.2 kg)  Height: 5\' 3"  (1.6 m)    Body mass index is 29.76 kg/m.  Physical Exam Vital signs reviewed. Constitutional: Patient appears well-developed and well-nourished. No distress.  HENT: Head: Normocephalic and atraumatic. Ears: B TMs obstructed by cerumen, Nose: Nose normal. Mouth/Throat: Oropharynx is clear and moist. No oropharyngeal exudate.  Eyes: Conjunctivae and EOM are normal. Pupils are equal, round, and reactive to light. No scleral icterus.  Neck: Normal range of motion. Neck supple. Cardiovascular: Normal rate, regular rhythm and normal heart sounds.  No murmur heard. No BLE edema. Distal pulses intact. Pulmonary/Chest: Effort normal and breath sounds normal. No respiratory distress. Neurological: She is alert and oriented to person, place, and time. No cranial nerve deficit. Coordination, balance, strength, speech and gait are normal.  Skin: Skin is warm and dry. No rash noted. No erythema.  Psychiatric: Patient has a normal mood and affect. behavior is normal. Judgment and thought content normal.   Assessment & Plan RTC in 2 weeks for F/U: HTN- starting amlodipine; earwax impaction- irrigation  Elevated liver enzymes Noted on CPE labs drawn 04/07/18, will repeat today for trend - Comprehensive metabolic panel; Future  Bilateral impacted cerumen Again today irrigation was attempted of left ear only due to patient tolerance She was instructed to use debrox drops x 2 weeks and RTC for repeat ear irrigation, we  will try again after debrox drops

## 2018-04-21 ENCOUNTER — Telehealth: Payer: Self-pay | Admitting: Nurse Practitioner

## 2018-04-21 NOTE — Telephone Encounter (Signed)
Copied from [place] [id number]. Topic: General - Other >> Apr 21, 2018  1:23 PM [name] wrote: Reason for CRM: Patient scheduled for mammogram and [place] tomorrow at the [facility], needs orders signed in epic

## 2018-04-22 ENCOUNTER — Ambulatory Visit
Admission: RE | Admit: 2018-04-22 | Discharge: 2018-04-22 | Disposition: A | Discharge status: Home / Self Care | Payer: 59 | Source: Ambulatory Visit | Attending: Nurse Practitioner | Admitting: Nurse Practitioner

## 2018-04-22 ENCOUNTER — Ambulatory Visit: Admission: RE | Admit: 2018-04-22 | Payer: Self-pay | Source: Ambulatory Visit

## 2018-04-22 DIAGNOSIS — N6310 Unspecified lump in the right breast, unspecified quadrant: Secondary | ICD-10-CM

## 2018-04-22 DIAGNOSIS — R922 Inconclusive mammogram: Secondary | ICD-10-CM | POA: Diagnosis not present

## 2018-04-22 NOTE — Telephone Encounter (Signed)
Paper has been faxed. 

## 2018-04-26 ENCOUNTER — Encounter: Payer: Self-pay | Admitting: Obstetrics and Gynecology

## 2018-05-05 ENCOUNTER — Ambulatory Visit: Payer: 59 | Admitting: Nurse Practitioner

## 2018-05-17 ENCOUNTER — Encounter: Payer: Self-pay | Admitting: Nurse Practitioner

## 2018-05-19 ENCOUNTER — Ambulatory Visit: Payer: Self-pay | Admitting: *Deleted

## 2018-05-19 NOTE — Telephone Encounter (Signed)
Pt having complaints of feeling lightheaded and weak. Pt states she has taken amlodipine for about a week and states she can no longer take medication because it is making her sick. Pt states she took a dose of the medication on Wednesday and felt sick on the stomach and woozy. Pt states she had to leave work on Wed due to not feeling well and said that her BP was checked at employee health prior to leaving work and the top number was good but the bottom number was in the 100s. Pt  Offered to make appointment but pt states that due to work schedule she would not be able to come in for appointment at this time. Pt advised that she could also seek treatment at [facility] due to current symptoms. Pt verbalized understanding and states that she will try to get transportation there tonight.  Reason for Disposition . Taking a medicine that could cause dizziness (e.g., blood pressure medications, diuretics)  Answer Assessment - Initial Assessment Questions 1. DESCRIPTION: "Describe your dizziness."    Feels lightheaded 2. LIGHTHEADED: "Do you feel lightheaded?" (e.g., somewhat faint, woozy, weak upon standing)    Feels woozy, feels better with lying down  3. VERTIGO: "Do you feel like either you or the room is spinning or tilting?" (i.e. vertigo)     no 4. SEVERITY: "How bad is it?"  "Do you feel like you are going to faint?" "Can you stand and walk?"   - MILD - walking normally   - MODERATE - interferes with normal activities (e.g., work, school)    - SEVERE - unable to stand, requires support to walk, feels like passing out now.      mild 5. ONSET:  "When did the dizziness begin?"    Wednesday 6. AGGRAVATING FACTORS: "Does anything make it worse?" (e.g., standing, change in head position)     No 7. HEART RATE: "Can you tell me your heart rate?" "How many beats in 15 seconds?"  (Note: not all patients can do this)      Not assessed 8. CAUSE: "What do you think is causing the dizziness?"     Recently  started on Amlodipine 9. RECURRENT SYMPTOM: "Have you had dizziness before?" If so, ask: "When was the last time?" "What happened that time?"    Not prior to taking Amlodipine 10. OTHER SYMPTOMS: "Do you have any other symptoms?" (e.g., fever, chest pain, vomiting, diarrhea, bleeding)       No  Protocols used: DIZZINESS Heidi Dach

## 2018-05-23 ENCOUNTER — Other Ambulatory Visit: Payer: Self-pay | Admitting: Acute Care

## 2018-05-23 ENCOUNTER — Encounter: Payer: Self-pay | Admitting: Nurse Practitioner

## 2018-05-23 DIAGNOSIS — F17210 Nicotine dependence, cigarettes, uncomplicated: Secondary | ICD-10-CM

## 2018-05-23 DIAGNOSIS — Z122 Encounter for screening for malignant neoplasm of respiratory organs: Secondary | ICD-10-CM

## 2018-05-30 ENCOUNTER — Telehealth: Payer: Self-pay | Admitting: Nurse Practitioner

## 2018-05-30 NOTE — Telephone Encounter (Signed)
I called pt- no answer/unable to leave vm. Message below is unclear. Need further clarification. Will call back later.

## 2018-05-30 NOTE — Telephone Encounter (Signed)
Copied from [place] [id number]. Topic: Quick Communication - Rx Refill/Question >> May 30, 2018 10:16 AM Marval Regal L wrote: Medication: amLODipine (NORVASC) 5 MG tablet [[id number]] pt called and stated that her blood pressure is 154/113. Pt did not want to be triaged. She is requesting that a lower dosage. page her at [phone] please advise

## 2018-05-31 NOTE — Telephone Encounter (Signed)
Please have keep OV as scheduled on Thursday so we can recheck her blood pressure and determine new plan for her medications. thanks

## 2018-05-31 NOTE — Telephone Encounter (Signed)
I called pt again at the phone number listed in her chart and paged the number below. No answer/unable to leave vm. Need clarification on what she is needing.

## 2018-05-31 NOTE — Telephone Encounter (Signed)
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 She feels good today but is going to get another tech to check it again today. She would like her AMLODIPINE dosage to be lowered because it is making her nauseous. I informed her I do not know if there is a lower dosage we may change it all together. She said that it fine and to call her back with what the decision is.  Please call her cell phone number if she does not answer she si at work and will call back.  Please advise

## 2018-06-01 ENCOUNTER — Encounter: Payer: Self-pay | Admitting: Obstetrics and Gynecology

## 2018-06-01 ENCOUNTER — Ambulatory Visit (INDEPENDENT_AMBULATORY_CARE_PROVIDER_SITE_OTHER): Payer: 59 | Admitting: Obstetrics and Gynecology

## 2018-06-01 VITALS — BP 128/93 | HR 85 | Ht 63.0 in | Wt 164.0 lb

## 2018-06-01 DIAGNOSIS — B379 Candidiasis, unspecified: Secondary | ICD-10-CM

## 2018-06-01 DIAGNOSIS — Z01419 Encounter for gynecological examination (general) (routine) without abnormal findings: Secondary | ICD-10-CM

## 2018-06-01 MED ORDER — NYSTATIN 100000 UNIT/GM EX POWD: Freq: Two times a day (BID) | CUTANEOUS | 1 refills | Status: DC

## 2018-06-01 MED FILL — NYSTATIN 100000 UNIT/GM POW: 100000 | 10 days supply | Qty: 15 | Fill #0

## 2018-06-01 NOTE — Progress Notes (Signed)
GYNECOLOGY ANNUAL PREVENTATIVE CARE ENCOUNTER NOTE  Subjective:   [name] is a 57 y.o. G41P2000 female here for a annual gynecologic exam. Current complaints: annual exam, no issues.  Denies abnormal vaginal bleeding, discharge, pelvic pain, or other gynecologic concerns. Not currently sexually active. Occasional hot flushes that do not bother her. She drinks water and cools off. Had Ra-TLH in 2010, no vaginal bleeding since.   She is seeing PCP for HTN and has been prescribed anti-hypertensive but hasn't been taking it because it makes her dizzy and nauseated. Sees PCP again tomorrow.   Gynecologic History No LMP recorded. Patient has had a hysterectomy. Contraception: status post hysterectomy Last Pap: prior to hysterectomy. Results were: per patient, has never had abnormal pap Last mammogram: 04/2018. Results were: Birads 2  Obstetric History OB History  Gravida Para Term Preterm AB Living  2 2 2  0 0 0  SAB TAB Ectopic Multiple Live Births  0 0 0 0 2    # Outcome Date GA Lbr Len/2nd Weight Sex Delivery Anes PTL Lv  2 Term      Vag-Spont     1 Term      CS-Unspec       Past Medical History:  Diagnosis Date  . Acute meniscal tear of left knee   . Complication of anesthesia 5/10   severe sore throat   . Fibroid   . Numbness    left knee  . OA (osteoarthritis) of knee    oa    Past Surgical History:  Procedure Laterality Date  . CESAREAN SECTION     x 1  . CHONDROPLASTY Left 10/19/2014   Procedure:  LEFT MEDIAL PETELLA CHONDROPLASTY;  Surgeon: [name], MD;  Location: [facility];  Service: Orthopedics;  Laterality: Left;  . DILATION AND CURETTAGE OF UTERUS  1996  . HYSTEROSCOPY W/D&C  05-11-2000  . KNEE ARTHROSCOPY Left 10/19/2014   Procedure: LEFT ARTHROSCOPY KNEE WITH DEBRIEDMENT;  Surgeon: [name], MD;  Location: [facility];  Service: Orthopedics;  Laterality: Left;  . KNEE ARTHROSCOPY WITH LATERAL MENISECTOMY Left  10/19/2014   Procedure: KNEE ARTHROSCOPY WITH LATERAL MENISECTOMY;  Surgeon: [name], MD;  Location: [facility];  Service: Orthopedics;  Laterality: Left;  . ROBOTIC ASSISTED TOTAL HYSTERECTOMY  02/26/2009   partial  . TOTAL KNEE ARTHROPLASTY Right 05-26-2010  . TOTAL KNEE ARTHROPLASTY Left 10/13/2016   Procedure: LEFT TOTAL KNEE ARTHROPLASTY;  Surgeon: Paralee Cancel, MD;  Location: [facility];  Service: Orthopedics;  Laterality: Left;  . TUBAL LIGATION      Current Outpatient Medications on File Prior to Visit  Medication Sig Dispense Refill  . amLODipine (NORVASC) 5 MG tablet Take 1 tablet (5 mg total) by mouth daily. 30 tablet 1   No current facility-administered medications on file prior to visit.     Allergies  Allergen Reactions  . Tramadol Other (See Comments)    " dont feel right"    Social History   Socioeconomic History  . Marital status: Single    Spouse name: Not on file  . Number of children: Not on file  . Years of education: Not on file  . Highest education level: Not on file  Occupational History  . Not on file  Social Needs  . Financial resource strain: Not on file  . Food insecurity:    Worry: Not on file    Inability: Not on file  . Transportation  needs:    Medical: Not on file    Non-medical: Not on file  Tobacco Use  . Smoking status: Current Every Day Smoker    Packs/day: 0.50    Years: 20.00    Pack years: 10.00    Types: Cigarettes  . Smokeless tobacco: Never Used  Substance and Sexual Activity  . Alcohol use: Yes    Alcohol/week: 2.0 - 3.0 standard drinks    Types: 2 - 3 Cans of beer per week  . Drug use: No  . Sexual activity: Never    Birth control/protection: Surgical  Lifestyle  . Physical activity:    Days per week: Not on file    Minutes per session: Not on file  . Stress: Not on file  Relationships  . Social connections:    Talks on phone: Not on file    Gets together: Not on file    Attends religious  service: Not on file    Active member of club or organization: Not on file    Attends meetings of clubs or organizations: Not on file    Relationship status: Not on file  . Intimate partner violence:    Fear of current or ex partner: Not on file    Emotionally abused: Not on file    Physically abused: Not on file    Forced sexual activity: Not on file  Other Topics Concern  . Not on file  Social History Narrative  . Not on file    Family History  Problem Relation Age of Onset  . Alcohol abuse Mother   . Cancer Father     Diet: pretty good Exercise: doesn't exercise much  The following portions of the patient's history were reviewed and updated as appropriate: allergies, current medications, past family history, past medical history, past social history, past surgical history and problem list.  Review of Systems Pertinent items are noted in HPI.   Objective:  BP (!) 128/93   Pulse 85   Ht 5\' 3"  (1.6 m)   Wt 164 lb (74.4 kg)   BMI 29.05 kg/m  CONSTITUTIONAL: Well-developed, well-nourished female in no acute distress.  HENT:  Normocephalic, atraumatic, External right and left ear normal. Oropharynx is clear and moist EYES: Conjunctivae and EOM are normal. Pupils are equal, round, and reactive to light. No scleral icterus.  NECK: Normal range of motion, supple, no masses.  Normal thyroid.  SKIN: Skin is warm and dry. No rash noted. Not diaphoretic. No erythema. No pallor. NEUROLOGIC: Alert and oriented to person, place, and time. Normal reflexes, muscle tone coordination. No cranial nerve deficit noted. PSYCHIATRIC: Normal mood and affect. Normal behavior. Normal judgment and thought content. CARDIOVASCULAR: Normal heart rate noted, regular rhythm RESPIRATORY: Clear to auscultation bilaterally. Effort and breath sounds normal, no problems with respiration noted. BREASTS: Symmetric in size. No masses, skin changes, nipple drainage, or lymphadenopathy. Yeast in infra-breast  folds ABDOMEN: Soft, normal bowel sounds, no distention noted. Yeast in pannus fold. No tenderness, rebound or guarding.  PELVIC: Normal appearing external genitalia; normal appearing vaginal mucosa, cervix surgically absent.  No abnormal discharge noted. Uterus surgically absent, no other palpable masses, no adnexal tenderness. MUSCULOSKELETAL: Normal range of motion. No tenderness.  No cyanosis, clubbing, or edema.  2+ distal pulses.  Assessment and Plan:   1. Well woman exam No pap necessary Declines STI testing Mammogram up to date Benign exam - Ambulatory referral to Gastroenterology  2. Yeast infection In pannus and under breast folds Nystatin powder  sent to pharmacy   Will follow up results of pap smear screen and manage accordingly. Encouraged improvement in diet and exercise.  Mammogram UTD Referral for colonoscopy made   Routine preventative health maintenance measures emphasized. Please refer to After Visit Summary for other counseling recommendations.     Feliz Beam, M.D. [facility]

## 2018-06-01 NOTE — Telephone Encounter (Signed)
Called pt to advise of below. No answer/unable to leave vm. Will try again later.

## 2018-06-02 ENCOUNTER — Encounter: Payer: Self-pay | Admitting: Nurse Practitioner

## 2018-06-02 ENCOUNTER — Ambulatory Visit: Payer: 59 | Admitting: Nurse Practitioner

## 2018-06-02 VITALS — BP 150/98 | HR 76 | Ht 63.0 in | Wt 165.0 lb

## 2018-06-02 DIAGNOSIS — I10 Essential (primary) hypertension: Secondary | ICD-10-CM | POA: Diagnosis not present

## 2018-06-02 MED ORDER — LOSARTAN POTASSIUM 50 MG PO TABS: 50.0000 mg | ORAL_TABLET | Freq: Every day | ORAL | 1 refills | Status: DC

## 2018-06-02 MED FILL — LOSARTAN POTASSIUM 50 MG TA: 50 | 30 days supply | Qty: 30 | Fill #0

## 2018-06-02 NOTE — Assessment & Plan Note (Signed)
We discussed trying a different medication to control BP and she agreeable Losartan rx sent- dosing and side effects discussed We also discussed the role of healthy diet and exercise in the management of HTN and provided additional information on AVS RTC in 2-3 weeks for F/U: HTN- starting losartan-recheck BP and BMET - losartan (COZAAR) 50 MG tablet; Take 1 tablet (50 mg total) by mouth daily.  Dispense: 30 tablet; Refill: 1

## 2018-06-02 NOTE — Patient Instructions (Signed)
Please start losartan 50 once daily for your blood pressure. Please try to check your blood pressure once daily or at least a few times a week, at the same time each day, and keep a log.  Please return in 2-3 weeks for follow up so I can recheck your blood pressure and see how you are doing on the new medication   DASH Eating Plan DASH stands for "Dietary Approaches to Stop Hypertension." The DASH eating plan is a healthy eating plan that has been shown to reduce high blood pressure (hypertension). It may also reduce your risk for type 2 diabetes, heart disease, and stroke. The DASH eating plan may also help with weight loss. What are tips for following this plan? General guidelines  Avoid eating more than 2,300 mg (milligrams) of salt (sodium) a day. If you have hypertension, you may need to reduce your sodium intake to 1,500 mg a day.  Limit alcohol intake to no more than 1 drink a day for nonpregnant women and 2 drinks a day for men. One drink equals 12 oz of beer, 5 oz of wine, or 1 oz of hard liquor.  Work with your health care provider to maintain a healthy body weight or to lose weight. Ask what an ideal weight is for you.  Get at least 30 minutes of exercise that causes your heart to beat faster (aerobic exercise) most days of the week. Activities may include walking, swimming, or biking.  Work with your health care provider or diet and nutrition specialist (dietitian) to adjust your eating plan to your individual calorie needs. Reading food labels  Check food labels for the amount of sodium per serving. Choose foods with less than 5 percent of the Daily Value of sodium. Generally, foods with less than 300 mg of sodium per serving fit into this eating plan.  To find whole grains, look for the word "whole" as the first word in the ingredient list. Shopping  Buy products labeled as "low-sodium" or "no salt added."  Buy fresh foods. Avoid canned foods and premade or frozen  meals. Cooking  Avoid adding salt when cooking. Use salt-free seasonings or herbs instead of table salt or sea salt. Check with your health care provider or pharmacist before using salt substitutes.  Do not fry foods. Cook foods using healthy methods such as baking, boiling, grilling, and broiling instead.  Cook with heart-healthy oils, such as olive, canola, soybean, or sunflower oil. Meal planning   Eat a balanced diet that includes: ? 5 or more servings of fruits and vegetables each day. At each meal, try to fill half of your plate with fruits and vegetables. ? Up to 6-8 servings of whole grains each day. ? Less than 6 oz of lean meat, poultry, or fish each day. A 3-oz serving of meat is about the same size as a deck of cards. One egg equals 1 oz. ? 2 servings of low-fat dairy each day. ? A serving of nuts, seeds, or beans 5 times each week. ? Heart-healthy fats. Healthy fats called Omega-3 fatty acids are found in foods such as flaxseeds and coldwater fish, like sardines, salmon, and mackerel.  Limit how much you eat of the following: ? Canned or prepackaged foods. ? Food that is high in trans fat, such as fried foods. ? Food that is high in saturated fat, such as fatty meat. ? Sweets, desserts, sugary drinks, and other foods with added sugar. ? Full-fat dairy products.  Do not salt  foods before eating.  Try to eat at least 2 vegetarian meals each week.  Eat more home-cooked food and less restaurant, buffet, and fast food.  When eating at a restaurant, ask that your food be prepared with less salt or no salt, if possible. What foods are recommended? The items listed may not be a complete list. Talk with your dietitian about what dietary choices are best for you. Grains Whole-grain or whole-wheat bread. Whole-grain or whole-wheat pasta. Brown rice. [place]. Bulgur. Whole-grain and low-sodium cereals. Pita bread. Low-fat, low-sodium crackers. Whole-wheat flour  tortillas. Vegetables Fresh or frozen vegetables (raw, steamed, roasted, or grilled). Low-sodium or reduced-sodium tomato and vegetable juice. Low-sodium or reduced-sodium tomato sauce and tomato paste. Low-sodium or reduced-sodium canned vegetables. Fruits All fresh, dried, or frozen fruit. Canned fruit in natural juice (without added sugar). Meat and other protein foods Skinless chicken or [place]. Ground chicken or [place]. Pork with fat trimmed off. Fish and seafood. Egg whites. Dried beans, peas, or lentils. Unsalted nuts, nut butters, and seeds. Unsalted canned beans. Lean cuts of beef with fat trimmed off. Low-sodium, lean deli meat. Dairy Low-fat (1%) or fat-free (skim) milk. Fat-free, low-fat, or reduced-fat cheeses. Nonfat, low-sodium ricotta or cottage cheese. Low-fat or nonfat yogurt. Low-fat, low-sodium cheese. Fats and oils Soft margarine without trans fats. Vegetable oil. Low-fat, reduced-fat, or light mayonnaise and salad dressings (reduced-sodium). Canola, safflower, olive, soybean, and sunflower oils. Avocado. Seasoning and other foods Herbs. Spices. Seasoning mixes without salt. Unsalted popcorn and pretzels. Fat-free sweets. What foods are not recommended? The items listed may not be a complete list. Talk with your dietitian about what dietary choices are best for you. Grains Baked goods made with fat, such as croissants, muffins, or some breads. Dry pasta or rice meal packs. Vegetables Creamed or fried vegetables. Vegetables in a cheese sauce. Regular canned vegetables (not low-sodium or reduced-sodium). Regular canned tomato sauce and paste (not low-sodium or reduced-sodium). Regular tomato and vegetable juice (not low-sodium or reduced-sodium). Angie Fava. Olives. Fruits Canned fruit in a light or heavy syrup. Fried fruit. Fruit in cream or butter sauce. Meat and other protein foods Fatty cuts of meat. Ribs. Fried meat. Berniece Salines. Sausage. Bologna and other processed lunch meats.  Salami. Fatback. Hotdogs. Bratwurst. Salted nuts and seeds. Canned beans with added salt. Canned or smoked fish. Whole eggs or egg yolks. Chicken or [place] with skin. Dairy Whole or 2% milk, cream, and half-and-half. Whole or full-fat cream cheese. Whole-fat or sweetened yogurt. Full-fat cheese. Nondairy creamers. Whipped toppings. Processed cheese and cheese spreads. Fats and oils Butter. Stick margarine. Lard. Shortening. Ghee. Bacon fat. Tropical oils, such as coconut, palm kernel, or palm oil. Seasoning and other foods Salted popcorn and pretzels. Onion salt, garlic salt, seasoned salt, table salt, and sea salt. Worcestershire sauce. Tartar sauce. Barbecue sauce. Teriyaki sauce. Soy sauce, including reduced-sodium. Steak sauce. Canned and packaged gravies. Fish sauce. Oyster sauce. Cocktail sauce. Horseradish that you find on the shelf. Ketchup. Mustard. Meat flavorings and tenderizers. Bouillon cubes. Hot sauce and Tabasco sauce. Premade or packaged marinades. Premade or packaged taco seasonings. Relishes. Regular salad dressings. Where to find more information:  National Heart, Lung, and [place]: https://wilson-eaton.com/  American Heart Association: www.heart.org Summary  The DASH eating plan is a healthy eating plan that has been shown to reduce high blood pressure (hypertension). It may also reduce your risk for type 2 diabetes, heart disease, and stroke.  With the DASH eating plan, you should limit salt (sodium) intake to 2,300  mg a day. If you have hypertension, you may need to reduce your sodium intake to 1,500 mg a day.  When on the DASH eating plan, aim to eat more fresh fruits and vegetables, whole grains, lean proteins, low-fat dairy, and heart-healthy fats.  Work with your health care provider or diet and nutrition specialist (dietitian) to adjust your eating plan to your individual calorie needs. This information is not intended to replace advice given to you by your health  care provider. Make sure you discuss any questions you have with your health care provider. Document Released: 09/17/2011 Document Revised: 09/21/2016 Document Reviewed: 09/21/2016 Elsevier Interactive Patient Education  [employer].

## 2018-06-02 NOTE — Telephone Encounter (Signed)
Patient is here today for OV to address this. Closing phone note.

## 2018-06-02 NOTE — Progress Notes (Signed)
Name: [name]   MRN: [id number]    DOB: [birth year]   Date:06/02/2018       Progress Note  Subjective  Chief Complaint  Follow up  HPI  [name] is here today for follow up of HTN, she was started on amlodipine 5 daily at last OV on 7/9 for repeatedly elevated BP readings but stopped taking the medication after about 2 weeks due to side effects of  Nausea and weakness. Reports she has continued to check her BP readings since stopping amlodipine and they remain elevated. Denies headaches, vision changes, chest pain, shortness of breath, edema. She says she overall feels well today and nausea and weakness have resolved since stopping the amlodipine  BP Readings from Last 3 Encounters:  06/02/18 (!) 150/98  06/01/18 (!) 128/93  04/19/18 (!) 142/92    Patient Active Problem List   Diagnosis Date Noted  . Encounter for general adult medical examination with abnormal findings 04/07/2018  . Elevated blood pressure reading 04/07/2018  . Obese 10/14/2016  . S/P left TKA 10/13/2016    Past Surgical History:  Procedure Laterality Date  . CESAREAN SECTION     x 1  . CHONDROPLASTY Left 10/19/2014   Procedure:  LEFT MEDIAL PETELLA CHONDROPLASTY;  Surgeon: Maureen Pole, MD;  Location: [facility];  Service: Orthopedics;  Laterality: Left;  . DILATION AND CURETTAGE OF UTERUS  1996  . HYSTEROSCOPY W/D&C  05-11-2000  . KNEE ARTHROSCOPY Left 10/19/2014   Procedure: LEFT ARTHROSCOPY KNEE WITH DEBRIEDMENT;  Surgeon: Maureen Pole, MD;  Location: [facility];  Service: Orthopedics;  Laterality: Left;  . KNEE ARTHROSCOPY WITH LATERAL MENISECTOMY Left 10/19/2014   Procedure: KNEE ARTHROSCOPY WITH LATERAL MENISECTOMY;  Surgeon: Maureen Pole, MD;  Location: [facility];  Service: Orthopedics;  Laterality: Left;  . ROBOTIC ASSISTED TOTAL HYSTERECTOMY  02/26/2009   partial  . TOTAL KNEE ARTHROPLASTY Right 05-26-2010  . TOTAL KNEE ARTHROPLASTY Left  10/13/2016   Procedure: LEFT TOTAL KNEE ARTHROPLASTY;  Surgeon: Maureen Cancel, MD;  Location: [facility];  Service: Orthopedics;  Laterality: Left;  . TUBAL LIGATION      Family History  Problem Relation Age of Onset  . Alcohol abuse Mother   . Cancer Father     Social History   Socioeconomic History  . Marital status: Single    Spouse name: Not on file  . Number of children: Not on file  . Years of education: Not on file  . Highest education level: Not on file  Occupational History  . Not on file  Social Needs  . Financial resource strain: Not on file  . Food insecurity:    Worry: Not on file    Inability: Not on file  . Transportation needs:    Medical: Not on file    Non-medical: Not on file  Tobacco Use  . Smoking status: Current Every Day Smoker    Packs/day: 0.50    Years: 20.00    Pack years: 10.00    Types: Cigarettes  . Smokeless tobacco: Never Used  Substance and Sexual Activity  . Alcohol use: Yes    Alcohol/week: 2.0 - 3.0 standard drinks    Types: 2 - 3 Cans of beer per week  . Drug use: No  . Sexual activity: Never    Birth control/protection: Surgical  Lifestyle  . Physical activity:    Days per week: Not on file    Minutes per session: Not  on file  . Stress: Not on file  Relationships  . Social connections:    Talks on phone: Not on file    Gets together: Not on file    Attends religious service: Not on file    Active member of club or organization: Not on file    Attends meetings of clubs or organizations: Not on file    Relationship status: Not on file  . Intimate partner violence:    Fear of current or ex partner: Not on file    Emotionally abused: Not on file    Physically abused: Not on file    Forced sexual activity: Not on file  Other Topics Concern  . Not on file  Social History Narrative  . Not on file     Current Outpatient Medications:  .  nystatin (MYCOSTATIN/NYSTOP) powder, Apply topically 2 (two) times daily., Disp: 15 g,  Rfl: 1 .  amLODipine (NORVASC) 5 MG tablet, Take 1 tablet (5 mg total) by mouth daily. (Patient not taking: Reported on 06/02/2018), Disp: 30 tablet, Rfl: 1  Allergies  Allergen Reactions  . Tramadol Other (See Comments)    " dont feel right"     ROS See HPI  Objective  Vitals:   06/02/18 0817  BP: (!) 150/98  Pulse: 76  SpO2: 98%  Weight: 165 lb (74.8 kg)  Height: 5\' 3"  (1.6 m)    Body mass index is 29.23 kg/m.  Physical Exam Vital signs reviewed. Constitutional: Patient appears well-developed and well-nourished. No distress.  HENT: Head: Normocephalic and atraumatic.  Nose: Nose normal. Mouth/Throat: Oropharynx is clear and moist. No oropharyngeal exudate.  Eyes: Conjunctivae and EOM are normal. Pupils are equal, round, and reactive to light. No scleral icterus.  Neck: Normal range of motion. Neck supple.  Cardiovascular: Normal rate, regular rhythm and normal heart sounds.  No murmur heard. No BLE edema. Distal pulses intact. Pulmonary/Chest: Effort normal and breath sounds normal. No respiratory distress. Neurological: She is alert and oriented to person, place, and time. No cranial nerve deficit. Coordination, balance, strength, speech and gait are normal.  Skin: Skin is warm and dry. No rash noted. No erythema.  Psychiatric: Patient has a normal mood and affect. behavior is normal. Judgment and thought content normal.  Assessment & Plan RTC in 2-3 weeks for F/U: HTN- starting losartan-recheck BP and BMET

## 2018-07-04 ENCOUNTER — Ambulatory Visit: Payer: Self-pay | Admitting: Nurse Practitioner

## 2018-07-04 DIAGNOSIS — Z0289 Encounter for other administrative examinations: Secondary | ICD-10-CM

## 2018-07-06 ENCOUNTER — Encounter: Payer: Self-pay | Admitting: Acute Care

## 2018-07-06 ENCOUNTER — Ambulatory Visit (INDEPENDENT_AMBULATORY_CARE_PROVIDER_SITE_OTHER): Payer: 59 | Admitting: Acute Care

## 2018-07-06 ENCOUNTER — Inpatient Hospital Stay: Admission: RE | Admit: 2018-07-06 | Payer: Self-pay | Source: Ambulatory Visit

## 2018-07-06 DIAGNOSIS — F17210 Nicotine dependence, cigarettes, uncomplicated: Secondary | ICD-10-CM

## 2018-07-06 NOTE — Progress Notes (Signed)
Shared Decision Making Visit Lung Cancer Screening Program [phone])   Eligibility:  Age 57 y.o.  Pack Years Smoking History Calculation 45 pack year smoking history (# packs/per year x # years smoked)  Recent History of coughing up blood  no  Unexplained weight loss? no ( >Than 15 pounds within the last 6 months )  Prior History Lung / other cancer no (Diagnosis within the last 5 years already requiring surveillance chest CT Scans).  Smoking Status Current Smoker  Former Smokers: Years since quit: NA  Quit Date: NA  Visit Components:  Discussion included one or more decision making aids. yes  Discussion included risk/benefits of screening. yes  Discussion included potential follow up diagnostic testing for abnormal scans. yes  Discussion included meaning and risk of over diagnosis. yes  Discussion included meaning and risk of False Positives. yes  Discussion included meaning of total radiation exposure. yes  Counseling Included:  Importance of adherence to annual lung cancer LDCT screening. yes  Impact of comorbidities on ability to participate in the program. yes  Ability and willingness to under diagnostic treatment. yes  Smoking Cessation Counseling:  Current Smokers:   Discussed importance of smoking cessation. yes  Information about tobacco cessation classes and interventions provided to patient. yes  Patient provided with "ticket" for LDCT Scan. yes  Symptomatic Patient. no  Counseling  Diagnosis Code: Tobacco Use Z72.0  Asymptomatic Patient yes  Counseling (Intermediate counseling: > three minutes counseling) Q6761  Former Smokers:   Discussed the importance of maintaining cigarette abstinence. yes  Diagnosis Code: Personal History of Nicotine Dependence. P50.932  Information about tobacco cessation classes and interventions provided to patient. Yes  Patient provided with "ticket" for LDCT Scan. yes  Written Order for Lung Cancer  Screening with LDCT placed in Epic. Yes (CT Chest Lung Cancer Screening Low Dose W/O CM) [id number] Z12.2-Screening of respiratory organs Z87.891-Personal history of nicotine dependence  I have spent 25 minutes of face to face time with Ms. Byrum discussing the risks and benefits of lung cancer screening. We viewed a power point together that explained in detail the above noted topics. We paused at intervals to allow for questions to be asked and answered to ensure understanding.We discussed that the single most powerful action that she can take to decrease her risk of developing lung cancer is to quit smoking. We discussed whether or not she is ready to commit to setting a quit date. We discussed options for tools to aid in quitting smoking including nicotine replacement therapy, non-nicotine medications, support groups, Quit Smart classes, and behavior modification. We discussed that often times setting smaller, more achievable goals, such as eliminating 1 cigarette a day for a week and then 2 cigarettes a day for a week can be helpful in slowly decreasing the number of cigarettes smoked. This allows for a sense of accomplishment as well as providing a clinical benefit. I gave her the " Be Stronger Than Your Excuses" card with contact information for community resources, classes, free nicotine replacement therapy, and access to mobile apps, text messaging, and on-line smoking cessation help. I have also given her my card and contact information in the event she needs to contact me. We discussed the time and location of the scan, and that either Doroteo Glassman RN or I will call with the results within 24-48 hours of receiving them. I have offered her  a copy of the power point we viewed  as a resource in the event they need reinforcement of  the concepts we discussed today in the office. The patient verbalized understanding of all of  the above and had no further questions upon leaving the office. They have my  contact information in the event they have any further questions.  I spent 5 minutes counseling on smoking cessation and the health risks of continued tobacco abuse.  I explained to the patient that there has been a high incidence of coronary artery disease noted on these exams. I explained that this is a non-gated exam therefore degree or severity cannot be determined. This patient is not currently on statin therapy. I have asked the patient to follow-up with their PCP regarding any incidental finding of coronary artery disease and management with diet or medication as their PCP  feels is clinically indicated. The patient verbalized understanding of the above and had no further questions upon completion of the visit.      Magdalen Spatz, NP  07/06/2018 9:45 AM

## 2018-07-07 ENCOUNTER — Ambulatory Visit (INDEPENDENT_AMBULATORY_CARE_PROVIDER_SITE_OTHER)
Admission: RE | Admit: 2018-07-07 | Discharge: 2018-07-07 | Disposition: A | Discharge status: Home / Self Care | Payer: 59 | Source: Ambulatory Visit | Attending: Acute Care | Admitting: Acute Care

## 2018-07-07 DIAGNOSIS — F17210 Nicotine dependence, cigarettes, uncomplicated: Secondary | ICD-10-CM | POA: Diagnosis not present

## 2018-07-07 DIAGNOSIS — Z122 Encounter for screening for malignant neoplasm of respiratory organs: Secondary | ICD-10-CM

## 2018-07-26 ENCOUNTER — Other Ambulatory Visit: Payer: Self-pay | Admitting: Acute Care

## 2018-07-26 ENCOUNTER — Encounter: Payer: Self-pay | Admitting: *Deleted

## 2018-07-26 DIAGNOSIS — F17210 Nicotine dependence, cigarettes, uncomplicated: Secondary | ICD-10-CM

## 2018-07-26 DIAGNOSIS — Z122 Encounter for screening for malignant neoplasm of respiratory organs: Secondary | ICD-10-CM

## 2018-08-01 ENCOUNTER — Telehealth: Payer: Self-pay | Admitting: Nurse Practitioner

## 2018-08-01 NOTE — Telephone Encounter (Signed)
I received copy of her chest CT from pulmonology, which noted CAD. Please have her schedule OV to discuss treatment options of this finding.

## 2018-08-01 NOTE — Telephone Encounter (Signed)
error 

## 2018-08-02 NOTE — Telephone Encounter (Signed)
Called patient again. No answer/unable to leave vm. Will try again later.

## 2018-08-02 NOTE — Telephone Encounter (Addendum)
Called pt to advise of below. No answer/unable to leave vm. Will try again later. CRM created in case patient calls back.

## 2018-08-18 NOTE — Telephone Encounter (Signed)
Left detailed message with a female advising patient to call back to schedule OV with PCP to discuss below.

## 2018-08-24 ENCOUNTER — Ambulatory Visit: Payer: Self-pay | Admitting: Nurse Practitioner

## 2018-09-01 ENCOUNTER — Ambulatory Visit: Payer: 59 | Admitting: Nurse Practitioner

## 2018-09-01 ENCOUNTER — Encounter: Payer: Self-pay | Admitting: Nurse Practitioner

## 2018-09-01 VITALS — BP 122/96 | HR 88 | Ht 63.0 in | Wt 161.0 lb

## 2018-09-01 DIAGNOSIS — F17200 Nicotine dependence, unspecified, uncomplicated: Secondary | ICD-10-CM

## 2018-09-01 DIAGNOSIS — I7090 Unspecified atherosclerosis: Secondary | ICD-10-CM

## 2018-09-01 DIAGNOSIS — I10 Essential (primary) hypertension: Secondary | ICD-10-CM | POA: Diagnosis not present

## 2018-09-01 MED ORDER — LISINOPRIL 10 MG PO TABS: 10.0000 mg | ORAL_TABLET | Freq: Every day | ORAL | 1 refills | Status: DC

## 2018-09-01 MED FILL — LISINOPRIL 10 MG TABS: 10 | 30 days supply | Qty: 30 | Fill #0

## 2018-09-01 NOTE — Progress Notes (Signed)
[name] is a 57 y.o. female with the following history as recorded in EpicCare:  Patient Active Problem List   Diagnosis Date Noted  . Hypertension 06/02/2018  . Encounter for general adult medical examination with abnormal findings 04/07/2018  . Obese 10/14/2016  . S/P left TKA 10/13/2016    Current Outpatient Medications  Medication Sig Dispense Refill  . nystatin (MYCOSTATIN/NYSTOP) powder Apply topically 2 (two) times daily. 15 g 1  . lisinopril (PRINIVIL,ZESTRIL) 10 MG tablet Take 1 tablet (10 mg total) by mouth daily. 30 tablet 1   No current facility-administered medications for this visit.     Allergies: Amlodipine and Tramadol  Past Medical History:  Diagnosis Date  . Acute meniscal tear of left knee   . Complication of anesthesia 5/10   severe sore throat   . Fibroid   . Numbness    left knee  . OA (osteoarthritis) of knee    oa    Past Surgical History:  Procedure Laterality Date  . CESAREAN SECTION     x 1  . CHONDROPLASTY Left 10/19/2014   Procedure:  LEFT MEDIAL PETELLA CHONDROPLASTY;  Surgeon: [name], MD;  Location: [facility];  Service: Orthopedics;  Laterality: Left;  . DILATION AND CURETTAGE OF UTERUS  1996  . HYSTEROSCOPY W/D&C  05-11-2000  . KNEE ARTHROSCOPY Left 10/19/2014   Procedure: LEFT ARTHROSCOPY KNEE WITH DEBRIEDMENT;  Surgeon: [name], MD;  Location: [facility];  Service: Orthopedics;  Laterality: Left;  . KNEE ARTHROSCOPY WITH LATERAL MENISECTOMY Left 10/19/2014   Procedure: KNEE ARTHROSCOPY WITH LATERAL MENISECTOMY;  Surgeon: [name], MD;  Location: [facility];  Service: Orthopedics;  Laterality: Left;  . ROBOTIC ASSISTED TOTAL HYSTERECTOMY  02/26/2009   partial  . TOTAL KNEE ARTHROPLASTY Right 05-26-2010  . TOTAL KNEE ARTHROPLASTY Left 10/13/2016   Procedure: LEFT TOTAL KNEE ARTHROPLASTY;  Surgeon: Paralee Cancel, MD;  Location: [facility];  Service: Orthopedics;  Laterality:  Left;  . TUBAL LIGATION      Family History  Problem Relation Age of Onset  . Alcohol abuse Mother   . Cancer Father     Social History   Tobacco Use  . Smoking status: Current Every Day Smoker    Packs/day: 1.25    Years: 36.00    Pack years: 45.00    Types: Cigarettes  . Smokeless tobacco: Never Used  Substance Use Topics  . Alcohol use: Yes    Alcohol/week: 2.0 - 3.0 standard drinks    Types: 2 - 3 Cans of beer per week     Subjective:  [name] is here today for follow up of recent CT chest lung cancer screening, done by pulmonology on 07/07/18, noted to have mild CAD on ct scan and referred back to PCP for risk factor modification  The 10-year ASCVD risk score Mikey Bussing DC Brooke Bonito., et al., 2013) is: 7.4%   Values used to calculate the score:     Age: 72 years     Sex: Female     Is Non-Hispanic African American: Yes     Diabetic: No     Tobacco smoker: Yes     Systolic Blood Pressure: 096 mmHg     Is BP treated: Yes     HDL Cholesterol: 66.4 mg/dL     Total Cholesterol: 199 mg/dL   Lab Results  Component Value Date   CHOL 199 04/07/2018   HDL 66.40 04/07/2018   [place]  103 (H) 04/07/2018   TRIG 149.0 04/07/2018   CHOLHDL 3 04/07/2018   Her blood pressure is also elevated today, we started her on losartan 50 daily at last OV on 06/02/18, she never returned for follow up  She says she has not been losartan taking daily, the medicine makes her feel weak, she takes it some days though, she has been checking her blood pressure at work but did not record the numbers  BP Readings from Last 3 Encounters:  09/01/18 (!) 122/96  06/02/18 (!) 150/98  06/01/18 (!) 128/93   She is still smoking, has cut back, but is not ready to quit yet.  ROS- See HPI  Objective:  Vitals:   09/01/18 1008  BP: (!) 122/96  Pulse: 88  SpO2: 99%  Weight: 161 lb (73 kg)  Height: 5\' 3"  (1.6 m)    General: Well developed, well nourished, in no acute distress  Skin : Warm and dry.  Head:  Normocephalic and atraumatic  Eyes: Sclera and conjunctiva clear; pupils round and reactive to light; extraocular movements intact  Oropharynx: Pink, supple. No suspicious lesions  Neck: Supple without thyromegaly, adenopathy  Lungs: effort normal,  No respiratory distress. CVS exam: normal rate and regular rhythm.  Extremities: No edema, cyanosis, clubbing  Vessels: Symmetric bilaterally  Neurologic: Alert and oriented; speech intact; face symmetrical; moves all extremities well; CNII-XII intact without focal deficit  Psychiatric: Normal mood and affect.  Assessment:  1. Hypertension, unspecified type   2. Atherosclerosis   3. Smoker     Plan:   Return in about 2 weeks (around 09/15/2018) for F/U- HTN- changing blood presure mediations, reck BP, BMET.  No orders of the defined types were placed in this encounter.   Requested Prescriptions   Signed Prescriptions Disp Refills  . lisinopril (PRINIVIL,ZESTRIL) 10 MG tablet 30 tablet 1    Sig: Take 1 tablet (10 mg total) by mouth daily.

## 2018-09-01 NOTE — Assessment & Plan Note (Signed)
Current ascvd 7.4, we discussed risk factor modification, managing blood pressure, cholesterol, smoking cessation, to reduce risk to MI, CVA Will work on improving BP control with medication change today She is thinking of quitting smoking Plan to recheck lipids around January 2020

## 2018-09-01 NOTE — Patient Instructions (Signed)
Stop losartan  Start lisinopril  Please try to check your blood pressure once daily or at least a few times a week, at the same time each day, and keep a log.  Return in 2 weeks

## 2018-09-01 NOTE — Assessment & Plan Note (Signed)
Discussed the role fo daily medication compliance in the managent of HTN, will try switching to a new agent due to side effects noted with losartan lisinporil Rx sent- dosing, side effects discussed RTC in 2 weeks for F/U- recheck BP, BMET - lisinopril (PRINIVIL,ZESTRIL) 10 MG tablet; Take 1 tablet (10 mg total) by mouth daily.  Dispense: 30 tablet; Refill: 1

## 2018-09-01 NOTE — Assessment & Plan Note (Signed)
Asked about tobacco use. She was advised to quit. Assess willingness to make an attempt: She is not ready to quit but is cutting back Assist in quit attempt: We discussed both medication options and behavoiral modifications. Arrange follow up: She is not interested in medication options, will return if she feels lik she needs help to quit smoking

## 2018-09-15 ENCOUNTER — Ambulatory Visit: Payer: Self-pay | Admitting: Nurse Practitioner

## 2018-09-22 ENCOUNTER — Ambulatory Visit: Payer: 59 | Admitting: Nurse Practitioner

## 2018-09-30 ENCOUNTER — Other Ambulatory Visit (INDEPENDENT_AMBULATORY_CARE_PROVIDER_SITE_OTHER): Payer: 59

## 2018-09-30 ENCOUNTER — Ambulatory Visit: Payer: 59 | Admitting: Nurse Practitioner

## 2018-09-30 ENCOUNTER — Encounter: Payer: Self-pay | Admitting: Nurse Practitioner

## 2018-09-30 VITALS — BP 124/94 | HR 82 | Ht 63.0 in | Wt 166.0 lb

## 2018-09-30 DIAGNOSIS — I10 Essential (primary) hypertension: Secondary | ICD-10-CM

## 2018-09-30 LAB — BASIC METABOLIC PANEL
BUN: 17 mg/dL (ref 6–23)
CO2: 28 meq/L (ref 19–32)
Calcium: 9.3 mg/dL (ref 8.4–10.5)
Chloride: 108 mEq/L (ref 96–112)
Creatinine, Ser: 0.66 mg/dL (ref 0.40–1.20)
GFR: 118.69 mL/min (ref >60.00)
Glucose, Bld: 120 mg/dL — ABNORMAL HIGH (ref 70–99)
POTASSIUM: 3.9 meq/L (ref 3.5–5.1)
Sodium: 141 mEq/L (ref 135–145)

## 2018-09-30 NOTE — Assessment & Plan Note (Addendum)
BP is just slightly elevated still, could likely get control with daily low dose of lisinopril and lifestyle modifications which we discussed We also discussed the long term risks of uncontrolled HTN including MI, stroke, renal dysfunction and she was encouraged to take medication daily to reduce these risks  Additional education provided on AVS She says she is agreeable to trying to take medication daily now, will have her RTC in about 1 month for F/u to recheck BP - Basic metabolic panel; Future

## 2018-09-30 NOTE — Progress Notes (Signed)
[name] is a 57 y.o. female with the following history as recorded in Epic[employer]:  Patient Active Problem List   Diagnosis Date Noted  . Smoker 09/01/2018  . Atherosclerosis 09/01/2018  . Hypertension 06/02/2018  . Encounter for general adult medical examination with abnormal findings 04/07/2018  . Obese 10/14/2016  . S/P left TKA 10/13/2016    Current Outpatient Medications  Medication Sig Dispense Refill  . lisinopril (PRINIVIL,ZESTRIL) 10 MG tablet Take 1 tablet (10 mg total) by mouth daily. 30 tablet 1  . nystatin (MYCOSTATIN/NYSTOP) powder Apply topically 2 (two) times daily. 15 g 1   No current facility-administered medications for this visit.     Allergies: Amlodipine; Losartan; and Tramadol  Past Medical History:  Diagnosis Date  . Acute meniscal tear of left knee   . Complication of anesthesia 5/10   severe sore throat   . Fibroid   . Numbness    left knee  . OA (osteoarthritis) of knee    oa    Past Surgical History:  Procedure Laterality Date  . CESAREAN SECTION     x 1  . CHONDROPLASTY Left 10/19/2014   Procedure:  LEFT MEDIAL PETELLA CHONDROPLASTY;  Surgeon: Maureen Pole, MD;  Location: [facility];  Service: Orthopedics;  Laterality: Left;  . DILATION AND CURETTAGE OF UTERUS  1996  . HYSTEROSCOPY W/D&C  05-11-2000  . KNEE ARTHROSCOPY Left 10/19/2014   Procedure: LEFT ARTHROSCOPY KNEE WITH DEBRIEDMENT;  Surgeon: Maureen Pole, MD;  Location: [facility];  Service: Orthopedics;  Laterality: Left;  . KNEE ARTHROSCOPY WITH LATERAL MENISECTOMY Left 10/19/2014   Procedure: KNEE ARTHROSCOPY WITH LATERAL MENISECTOMY;  Surgeon: Maureen Pole, MD;  Location: [facility];  Service: Orthopedics;  Laterality: Left;  . ROBOTIC ASSISTED TOTAL HYSTERECTOMY  02/26/2009   partial  . TOTAL KNEE ARTHROPLASTY Right 05-26-2010  . TOTAL KNEE ARTHROPLASTY Left 10/13/2016   Procedure: LEFT TOTAL KNEE ARTHROPLASTY;  Surgeon: Maureen Cancel,  MD;  Location: [facility];  Service: Orthopedics;  Laterality: Left;  . TUBAL LIGATION      Family History  Problem Relation Age of Onset  . Alcohol abuse Mother   . Cancer Father     Social History   Tobacco Use  . Smoking status: Current Every Day Smoker    Packs/day: 1.25    Years: 36.00    Pack years: 45.00    Types: Cigarettes  . Smokeless tobacco: Never Used  Substance Use Topics  . Alcohol use: Yes    Alcohol/week: 2.0 - 3.0 standard drinks    Types: 2 - 3 Cans of beer per week     Subjective:  Ms [name] is here today for HTN follow up, switched from losartan to lisinopril at her last OV on 09/01/18 due to the losartan causing her to feel weak. She tells me today that she has switched to the lisinopril as instructed and has not noticed any weakness or other adverse effects to the medication, but does say that she "honestly just does not take the medicine every day" because she "just does not want to take medication daily." She has been checking bp regularly since her last OV on 11/21, readings have been around 120s/70s-90s.  Denies headaches, vision changes, chest pain, shortness of breath, edema.  BP Readings from Last 3 Encounters:  09/30/18 (!) 124/94  09/01/18 (!) 122/96  06/02/18 (!) 150/98    ROS- See HPI  Objective:  Vitals:   09/30/18  1501  BP: (!) 124/94  Pulse: 82  SpO2: 96%  Weight: 166 lb (75.3 kg)  Height: 5\' 3"  (1.6 m)    General: Well developed, well nourished, in no acute distress  Skin : Warm and dry.  Head: Normocephalic and atraumatic  Eyes: Sclera and conjunctiva clear; pupils round and reactive to light; extraocular movements intact  Oropharynx: Pink, supple. No suspicious lesions  Neck: Supple without thyromegaly, adenopathy  Lungs: effort normal,  No respiratory distress. CVS exam: normal rate and regular rhythm.  Extremities: No edema, cyanosis, clubbing  Vessels: Symmetric bilaterally  Neurologic: Alert and oriented; speech intact; face  symmetrical; moves all extremities well; CNII-XII intact without focal deficit  Psychiatric: Normal mood and affect.   Assessment:  1. Hypertension, unspecified type     Plan:   Return in about 1 month (around 10/31/2018) for F/U: HTN- recehck BP, HLD- rehceck LIPIDS.  Orders Placed This Encounter  Procedures  . Basic metabolic panel    Standing Status:   Future    Number of Occurrences:   1    Standing Expiration Date:   10/01/2019    Requested Prescriptions    No prescriptions requested or ordered in this encounter

## 2018-09-30 NOTE — Patient Instructions (Signed)
Head downstairs for labs  Try to take your blood pressure pill every day to get the best control of your blood pressure   DASH Eating Plan DASH stands for "Dietary Approaches to Stop Hypertension." The DASH eating plan is a healthy eating plan that has been shown to reduce high blood pressure (hypertension). It may also reduce your risk for type 2 diabetes, heart disease, and stroke. The DASH eating plan may also help with weight loss. What are tips for following this plan?  General guidelines  Avoid eating more than 2,300 mg (milligrams) of salt (sodium) a day. If you have hypertension, you may need to reduce your sodium intake to 1,500 mg a day.  Limit alcohol intake to no more than 1 drink a day for nonpregnant women and 2 drinks a day for men. One drink equals 12 oz of beer, 5 oz of wine, or 1 oz of hard liquor.  Work with your health care provider to maintain a healthy body weight or to lose weight. Ask what an ideal weight is for you.  Get at least 30 minutes of exercise that causes your heart to beat faster (aerobic exercise) most days of the week. Activities may include walking, swimming, or biking.  Work with your health care provider or diet and nutrition specialist (dietitian) to adjust your eating plan to your individual calorie needs. Reading food labels   Check food labels for the amount of sodium per serving. Choose foods with less than 5 percent of the Daily Value of sodium. Generally, foods with less than 300 mg of sodium per serving fit into this eating plan.  To find whole grains, look for the word "whole" as the first word in the ingredient list. Shopping  Buy products labeled as "low-sodium" or "no salt added."  Buy fresh foods. Avoid canned foods and premade or frozen meals. Cooking  Avoid adding salt when cooking. Use salt-free seasonings or herbs instead of table salt or sea salt. Check with your health care provider or pharmacist before using salt  substitutes.  Do not fry foods. Cook foods using healthy methods such as baking, boiling, grilling, and broiling instead.  Cook with heart-healthy oils, such as olive, canola, soybean, or sunflower oil. Meal planning  Eat a balanced diet that includes: ? 5 or more servings of fruits and vegetables each day. At each meal, try to fill half of your plate with fruits and vegetables. ? Up to 6-8 servings of whole grains each day. ? Less than 6 oz of lean meat, poultry, or fish each day. A 3-oz serving of meat is about the same size as a deck of cards. One egg equals 1 oz. ? 2 servings of low-fat dairy each day. ? A serving of nuts, seeds, or beans 5 times each week. ? Heart-healthy fats. Healthy fats called Omega-3 fatty acids are found in foods such as flaxseeds and coldwater fish, like sardines, salmon, and mackerel.  Limit how much you eat of the following: ? Canned or prepackaged foods. ? Food that is high in trans fat, such as fried foods. ? Food that is high in saturated fat, such as fatty meat. ? Sweets, desserts, sugary drinks, and other foods with added sugar. ? Full-fat dairy products.  Do not salt foods before eating.  Try to eat at least 2 vegetarian meals each week.  Eat more home-cooked food and less restaurant, buffet, and fast food.  When eating at a restaurant, ask that your food be prepared with  less salt or no salt, if possible. What foods are recommended? The items listed may not be a complete list. Talk with your dietitian about what dietary choices are best for you. Grains Whole-grain or whole-wheat bread. Whole-grain or whole-wheat pasta. Brown rice. [place]. Bulgur. Whole-grain and low-sodium cereals. Pita bread. Low-fat, low-sodium crackers. Whole-wheat flour tortillas. Vegetables Fresh or frozen vegetables (raw, steamed, roasted, or grilled). Low-sodium or reduced-sodium tomato and vegetable juice. Low-sodium or reduced-sodium tomato sauce and tomato  paste. Low-sodium or reduced-sodium canned vegetables. Fruits All fresh, dried, or frozen fruit. Canned fruit in natural juice (without added sugar). Meat and other protein foods Skinless chicken or [place]. Ground chicken or [place]. Pork with fat trimmed off. Fish and seafood. Egg whites. Dried beans, peas, or lentils. Unsalted nuts, nut butters, and seeds. Unsalted canned beans. Lean cuts of beef with fat trimmed off. Low-sodium, lean deli meat. Dairy Low-fat (1%) or fat-free (skim) milk. Fat-free, low-fat, or reduced-fat cheeses. Nonfat, low-sodium ricotta or cottage cheese. Low-fat or nonfat yogurt. Low-fat, low-sodium cheese. Fats and oils Soft margarine without trans fats. Vegetable oil. Low-fat, reduced-fat, or light mayonnaise and salad dressings (reduced-sodium). Canola, safflower, olive, soybean, and sunflower oils. Avocado. Seasoning and other foods Herbs. Spices. Seasoning mixes without salt. Unsalted popcorn and pretzels. Fat-free sweets. What foods are not recommended? The items listed may not be a complete list. Talk with your dietitian about what dietary choices are best for you. Grains Baked goods made with fat, such as croissants, muffins, or some breads. Dry pasta or rice meal packs. Vegetables Creamed or fried vegetables. Vegetables in a cheese sauce. Regular canned vegetables (not low-sodium or reduced-sodium). Regular canned tomato sauce and paste (not low-sodium or reduced-sodium). Regular tomato and vegetable juice (not low-sodium or reduced-sodium). Angie Fava. Olives. Fruits Canned fruit in a light or heavy syrup. Fried fruit. Fruit in cream or butter sauce. Meat and other protein foods Fatty cuts of meat. Ribs. Fried meat. Berniece Salines. Sausage. Bologna and other processed lunch meats. Salami. Fatback. Hotdogs. Bratwurst. Salted nuts and seeds. Canned beans with added salt. Canned or smoked fish. Whole eggs or egg yolks. Chicken or [place] with skin. Dairy Whole or 2% milk,  cream, and half-and-half. Whole or full-fat cream cheese. Whole-fat or sweetened yogurt. Full-fat cheese. Nondairy creamers. Whipped toppings. Processed cheese and cheese spreads. Fats and oils Butter. Stick margarine. Lard. Shortening. Ghee. Bacon fat. Tropical oils, such as coconut, palm kernel, or palm oil. Seasoning and other foods Salted popcorn and pretzels. Onion salt, garlic salt, seasoned salt, table salt, and sea salt. Worcestershire sauce. Tartar sauce. Barbecue sauce. Teriyaki sauce. Soy sauce, including reduced-sodium. Steak sauce. Canned and packaged gravies. Fish sauce. Oyster sauce. Cocktail sauce. Horseradish that you find on the shelf. Ketchup. Mustard. Meat flavorings and tenderizers. Bouillon cubes. Hot sauce and Tabasco sauce. Premade or packaged marinades. Premade or packaged taco seasonings. Relishes. Regular salad dressings. Where to find more information:  National Heart, Lung, and [place]: https://wilson-eaton.com/  American Heart Association: www.heart.org Summary  The DASH eating plan is a healthy eating plan that has been shown to reduce high blood pressure (hypertension). It may also reduce your risk for type 2 diabetes, heart disease, and stroke.  With the DASH eating plan, you should limit salt (sodium) intake to 2,300 mg a day. If you have hypertension, you may need to reduce your sodium intake to 1,500 mg a day.  When on the DASH eating plan, aim to eat more fresh fruits and vegetables, whole grains, lean proteins,  low-fat dairy, and heart-healthy fats.  Work with your health care provider or diet and nutrition specialist (dietitian) to adjust your eating plan to your individual calorie needs. This information is not intended to replace advice given to you by your health care provider. Make sure you discuss any questions you have with your health care provider. Document Released: 09/17/2011 Document Revised: 09/21/2016 Document Reviewed: 09/21/2016 Elsevier  Interactive Patient Education  2019 [employer].

## 2019-02-02 ENCOUNTER — Other Ambulatory Visit: Payer: Self-pay

## 2019-02-02 ENCOUNTER — Ambulatory Visit (INDEPENDENT_AMBULATORY_CARE_PROVIDER_SITE_OTHER): Payer: Self-pay | Admitting: Nurse Practitioner

## 2019-02-02 VITALS — BP 135/98 | HR 72 | Temp 98.2°F | Resp 18 | Wt 168.0 lb

## 2019-02-02 DIAGNOSIS — R030 Elevated blood-pressure reading, without diagnosis of hypertension: Secondary | ICD-10-CM

## 2019-02-02 DIAGNOSIS — K047 Periapical abscess without sinus: Secondary | ICD-10-CM

## 2019-02-02 MED ORDER — AMOXICILLIN-POT CLAVULANATE 875-125 MG PO TABS: 1.0000 | ORAL_TABLET | Freq: Two times a day (BID) | ORAL | 0 refills | Status: AC

## 2019-02-02 MED FILL — AMOX-CLAV 875-125 MG TABLET: 875-125 | 7 days supply | Qty: 14 | Fill #0

## 2019-02-02 NOTE — Patient Instructions (Addendum)
Dental Abscess -Take medications as prescribed. -I would like for you to purchase Tylenol extra strength 500 mg.  I would like for you to take ibuprofen 800 mg every 8 hours to help with pain control.  You can take the Tylenol 1 tablet 1-2 hours after the Ibuprofen as needed for breakthrough pain.  Take these medications with food and water to protect the stomach lining. -Continue a soft diet until you are able to follow-up with the dentist. -Warm compresses to the right face until symptoms improve. -Gargle with warm salt water 3-4 times daily to help with dental pain or discomfort. -Follow-up in the emergency department if you develop fever, chills, worsening facial swelling, abdominal pain, or other concerns. -Take your blood pressure medication daily.  Follow up with your PCP for continued monitoring. -Keep your appointment with your dentist that you have.  I would check periodically to see if the dentist does have any cancellations in the event you may be able to obtain an appointment sooner.   A dental abscess is a collection of pus in or around a tooth that results from an infection. An abscess can cause pain in the affected area as well as other symptoms. Treatment is important to help with symptoms and to prevent the infection from spreading. What are the causes? This condition is caused by a bacterial infection around the root of the tooth that involves the inner part of the tooth (pulp). It may result from:  Severe tooth decay.  Trauma to the tooth, such as a broken or chipped tooth, that allows bacteria to enter into the pulp.  Severe gum disease around a tooth. What increases the risk? This condition is more likely to develop in males. It is also more likely to develop in people who:  Have dental decay (cavities).  Eat sugary snacks between meals.  Use tobacco products.  Have diabetes.  Have a weakened disease-fighting system (immune system).  Do not brush and care for  their teeth regularly. What are the signs or symptoms? Symptoms of this condition include:  Severe pain in and around the infected tooth.  Swelling and redness around the infected tooth, in the mouth, or in the face.  Tenderness.  Pus drainage.  Bad breath.  Bitter taste in the mouth.  Difficulty swallowing.  Difficulty opening the mouth.  Nausea.  Vomiting.  Chills.  Swollen neck glands.  Fever. How is this diagnosed? This condition is diagnosed based on:  Your symptoms and your medical and dental history.  An examination of the infected tooth. During the exam, your dentist may tap on the infected tooth. You may also have X-rays of the affected area. How is this treated? This condition is treated by getting rid of the infection. This may be done with:  Incision and drainage. This procedure is done by making an incision in the abscess to drain out the pus. Removing pus is the first priority in treating an abscess.  Antibiotic medicines. These may be used in certain situations.  Antibacterial mouth rinse.  A root canal. This may be performed to save the tooth. Your dentist accesses the visible part of your tooth (crown) with a drill and removes any damaged pulp. Then the space is filled and sealed off.  Tooth extraction. The tooth is pulled out if it cannot be saved by other treatment. You may also receive treatment for pain, such as:  Acetaminophen or NSAIDs.  Gels that contain a numbing medicine.  An injection to block  the pain near your nerve. Follow these instructions at home: Medicines  Take over-the-counter and prescription medicines only as told by your dentist.  If you were prescribed an antibiotic, take it as told by your dentist. Do not stop taking the antibiotic even if you start to feel better.  If you were prescribed a gel that contains a numbing medicine, use it exactly as told in the directions. Do not use these gels for children who are  younger than 47 years of age.  Do not drive or use heavy machinery while taking prescription pain medicine. General instructions  Rinse out your mouth often with salt water to relieve pain or swelling. To make a salt-water mixture, completely dissolve -1 tsp of salt in 1 cup of warm water.  Eat a soft diet while your abscess is healing.  Drink enough fluid to keep your urine pale yellow.  Do not apply heat to the outside of your mouth.  Do not use any products that contain nicotine or tobacco, such as cigarettes and e-cigarettes. If you need help quitting, ask your health care provider.  Keep all follow-up visits as told by your dentist. This is important. How is this prevented?  Brush your teeth every morning and night with fluoride toothpaste. Floss one time each day.  Get regularly scheduled dental cleanings.  Consider having a dental sealant applied on teeth that have deep holes (caries).  Drink fluoridated water regularly. This includes most tap water. Check the label on bottled water to see if it contains fluoride.  Drink water instead of sugary drinks.  Eat healthy meals and snacks.  Wear a mouth guard or face shield to protect your teeth while playing sports. Contact a health care provider if:  Your pain is worse and is not helped by medicine. Get help right away if:  You have a fever or chills.  Your symptoms suddenly get worse.  You have a very bad headache.  You have problems breathing or swallowing.  You have trouble opening your mouth.  You have swelling in your neck or around your eye. Summary  A dental abscess is a collection of pus in or around a tooth that results from an infection.  A dental abscess may result from severe tooth decay, trauma to the tooth, or severe gum disease around a tooth.  Symptoms include severe pain, swelling, redness, and drainage of pus in and around the infected tooth.  The first priority in treating a dental abscess  is to drain out the pus. Treatment may also involve removing damage inside the tooth (root canal) or pulling out (extracting) the tooth. This information is not intended to replace advice given to you by your health care provider. Make sure you discuss any questions you have with your health care provider. Document Released: 09/28/2005 Document Revised: 05/31/2017 Document Reviewed: 05/31/2017 Elsevier Interactive Patient Education  2019 [employer].

## 2019-02-02 NOTE — Progress Notes (Signed)
Subjective:    Patient ID: [name], female    DOB: 10/03/1961, 58 y.o.   MRN: [id number]  The patient is a 58 year old female who presents today for complaints of right lower dental pain.  Patient states her symptoms started about 2 to 3 days ago.  Patient states she began having facial swelling to her lower right jaw 1 day ago.  Patient denies fever, chills, malaise, sweats, abdominal pain, nausea, or vomiting.  Patient further denies any sinus pain, sinus pressure, or other upper respiratory symptoms.  Patient states that she has been taking ibuprofen for her pain.  Patient denies pain at this time.  The patient denies any recent dental follow-up or visits.  Patient informs that chewing hard foods, and eating cool liquids make her pain worse.  Patient states she has been eating grits, oatmeal, and other solid food since her symptoms started. Patient informed she applied an ice pack to her face earlier today which helped the swelling.   Dental Pain   This is a new problem. The current episode started in the past 7 days. The problem occurs every few hours. The problem has been waxing and waning. The pain is at a severity of 0/10. The patient is experiencing no pain. Associated symptoms include facial pain and thermal sensitivity. Pertinent negatives include no difficulty swallowing, fever or sinus pressure. Associated symptoms comments: + difficulty chewing, talking and cold intolerence. She has tried NSAIDs and ice for the symptoms. The treatment provided moderate relief.    Past Medical History:  Diagnosis Date  . Acute meniscal tear of left knee   . Complication of anesthesia 5/10   severe sore throat   . Fibroid   . Numbness    left knee  . OA (osteoarthritis) of knee    oa    Current Outpatient Medications:  .  ibuprofen (ADVIL) 200 MG tablet, Take 200 mg by mouth every 6 (six) hours as needed., Disp: , Rfl:  .  lisinopril (PRINIVIL,ZESTRIL) 10 MG tablet, Take 1 tablet (10 mg  total) by mouth daily., Disp: 30 tablet, Rfl: 1 .  nystatin (MYCOSTATIN/NYSTOP) powder, Apply topically 2 (two) times daily. (Patient not taking: Reported on 02/02/2019), Disp: 15 g, Rfl: 1  Review of Systems  Constitutional: Negative for chills, fatigue and fever.  HENT: Negative for congestion, ear discharge, ear pain, postnasal drip, rhinorrhea, sinus pressure, sinus pain and sore throat.   Eyes: Negative.   Respiratory: Negative.   Cardiovascular: Negative.   Gastrointestinal: Negative.   Neurological: Negative.       Objective: Blood pressure (!) 135/98, pulse 72, temperature 98.2 F (36.8 C), temperature source Oral, resp. rate 18, weight 168 lb (76.2 kg), SpO2 100 %.   Physical Exam Vitals signs reviewed.  Constitutional:      Comments: Appears uncomfortable due to facial swelling  HENT:     Head: Normocephalic.     Right Ear: Tympanic membrane, ear canal and external ear normal.     Left Ear: Tympanic membrane, ear canal and external ear normal.     Nose: Mucosal edema present.     Right Turbinates: Enlarged and swollen.     Left Turbinates: Enlarged and swollen.     Right Sinus: No maxillary sinus tenderness or frontal sinus tenderness.     Left Sinus: No maxillary sinus tenderness or frontal sinus tenderness.     Mouth/Throat:     Mouth: Mucous membranes are moist.     Pharynx: No oropharyngeal exudate  or posterior oropharyngeal erythema.  Neck:     Musculoskeletal: Normal range of motion and neck supple.  Cardiovascular:     Rate and Rhythm: Normal rate and regular rhythm.     Pulses: Normal pulses.     Heart sounds: Normal heart sounds.  Pulmonary:     Effort: Pulmonary effort is normal. No respiratory distress.     Breath sounds: Normal breath sounds. No stridor. No wheezing, rhonchi or rales.  Abdominal:     General: Bowel sounds are normal.     Palpations: Abdomen is soft.     Tenderness: There is no abdominal tenderness.  Lymphadenopathy:     Cervical: No  cervical adenopathy.  Skin:    General: Skin is warm and dry.     Capillary Refill: Capillary refill takes less than 2 seconds.  Neurological:     General: No focal deficit present.     Mental Status: She is alert and oriented to person, place, and time.  Psychiatric:        Mood and Affect: Mood normal.        Behavior: Behavior normal.       Assessment & Plan:   Exam findings, diagnosis etiology and medication use and indications reviewed with patient. Follow- Up and discharge instructions provided. No emergent/urgent issues found on exam.  The patient's physical exam did reveal facial swelling and severe tenderness.  There is definitely a concern for bacterial infection as patient has a poor dental history and with the acute onset of the facial swelling, pain and sensitivity to the affected area.  I am going to prescribe the patient Augmentin to cover her for a dental infection, and recommend that she follow-up with her scheduled dentist appointment.  The patient has not had fever, chills, or other systemic signs of infection at this time.  The patient's differential diagnoses include sinusitis, sialadenitis, and peritonsillar abscess.  The patient is well-appearing, is in no acute distress at this time.  I did discuss with the patient the elevated blood pressure reading in our office today.  Encouraged the patient that she should take her medications as directed.  I would like for her to follow-up with her PCP for continued monitoring of her blood pressure.  Patient education was provided. Patient verbalized understanding of information provided and agrees with plan of care (POC), all questions answered. The patient is advised to call or return to clinic if condition does not see an improvement in symptoms, or to seek the care of the closest emergency department if condition worsens with the above plan.   1. Dental abscess  - amoxicillin-clavulanate (AUGMENTIN) 875-125 MG tablet; Take 1 tablet  by mouth 2 (two) times daily for 7 days.  Dispense: 14 tablet; Refill: 0 -Take medications as prescribed. -I would like for you to purchase Tylenol extra strength 500 mg.  I would like for you to take ibuprofen 800 mg every 8 hours to help with pain control.  You can take the Tylenol 1 tablet 1-2 hours after the Ibuprofen as needed for breakthrough pain.  Take these medications with food and water to protect the stomach lining. -Continue a soft diet until you are able to follow-up with the dentist. -Warm compresses to the right face until symptoms improve. -Gargle with warm salt water 3-4 times daily to help with dental pain or discomfort. -Follow-up in the emergency department if you develop fever, chills, worsening facial swelling, abdominal pain, or other concerns. -Keep your appointment with your dentist  that you have.  I would check periodically to see if the dentist does have any cancellations in the event you may be able to obtain an appointment sooner.   2. Elevated blood pressure reading  -Take your blood pressure medication daily.  Follow up with your PCP for continued monitoring.

## 2019-02-06 ENCOUNTER — Telehealth: Payer: Self-pay

## 2019-02-06 NOTE — Telephone Encounter (Signed)
  Patient was not available.

## 2019-02-14 MED FILL — CLINDAMYCIN HCL 150 MG CAPS: 150 | 7 days supply | Qty: 56 | Fill #0

## 2019-04-17 ENCOUNTER — Other Ambulatory Visit: Payer: Self-pay | Admitting: Nurse Practitioner

## 2019-07-04 ENCOUNTER — Other Ambulatory Visit: Payer: Self-pay | Admitting: Internal Medicine

## 2019-07-04 DIAGNOSIS — Z1231 Encounter for screening mammogram for malignant neoplasm of breast: Secondary | ICD-10-CM

## 2019-07-21 ENCOUNTER — Ambulatory Visit: Payer: 59

## 2019-07-27 ENCOUNTER — Other Ambulatory Visit: Payer: Self-pay

## 2019-07-27 ENCOUNTER — Ambulatory Visit (INDEPENDENT_AMBULATORY_CARE_PROVIDER_SITE_OTHER)
Admission: RE | Admit: 2019-07-27 | Discharge: 2019-07-27 | Disposition: A | Discharge status: Home / Self Care | Payer: 59 | Source: Ambulatory Visit | Attending: Acute Care | Admitting: Acute Care

## 2019-07-27 DIAGNOSIS — Z87891 Personal history of nicotine dependence: Secondary | ICD-10-CM | POA: Diagnosis not present

## 2019-07-27 DIAGNOSIS — F17210 Nicotine dependence, cigarettes, uncomplicated: Secondary | ICD-10-CM

## 2019-07-27 DIAGNOSIS — Z122 Encounter for screening for malignant neoplasm of respiratory organs: Secondary | ICD-10-CM

## 2019-08-07 ENCOUNTER — Other Ambulatory Visit: Payer: Self-pay | Admitting: *Deleted

## 2019-08-07 ENCOUNTER — Encounter: Payer: Self-pay | Admitting: *Deleted

## 2019-08-07 DIAGNOSIS — F17210 Nicotine dependence, cigarettes, uncomplicated: Secondary | ICD-10-CM

## 2019-08-07 DIAGNOSIS — Z122 Encounter for screening for malignant neoplasm of respiratory organs: Secondary | ICD-10-CM

## 2019-08-15 ENCOUNTER — Telehealth: Payer: Self-pay

## 2019-08-15 ENCOUNTER — Encounter: Payer: Self-pay | Admitting: Family

## 2019-08-15 ENCOUNTER — Ambulatory Visit (INDEPENDENT_AMBULATORY_CARE_PROVIDER_SITE_OTHER): Payer: 59 | Admitting: Family

## 2019-08-15 ENCOUNTER — Other Ambulatory Visit: Payer: Self-pay

## 2019-08-15 VITALS — BP 122/78 | HR 77 | Temp 98.5°F | Ht 63.0 in | Wt 161.0 lb

## 2019-08-15 DIAGNOSIS — Z13220 Encounter for screening for lipoid disorders: Secondary | ICD-10-CM | POA: Diagnosis not present

## 2019-08-15 DIAGNOSIS — F17200 Nicotine dependence, unspecified, uncomplicated: Secondary | ICD-10-CM | POA: Diagnosis not present

## 2019-08-15 DIAGNOSIS — Z0000 Encounter for general adult medical examination without abnormal findings: Secondary | ICD-10-CM

## 2019-08-15 DIAGNOSIS — I7090 Unspecified atherosclerosis: Secondary | ICD-10-CM | POA: Diagnosis not present

## 2019-08-15 MED ORDER — FLUTICASONE PROPIONATE 50 MCG/ACT NA SUSP: 2.0000 | Freq: Every day | NASAL | 6 refills | Status: AC

## 2019-08-15 MED FILL — FLUTICASONE PROP 50 MCG SPR: 50 | 30 days supply | Qty: 16 | Fill #0

## 2019-08-15 NOTE — Patient Instructions (Addendum)
[facility] # [phone]      Steps to Quit Smoking Smoking tobacco is the leading cause of preventable death. It can affect almost every organ in the body. Smoking puts you and people around you at risk for many serious, long-lasting (chronic) diseases. Quitting smoking can be hard, but it is one of the best things that you can do for your health. It is never too late to quit. How do I get ready to quit? When you decide to quit smoking, make a plan to help you succeed. Before you quit:  Pick a date to quit. Set a date within the next 2 weeks to give you time to prepare.  Write down the reasons why you are quitting. Keep this list in places where you will see it often.  Tell your family, friends, and co-workers that you are quitting. Their support is important.  Talk with your doctor about the choices that may help you quit.  Find out if your health insurance will pay for these treatments.  Know the people, places, things, and activities that make you want to smoke (triggers). Avoid them. What first steps can I take to quit smoking?  Throw away all cigarettes at home, at work, and in your car.  Throw away the things that you use when you smoke, such as ashtrays and lighters.  Clean your car. Make sure to empty the ashtray.  Clean your home, including curtains and carpets. What can I do to help me quit smoking? Talk with your doctor about taking medicines and seeing a counselor at the same time. You are more likely to succeed when you do both.  If you are pregnant or breastfeeding, talk with your doctor about counseling or other ways to quit smoking. Do not take medicine to help you quit smoking unless your doctor tells you to do so. To quit smoking: Quit right away  Quit smoking totally, instead of slowly cutting back on how much you smoke over a period of time.  Go to counseling. You are more likely to quit if you go to counseling sessions regularly. Take  medicine You may take medicines to help you quit. Some medicines need a prescription, and some you can buy over-the-counter. Some medicines may contain a drug called nicotine to replace the nicotine in cigarettes. Medicines may:  Help you to stop having the desire to smoke (cravings).  Help to stop the problems that come when you stop smoking (withdrawal symptoms). Your doctor may ask you to use:  Nicotine patches, gum, or lozenges.  Nicotine inhalers or sprays.  Non-nicotine medicine that is taken by mouth. Find resources Find resources and other ways to help you quit smoking and remain smoke-free after you quit. These resources are most helpful when you use them often. They include:  Online chats with a Social worker.  Phone quitlines.  Printed Furniture conservator/restorer.  Support groups or group counseling.  Text messaging programs.  Mobile phone apps. Use apps on your mobile phone or tablet that can help you stick to your quit plan. There are many free apps for mobile phones and tablets as well as websites. Examples include Quit Guide from the [employer] and smokefree.gov  What things can I do to make it easier to quit?   Talk to your family and friends. Ask them to support and encourage you.  Call a phone quitline (1-800-QUIT-NOW), reach out to support groups, or work with a Social worker.  Ask people who smoke to  not smoke around you.  Avoid places that make you want to smoke, such as: ? Bars. ? Parties. ? Smoke-break areas at work.  Spend time with people who do not smoke.  Lower the stress in your life. Stress can make you want to smoke. Try these things to help your stress: ? Getting regular exercise. ? Doing deep-breathing exercises. ? Doing yoga. ? Meditating. ? Doing a body scan. To do this, close your eyes, focus on one area of your body at a time from head to toe. Notice which parts of your body are tense. Try to relax the muscles in those areas. How will I feel when I quit  smoking? Day 1 to 3 weeks Within the first 24 hours, you may start to have some problems that come from quitting tobacco. These problems are very bad 2-3 days after you quit, but they do not often last for more than 2-3 weeks. You may get these symptoms:  Mood swings.  Feeling restless, nervous, angry, or annoyed.  Trouble concentrating.  Dizziness.  Strong desire for high-sugar foods and nicotine.  Weight gain.  Trouble pooping (constipation).  Feeling like you may vomit (nausea).  Coughing or a sore throat.  Changes in how the medicines that you take for other issues work in your body.  Depression.  Trouble sleeping (insomnia). Week 3 and afterward After the first 2-3 weeks of quitting, you may start to notice more positive results, such as:  Better sense of smell and taste.  Less coughing and sore throat.  Slower heart rate.  Lower blood pressure.  Clearer skin.  Better breathing.  Fewer sick days. Quitting smoking can be hard. Do not give up if you fail the first time. Some people need to try a few times before they succeed. Do your best to stick to your quit plan, and talk with your doctor if you have any questions or concerns. Summary  Smoking tobacco is the leading cause of preventable death. Quitting smoking can be hard, but it is one of the best things that you can do for your health.  When you decide to quit smoking, make a plan to help you succeed.  Quit smoking right away, not slowly over a period of time.  When you start quitting, seek help from your doctor, family, or friends. This information is not intended to replace advice given to you by your health care provider. Make sure you discuss any questions you have with your health care provider. Document Released: 07/25/2009 Document Revised: 12/16/2018 Document Reviewed: 12/17/2018 Elsevier Patient Education  2020 [employer].

## 2019-08-15 NOTE — Progress Notes (Signed)
[name] is a 58 y.o. female with the following history as recorded in Epic[employer]:  Patient Active Problem List   Diagnosis Date Noted  . Smoker 09/01/2018  . Atherosclerosis 09/01/2018  . Hypertension 06/02/2018  . Encounter for general adult medical examination with abnormal findings 04/07/2018  . Obese 10/14/2016  . S/P left TKA 10/13/2016    Current Outpatient Medications  Medication Sig Dispense Refill  . ibuprofen (ADVIL) 200 MG tablet Take 200 mg by mouth every 6 (six) hours as needed.    . fluticasone (FLONASE) 50 MCG/ACT nasal spray Place 2 sprays into both nostrils daily. 16 g 6   No current facility-administered medications for this visit.     Allergies: Amlodipine, Losartan, and Tramadol  Past Medical History:  Diagnosis Date  . Acute meniscal tear of left knee   . Complication of anesthesia 5/10   severe sore throat   . Fibroid   . Numbness    left knee  . OA (osteoarthritis) of knee    oa    Past Surgical History:  Procedure Laterality Date  . CESAREAN SECTION     x 1  . CHONDROPLASTY Left 10/19/2014   Procedure:  LEFT MEDIAL PETELLA CHONDROPLASTY;  Surgeon: [name], MD;  Location: [facility];  Service: Orthopedics;  Laterality: Left;  . DILATION AND CURETTAGE OF UTERUS  1996  . HYSTEROSCOPY W/D&C  05-11-2000  . KNEE ARTHROSCOPY Left 10/19/2014   Procedure: LEFT ARTHROSCOPY KNEE WITH DEBRIEDMENT;  Surgeon: [name], MD;  Location: [facility];  Service: Orthopedics;  Laterality: Left;  . KNEE ARTHROSCOPY WITH LATERAL MENISECTOMY Left 10/19/2014   Procedure: KNEE ARTHROSCOPY WITH LATERAL MENISECTOMY;  Surgeon: [name], MD;  Location: [facility];  Service: Orthopedics;  Laterality: Left;  . ROBOTIC ASSISTED TOTAL HYSTERECTOMY  02/26/2009   partial  . TOTAL KNEE ARTHROPLASTY Right 05-26-2010  . TOTAL KNEE ARTHROPLASTY Left 10/13/2016   Procedure: LEFT TOTAL KNEE ARTHROPLASTY;  Surgeon: Paralee Cancel,  MD;  Location: [facility];  Service: Orthopedics;  Laterality: Left;  . TUBAL LIGATION      Family History  Problem Relation Age of Onset  . Alcohol abuse Mother   . Cancer Father     Social History   Tobacco Use  . Smoking status: Current Every Day Smoker    Packs/day: 1.25    Years: 36.00    Pack years: 45.00    Types: Cigarettes  . Smokeless tobacco: Never Used  Substance Use Topics  . Alcohol use: Yes    Alcohol/week: 2.0 - 3.0 standard drinks    Types: 2 - 3 Cans of beer per week    Subjective:  Patient presents for yearly CPE; transitioning care from provider who has left the practice; notes she has not taken blood pressure medication in almost 6 months; has her blood pressure checked at her job and is normal; Denies any chest pain, shortness of breath, blurred vision or headache Does have GYN; not interested in quitting smoking; defers colonoscopy but agreeable to Cologuard; Not fasting today- prefers to come back for labs at later date;   Health Maintenance  Topic Date Due  . COLONOSCOPY  08/14/2020 (Originally 09/10/2011)  . MAMMOGRAM  04/22/2020  . TETANUS/TDAP  04/07/2028  . INFLUENZA VACCINE  Completed  . Hepatitis C Screening  Completed  . HIV Screening  Completed  . PAP SMEAR-Modifier  Discontinued    Review of Systems  Constitutional: Negative.  HENT: Negative.   Eyes: Negative.   Respiratory: Negative.   Cardiovascular: Negative.   Gastrointestinal: Negative.   Genitourinary: Negative.   Musculoskeletal: Negative.   Skin: Negative.   Neurological: Negative.   Endo/Heme/Allergies: Negative.   Psychiatric/Behavioral: Negative.         Objective:  Vitals:   08/15/19 1043  BP: 122/78  Pulse: 77  Temp: 98.5 F (36.9 C)  TempSrc: Oral  SpO2: 98%  Weight: 161 lb (73 kg)  Height: _0  (1.6 m)    General: Well developed, well nourished, in no acute distress  Skin : Warm and dry.  Head: Normocephalic and atraumatic  Eyes: Sclera and  conjunctiva clear; pupils round and reactive to light; extraocular movements intact  Ears: External normal; canals clear; tympanic membranes normal  Oropharynx: Pink, supple. No suspicious lesions  Neck: Supple without thyromegaly, adenopathy  Lungs: Respirations unlabored; clear to auscultation bilaterally without wheeze, rales, rhonchi  CVS exam: normal rate and regular rhythm.  Abdomen: Soft; nontender; nondistended; normoactive bowel sounds; no masses or hepatosplenomegaly  Musculoskeletal: No deformities; no active joint inflammation  Extremities: No edema, cyanosis, clubbing  Vessels: Symmetric bilaterally  Neurologic: Alert and oriented; speech intact; face symmetrical; moves all extremities well; CNII-XII intact without focal deficit   Assessment:  1. PE (physical exam), annual   2. Lipid screening   3. Smoker   4. Atherosclerosis     Plan:  Age appropriate preventive healthcare needs addressed; encouraged regular eye doctor and dental exams; encouraged regular exercise and weight loss; will update labs and refills as needed today; follow-up to be determined; Patient notes she is not interested in quitting smoking- encouraged to try cutting back at least; reviewed recent CT lung cancer screen- patient agrees to trial of statin; will make decision based on lab results;  Patient defers colonoscopy but agrees to Cologuard- order updated;    No follow-ups on file.  Orders Placed This Encounter  Procedures  . CBC w/Diff    Standing Status:   Future    Number of Occurrences:   1    Standing Expiration Date:   08/14/2020  . Comp Met (CMET)    Standing Status:   Future    Number of Occurrences:   1    Standing Expiration Date:   08/14/2020  . Lipid panel    Standing Status:   Future    Number of Occurrences:   1    Standing Expiration Date:   08/14/2020  . TSH    Standing Status:   Future    Number of Occurrences:   1    Standing Expiration Date:   08/14/2020  . Vitamin D (25  hydroxy)    Standing Status:   Future    Number of Occurrences:   1    Standing Expiration Date:   08/14/2020    Requested Prescriptions   Signed Prescriptions Disp Refills  . fluticasone (FLONASE) 50 MCG/ACT nasal spray 16 g 6    Sig: Place 2 sprays into both nostrils daily.

## 2019-08-16 ENCOUNTER — Other Ambulatory Visit: Payer: Self-pay

## 2019-08-16 ENCOUNTER — Other Ambulatory Visit (INDEPENDENT_AMBULATORY_CARE_PROVIDER_SITE_OTHER): Payer: 59

## 2019-08-16 DIAGNOSIS — Z0000 Encounter for general adult medical examination without abnormal findings: Secondary | ICD-10-CM | POA: Diagnosis not present

## 2019-08-16 DIAGNOSIS — Z13220 Encounter for screening for lipoid disorders: Secondary | ICD-10-CM | POA: Diagnosis not present

## 2019-08-16 DIAGNOSIS — Z1211 Encounter for screening for malignant neoplasm of colon: Secondary | ICD-10-CM

## 2019-08-16 LAB — COMPREHENSIVE METABOLIC PANEL
ALT: 48 U/L — ABNORMAL HIGH (ref 0–35)
AST: 69 U/L — ABNORMAL HIGH (ref 0–37)
Albumin: 4.6 g/dL (ref 3.5–5.2)
Alkaline Phosphatase: 86 U/L (ref 39–117)
BUN: 10 mg/dL (ref 6–23)
CO2: 25 mEq/L (ref 19–32)
Calcium: 9.3 mg/dL (ref 8.4–10.5)
Chloride: 106 mEq/L (ref 96–112)
Creatinine, Ser: 0.61 mg/dL (ref 0.40–1.20)
GFR: 121.92 mL/min (ref >60.00)
Glucose, Bld: 85 mg/dL (ref 70–99)
Potassium: 4.2 mEq/L (ref 3.5–5.1)
Sodium: 139 mEq/L (ref 135–145)
Total Bilirubin: 0.5 mg/dL (ref 0.2–1.2)
Total Protein: 7.7 g/dL (ref 6.0–8.3)

## 2019-08-16 LAB — CBC WITH DIFFERENTIAL/PLATELET
Basophils Absolute: 0 10*3/uL (ref 0.0–0.1)
Basophils Relative: 0.6 % (ref 0.0–3.0)
Eosinophils Absolute: 0 10*3/uL (ref 0.0–0.7)
Eosinophils Relative: 0.3 % (ref 0.0–5.0)
HCT: 39.7 % (ref 36.0–46.0)
Hemoglobin: 13.3 g/dL (ref 12.0–15.0)
Lymphocytes Relative: 28.8 % (ref 12.0–46.0)
Lymphs Abs: 1.3 10*3/uL (ref 0.7–4.0)
MCHC: 33.6 g/dL (ref 30.0–36.0)
MCV: 104.3 fl — ABNORMAL HIGH (ref 78.0–100.0)
Monocytes Absolute: 0.4 10*3/uL (ref 0.1–1.0)
Monocytes Relative: 9.5 % (ref 3.0–12.0)
Neutro Abs: 2.8 10*3/uL (ref 1.4–7.7)
Neutrophils Relative %: 60.8 % (ref 43.0–77.0)
Platelets: 196 10*3/uL (ref 150.0–400.0)
RBC: 3.8 Mil/uL — ABNORMAL LOW (ref 3.87–5.11)
RDW: 12.5 % (ref 11.5–15.5)
WBC: 4.6 10*3/uL (ref 4.0–10.5)

## 2019-08-16 LAB — LIPID PANEL
Cholesterol: 204 mg/dL — ABNORMAL HIGH (ref 0–200)
HDL: 68.5 mg/dL (ref >39.00)
LDL Cholesterol: 116 mg/dL — ABNORMAL HIGH (ref 0–99)
NonHDL: 135.34
Total CHOL/HDL Ratio: 3
Triglycerides: 97 mg/dL (ref 0.0–149.0)
VLDL: 19.4 mg/dL (ref 0.0–40.0)

## 2019-08-16 LAB — TSH: TSH: 0.44 u[IU]/mL (ref 0.35–4.50)

## 2019-08-16 LAB — VITAMIN D 25 HYDROXY (VIT D DEFICIENCY, FRACTURES): VITD: 19.93 ng/mL — ABNORMAL LOW (ref 30.00–100.00)

## 2019-08-16 NOTE — Telephone Encounter (Signed)
Cologuard ordered today °

## 2019-08-17 ENCOUNTER — Ambulatory Visit: Payer: 59

## 2019-08-17 ENCOUNTER — Other Ambulatory Visit: Payer: Self-pay | Admitting: Family

## 2019-08-17 DIAGNOSIS — R7989 Other specified abnormal findings of blood chemistry: Secondary | ICD-10-CM

## 2019-08-17 DIAGNOSIS — R945 Abnormal results of liver function studies: Secondary | ICD-10-CM

## 2019-08-17 MED ORDER — VITAMIN D (ERGOCALCIFEROL) 1.25 MG (50000 UNIT) PO CAPS: 50000.0000 [IU] | ORAL_CAPSULE | ORAL | 0 refills | Status: AC

## 2019-08-17 MED FILL — VIT D2 1.25 MG (50,000 UNIT: 1.25 MG | 84 days supply | Qty: 12 | Fill #0

## 2019-10-03 ENCOUNTER — Ambulatory Visit
Admission: RE | Admit: 2019-10-03 | Discharge: 2019-10-03 | Disposition: A | Discharge status: Home / Self Care | Payer: 59 | Source: Ambulatory Visit | Attending: Internal Medicine | Admitting: Internal Medicine

## 2019-10-03 ENCOUNTER — Other Ambulatory Visit: Payer: Self-pay

## 2019-10-03 DIAGNOSIS — Z1231 Encounter for screening mammogram for malignant neoplasm of breast: Secondary | ICD-10-CM | POA: Diagnosis not present

## 2019-12-04 DIAGNOSIS — H43391 Other vitreous opacities, right eye: Secondary | ICD-10-CM | POA: Diagnosis not present

## 2020-04-01 ENCOUNTER — Telehealth: Payer: Self-pay | Admitting: Acute Care

## 2020-04-02 NOTE — Telephone Encounter (Signed)
ATC- Unable to leave message- Will call back °

## 2020-04-04 NOTE — Telephone Encounter (Signed)
ATC but unable to leave message. I do not see where we have tried to call pt recently. Will close this message .

## 2020-05-29 ENCOUNTER — Ambulatory Visit: Payer: 59 | Admitting: Acute Care

## 2020-06-24 ENCOUNTER — Other Ambulatory Visit: Payer: Self-pay

## 2020-06-24 ENCOUNTER — Encounter: Payer: 59 | Admitting: Acute Care

## 2020-06-24 NOTE — Progress Notes (Signed)
 SABRA

## 2020-07-15 ENCOUNTER — Ambulatory Visit: Payer: 59 | Admitting: Acute Care

## 2020-07-22 ENCOUNTER — Encounter: Payer: Self-pay | Admitting: Acute Care

## 2020-10-10 ENCOUNTER — Other Ambulatory Visit: Payer: Self-pay

## 2020-10-10 ENCOUNTER — Inpatient Hospital Stay: Payer: 59 | Attending: Oncology

## 2020-10-10 DIAGNOSIS — Z23 Encounter for immunization: Secondary | ICD-10-CM | POA: Insufficient documentation

## 2020-10-10 NOTE — Progress Notes (Signed)
   Covid-19 Vaccination Clinic  Name:  [name]    MRN: [id number] DOB: [birth year]  10/10/2020  [name] was observed post Covid-19 immunization for 15 minutes without incident. She was provided with Vaccine Information Sheet and instruction to access the V-Safe system.   [name] was instructed to call 911 with any severe reactions post vaccine: Marland Kitchen Difficulty breathing  . Swelling of face and throat  . A fast heartbeat  . A bad rash all over body  . Dizziness and weakness   Immunizations Administered    Name Date Dose VIS Date Route   Pfizer COVID-19 Vaccine 10/10/2020  2:05 PM 0.3 mL 07/31/2020 Intramuscular   Manufacturer: [employer], [employer]   Lot: [id number]   NDC: [address]-9

## 2020-11-06 ENCOUNTER — Other Ambulatory Visit: Payer: Self-pay | Admitting: Internal Medicine

## 2020-11-06 DIAGNOSIS — Z1231 Encounter for screening mammogram for malignant neoplasm of breast: Secondary | ICD-10-CM

## 2020-12-20 ENCOUNTER — Inpatient Hospital Stay: Admission: RE | Admit: 2020-12-20 | Payer: 59 | Source: Ambulatory Visit

## 2021-09-22 IMAGING — CT CT CHEST LUNG CANCER SCREENING LOW DOSE W/O CM
1 series · 10 of 10 positions shown, 13 images · non-contrast
Comparison: Low-dose lung cancer screening chest CT 07/07/2018.

CLINICAL DATA: 57-year-old female current smoker with 46 pack year
history of smoking. Lung cancer screening examination.

EXAM:
CT CHEST WITHOUT CONTRAST LOW-DOSE FOR LUNG CANCER SCREENING
TECHNIQUE: Multidetector CT imaging of the chest was performed following the
standard protocol without IV contrast.

[ct lung segmentation data · axial · 0.69mm/px · z∈[-321,-321]mm · 10 of 290 frames shown]
[frame 1/290  mediastinal]
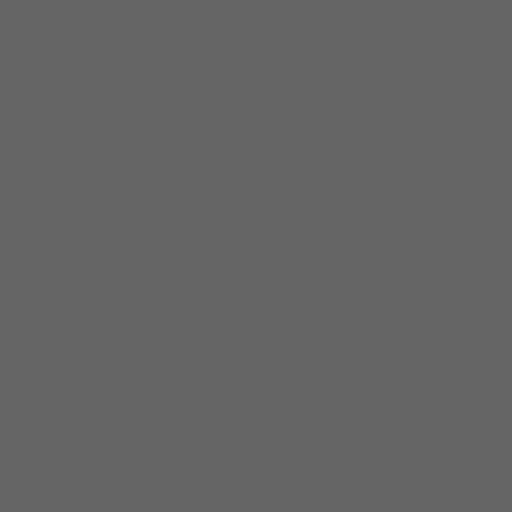
[frame 1/290  lung]
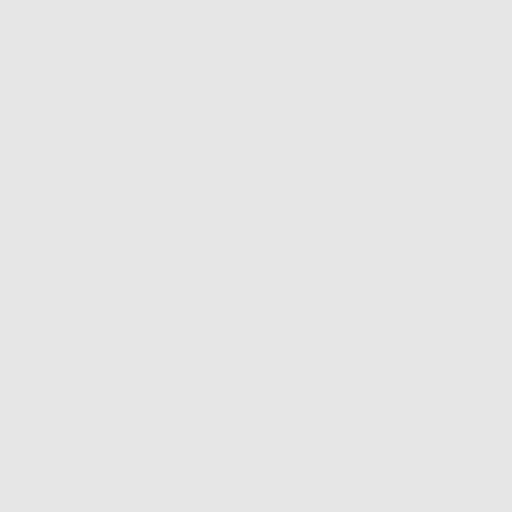
[frame 33/290  lung]
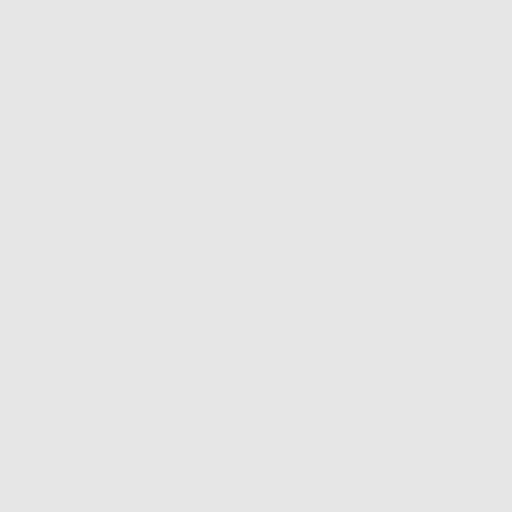
[frame 65/290  lung]
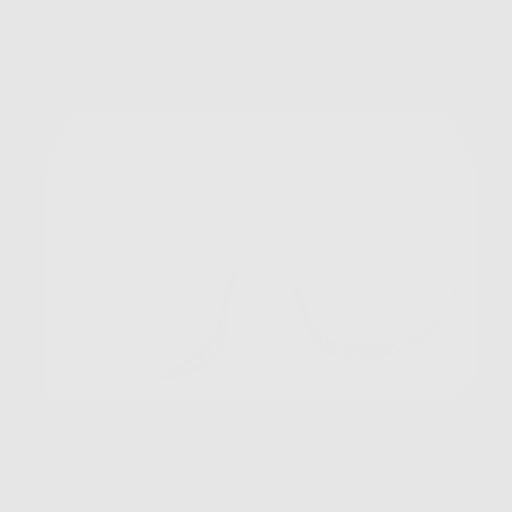
[frame 97/290  lung]
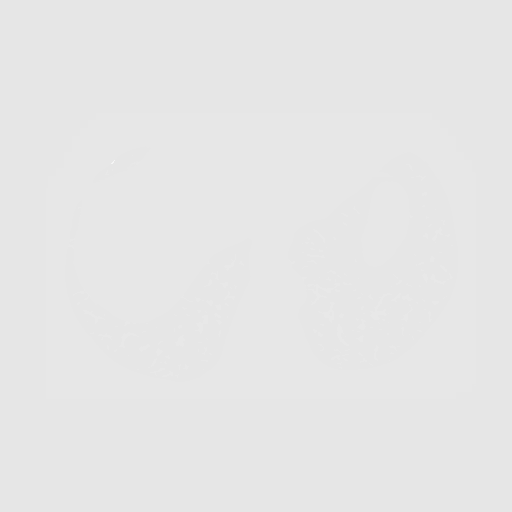
[frame 129/290  mediastinal]
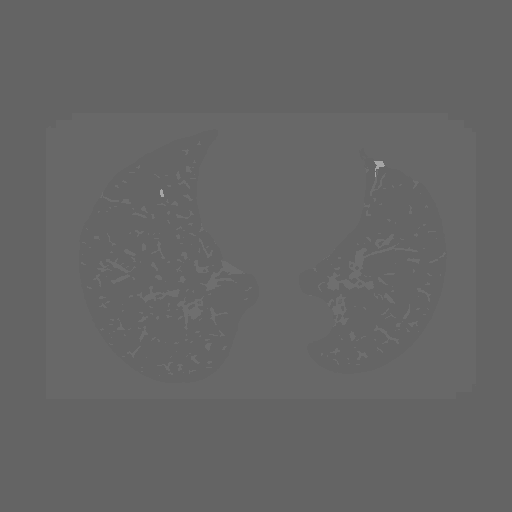
[frame 129/290  lung]
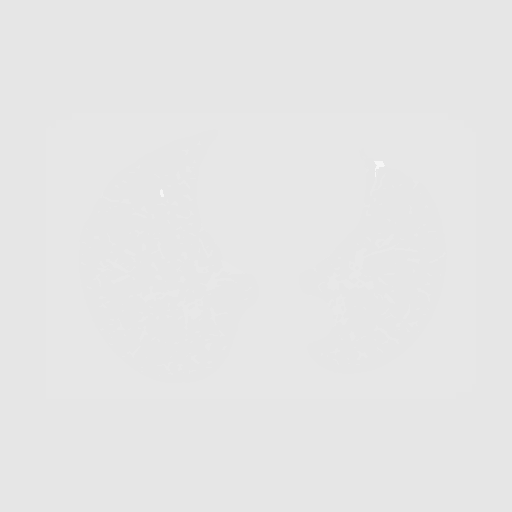
[frame 161/290  lung]
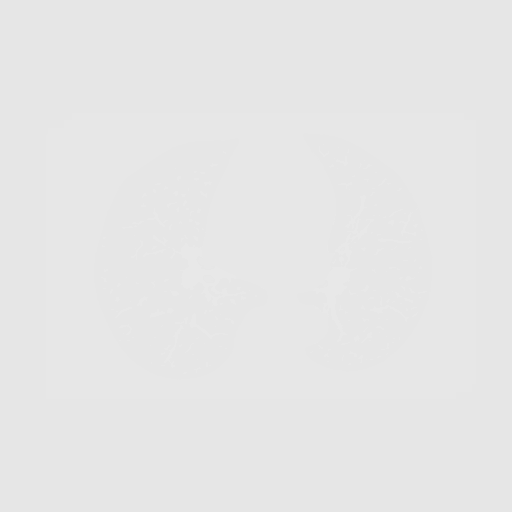
[frame 193/290  lung]
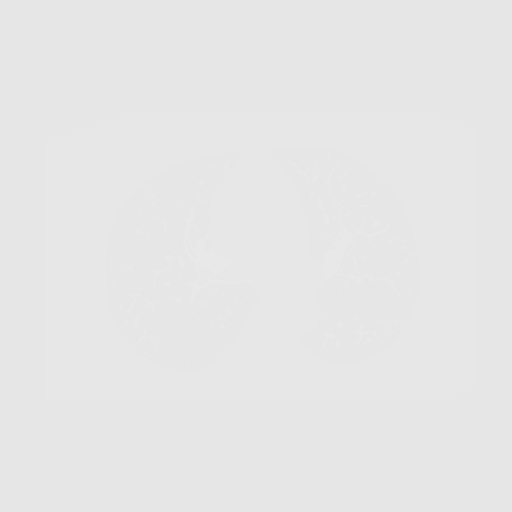
[frame 225/290  lung]
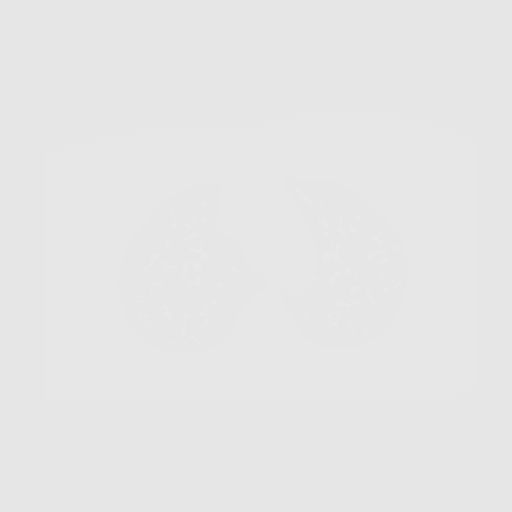
[frame 257/290  mediastinal]
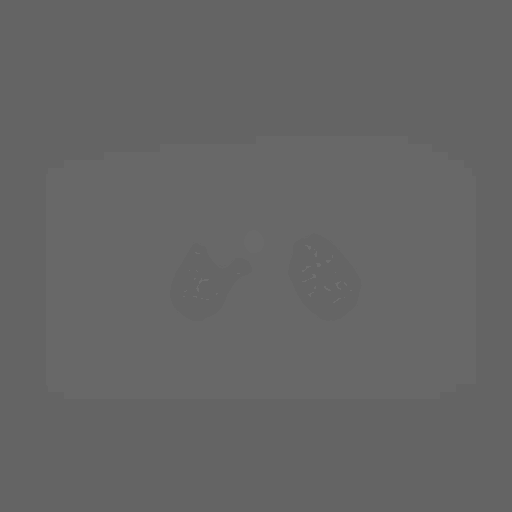
[frame 257/290  lung]
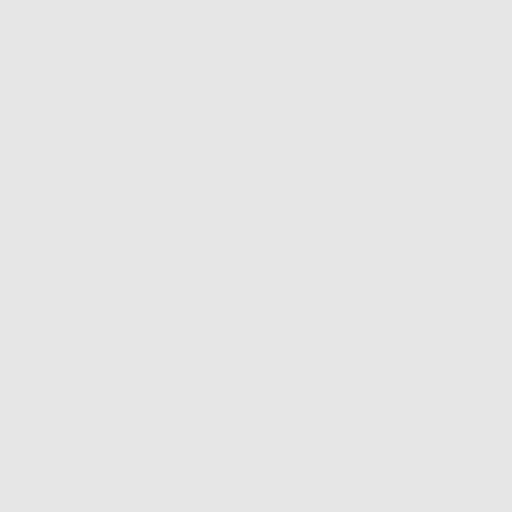
[frame 290/290  lung]
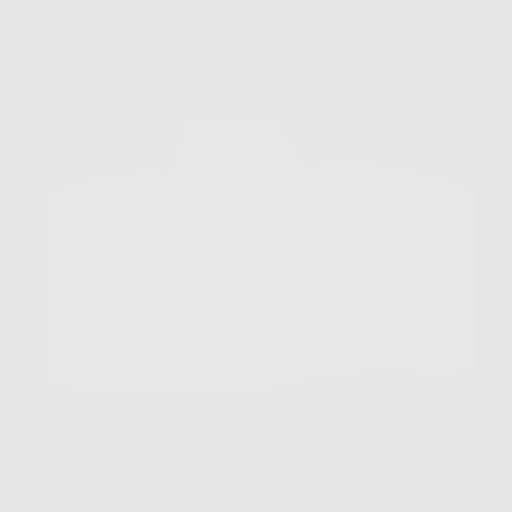

[10 of 10 positions shown; findings below may reference images not displayed]

FINDINGS: Cardiovascular: Heart size is normal. There is no significant
pericardial fluid, thickening or pericardial calcification. There is
aortic atherosclerosis, as well as atherosclerosis of the great
vessels of the mediastinum and the coronary arteries, including
calcified atherosclerotic plaque in the left main and left anterior
descending coronary arteries.

Mediastinum/Nodes: No pathologically enlarged mediastinal or hilar
lymph nodes. Please note that accurate exclusion of hilar adenopathy
is limited on noncontrast CT scans. Esophagus is unremarkable in
appearance. No axillary lymphadenopathy.

Lungs/Pleura: Several small calcified granulomas are again noted. No
other suspicious appearing pulmonary nodules or masses are
identified. No acute consolidative airspace disease. No pleural
effusions. Mild diffuse bronchial wall thickening with mild
centrilobular and paraseptal emphysema.

Upper Abdomen: Diffuse low attenuation throughout the visualized
hepatic parenchyma, indicative of hepatic steatosis. Aortic
atherosclerosis.

Musculoskeletal: There are no aggressive appearing lytic or blastic
lesions noted in the visualized portions of the skeleton.
IMPRESSION: 1. Lung-RADS 1S, negative. Continue annual screening with low-dose
chest CT without contrast in 12 months.
2. The "S" modifier above refers to potentially clinically
significant non lung cancer related findings. Specifically, there is
aortic atherosclerosis, in addition to left main and left anterior
descending coronary artery disease. Please note that although the
presence of coronary artery calcium documents the presence of
coronary artery disease, the severity of this disease and any
potential stenosis cannot be assessed on this non-gated CT
examination. Assessment for potential risk factor modification,
dietary therapy or pharmacologic therapy may be warranted, if
clinically indicated.
3. Mild diffuse bronchial wall thickening with mild centrilobular
and paraseptal emphysema; imaging findings suggestive of underlying
COPD.
4. Hepatic steatosis.

Aortic Atherosclerosis (GRT4S-Y33.3) and Emphysema (GRT4S-O6C.0).

## 2022-04-16 ENCOUNTER — Ambulatory Visit (INDEPENDENT_AMBULATORY_CARE_PROVIDER_SITE_OTHER): Payer: 59

## 2022-04-16 ENCOUNTER — Other Ambulatory Visit (HOSPITAL_COMMUNITY): Payer: Self-pay

## 2022-04-16 ENCOUNTER — Ambulatory Visit
Admission: EM | Admit: 2022-04-16 | Discharge: 2022-04-16 | Disposition: A | Discharge status: Home / Self Care | Payer: 59 | Attending: Emergency Medicine | Admitting: Emergency Medicine

## 2022-04-16 DIAGNOSIS — K5904 Chronic idiopathic constipation: Secondary | ICD-10-CM | POA: Diagnosis not present

## 2022-04-16 DIAGNOSIS — R109 Unspecified abdominal pain: Secondary | ICD-10-CM | POA: Diagnosis not present

## 2022-04-16 MED ORDER — LINACLOTIDE 145 MCG PO CAPS
145.0000 ug | ORAL_CAPSULE | Freq: Every day | ORAL | 0 refills | Status: AC
Start: 2022-04-16 — End: 2022-05-16
  Filled 2022-04-16: qty 30, 30d supply, fill #0

## 2022-04-16 NOTE — ED Triage Notes (Signed)
Pt c/o abd pain and vomiting that began last night after eating an orange pepper.   Home interventions: none

## 2022-04-16 NOTE — ED Provider Notes (Signed)
[facility]    CSN: [id number] Arrival date & time: 04/16/22  1241    HISTORY   Chief Complaint  Patient presents with   Abdominal Pain   HPI [name] is a 61 y.o. female. Pt c/o abd pain and vomiting that began last night after eating an orange pepper.  Patient states she attempted to take Pepto-Bismol after dinner however this caused her to vomit 3 times in the early hours of this morning starting around 3 AM.  Blood pressure significantly elevated on arrival today, patient reports being in his considerable amount of pain, patient endorses history of hypertension not currently taking medications because she does not like to take medication every day.  Patient continues to smoke cigarettes daily..  Patient has that she has not had diarrhea and feels that if she can have a bowel movement she will feel better.  EMR reviewed, patient does have a history of constipation and abdominal pain, last documented episode in 2018.  The history is provided by the patient.   Past Medical History:  Diagnosis Date   Acute meniscal tear of left knee    Complication of anesthesia 5/10   severe sore throat    Fibroid    Numbness    left knee   OA (osteoarthritis) of knee    oa   Patient Active Problem List   Diagnosis Date Noted   Smoker 09/01/2018   Atherosclerosis 09/01/2018   Hypertension 06/02/2018   Encounter for general adult medical examination with abnormal findings 04/07/2018   Obese 10/14/2016   S/P left TKA 10/13/2016   Past Surgical History:  Procedure Laterality Date   CESAREAN SECTION     x 1   CHONDROPLASTY Left 10/19/2014   Procedure:  LEFT MEDIAL PETELLA CHONDROPLASTY;  Surgeon: [name], MD;  Location: [facility];  Service: Orthopedics;  Laterality: Left;   DILATION AND CURETTAGE OF UTERUS  1996   HYSTEROSCOPY WITH D & C  05-11-2000   KNEE ARTHROSCOPY Left 10/19/2014   Procedure: LEFT ARTHROSCOPY KNEE WITH DEBRIEDMENT;  Surgeon: [name], MD;  Location: [facility];  Service: Orthopedics;  Laterality: Left;   KNEE ARTHROSCOPY WITH LATERAL MENISECTOMY Left 10/19/2014   Procedure: KNEE ARTHROSCOPY WITH LATERAL MENISECTOMY;  Surgeon: [name], MD;  Location: [facility];  Service: Orthopedics;  Laterality: Left;   ROBOTIC ASSISTED TOTAL HYSTERECTOMY  02/26/2009   partial   TOTAL KNEE ARTHROPLASTY Right 05-26-2010   TOTAL KNEE ARTHROPLASTY Left 10/13/2016   Procedure: LEFT TOTAL KNEE ARTHROPLASTY;  Surgeon: Paralee Cancel, MD;  Location: [facility];  Service: Orthopedics;  Laterality: Left;   TUBAL LIGATION     OB History     Gravida  2   Para  2   Term  2   Preterm  0   AB  0   Living  0      SAB  0   IAB  0   Ectopic  0   Multiple  0   Live Births  2          Home Medications    Prior to Admission medications   Medication Sig Start Date End Date Taking? Authorizing Provider  fluticasone (FLONASE) 50 MCG/ACT nasal spray Place 2 sprays into both nostrils daily. 08/15/19   Marrian Salvage, FNP  ibuprofen (ADVIL) 200 MG tablet Take 200 mg by mouth every 6 (six) hours as needed.    [provider]    Family History Family History  Problem Relation Age of Onset   Alcohol abuse Mother    Cancer Father    Social History Social History   Tobacco Use   Smoking status: Every Day    Packs/day: 1.25    Years: 36.00    Total pack years: 45.00    Types: Cigarettes   Smokeless tobacco: Never  Substance Use Topics   Alcohol use: Yes    Alcohol/week: 2.0 - 3.0 standard drinks of alcohol    Types: 2 - 3 Cans of beer per week   Drug use: No   Allergies   Amlodipine, Losartan, and Tramadol  Review of Systems Review of Systems Pertinent findings noted in history of present illness.   Physical Exam Triage Vital Signs ED Triage Vitals  Enc Vitals Group     BP 08/08/21 0827 (!) 147/82     Pulse Rate 08/08/21 0827 72     Resp 08/08/21 0827 18      Temp 08/08/21 0827 98.3 F (36.8 C)     Temp Source 08/08/21 0827 Oral     SpO2 08/08/21 0827 98 %     Weight --      Height --      Head Circumference --      Peak Flow --      Pain Score 08/08/21 0826 5     Pain Loc --      Pain Edu? --      Excl. in [place]? --   No data found.  Updated Vital Signs BP (!) 168/117 (BP Location: Right Arm) Comment: proider made aware  Pulse 94   Temp 98.3 F (36.8 C) (Oral)   Resp 20   SpO2 97%   Physical Exam Vitals and nursing note reviewed.  Constitutional:      General: She is in acute distress.     Appearance: Normal appearance. She is well-developed. She is obese. She is ill-appearing. She is not toxic-appearing or diaphoretic.  HENT:     Head: Normocephalic and atraumatic.  Eyes:     General: Lids are normal.        Right eye: No discharge.        Left eye: No discharge.     Extraocular Movements: Extraocular movements intact.     Conjunctiva/sclera: Conjunctivae normal.     Right eye: Right conjunctiva is not injected.     Left eye: Left conjunctiva is not injected.  Neck:     Trachea: Trachea and phonation normal.  Cardiovascular:     Rate and Rhythm: Normal rate and regular rhythm.     Pulses: Normal pulses.     Heart sounds: Normal heart sounds. No murmur heard.    No friction rub. No gallop.  Pulmonary:     Effort: Pulmonary effort is normal. No accessory muscle usage, prolonged expiration or respiratory distress.     Breath sounds: Normal breath sounds. No stridor, decreased air movement or transmitted upper airway sounds. No decreased breath sounds, wheezing, rhonchi or rales.  Chest:     Chest wall: No tenderness.  Abdominal:     General: Abdomen is flat. Bowel sounds are decreased.     Tenderness: There is generalized abdominal tenderness. There is guarding. There is no right CVA tenderness, left CVA tenderness or rebound. Negative signs include Murphy's sign, Rovsing's sign, McBurney's sign, psoas sign and obturator  sign.     Hernia: No hernia is present.  Musculoskeletal:  General: Normal range of motion.     Cervical back: Normal range of motion and neck supple. Normal range of motion.  Lymphadenopathy:     Cervical: No cervical adenopathy.  Skin:    General: Skin is warm and dry.     Findings: No erythema or rash.  Neurological:     General: No focal deficit present.     Mental Status: She is alert and oriented to person, place, and time.  Psychiatric:        Mood and Affect: Mood normal.        Behavior: Behavior normal.     Visual Acuity Right Eye Distance:   Left Eye Distance:   Bilateral Distance:    Right Eye Near:   Left Eye Near:    Bilateral Near:     UC Couse / Diagnostics / Procedures:    EKG  Radiology No results found.  Procedures Procedures (including critical care time)  UC Diagnoses / Final Clinical Impressions(s)   I have reviewed the triage vital signs and the nursing notes.  Pertinent labs & imaging results that were available during my care of the patient were reviewed by me and considered in my medical decision making (see chart for details).    Final diagnoses:  Chronic idiopathic constipation   Patient provided with a prescription for Linzess for intermittent bouts of constipation such as the one she is having at this time.  Patient advised to follow-up with PCP regarding this chronic condition.  ED Prescriptions     Medication Sig Dispense Auth. Provider   linaclotide (LINZESS) 145 MCG CAPS capsule Take 1 capsule (145 mcg total) by mouth daily before breakfast. 30 capsule Lynden Oxford Scales, PA-C      PDMP not reviewed this encounter.  Pending results:  Labs Reviewed - No data to display  Medications Ordered in UC: Medications - No data to display  Disposition Upon Discharge:  Condition: stable for discharge home Home: take medications as prescribed; routine discharge instructions as discussed; follow up as advised.  Patient  presented with an acute illness with associated systemic symptoms and significant discomfort requiring urgent management. In my opinion, this is a condition that a prudent lay person (someone who possesses an average knowledge of health and medicine) may potentially expect to result in complications if not addressed urgently such as respiratory distress, impairment of bodily function or dysfunction of bodily organs.   Routine symptom specific, illness specific and/or disease specific instructions were discussed with the patient and/or caregiver at length.   As such, the patient has been evaluated and assessed, work-up was performed and treatment was provided in alignment with urgent care protocols and evidence based medicine.  Patient/parent/caregiver has been advised that the patient may require follow up for further testing and treatment if the symptoms continue in spite of treatment, as clinically indicated and appropriate.  Patient/parent/caregiver has been advised to return to the [facility] or PCP if no better; to PCP or the Emergency Department if new signs and symptoms develop, or if the current signs or symptoms continue to change or worsen for further workup, evaluation and treatment as clinically indicated and appropriate  The patient will follow up with their current PCP if and as advised. If the patient does not currently have a PCP we will assist them in obtaining one.   The patient may need specialty follow up if the symptoms continue, in spite of conservative treatment and management, for further workup, evaluation, consultation and treatment as  clinically indicated and appropriate.   Patient/parent/caregiver verbalized understanding and agreement of plan as discussed.  All questions were addressed during visit.  Please see discharge instructions below for further details of plan.  Discharge Instructions:   Discharge Instructions      Your x-ray is concerning for a large stool burden  that needs to be passed.  Of sent a prescription for a medication called Linzess to your pharmacy.  Please take 1 capsule every 12 hours until you have had a meaningful bowel movement.  Once you feel that your bowels have been emptied, please continue taking Linzess once every day or every other day as needed to produce regular, meaningful bowel movements.  Please follow-up with your primary care provider regarding your blood pressure which is dangerously elevated during visit today.  Thank you for visiting urgent care.      This office note has been dictated using Museum/gallery curator.  Unfortunately, and despite my best efforts, this method of dictation can sometimes lead to occasional typographical or grammatical errors.  I apologize in advance if this occurs.     Lynden Oxford Scales, [place] 04/18/22 [phone]

## 2022-04-16 NOTE — Discharge Instructions (Addendum)
Your x-ray is concerning for a large stool burden that needs to be passed.  Of sent a prescription for a medication called Linzess to your pharmacy.  Please take 1 capsule every 12 hours until you have had a meaningful bowel movement.  Once you feel that your bowels have been emptied, please continue taking Linzess once every day or every other day as needed to produce regular, meaningful bowel movements.  Please follow-up with your primary care provider regarding your blood pressure which is dangerously elevated during visit today.  Thank you for visiting urgent care.

## 2022-04-18 ENCOUNTER — Other Ambulatory Visit (HOSPITAL_COMMUNITY): Payer: Self-pay

## 2022-04-18 ENCOUNTER — Other Ambulatory Visit: Payer: Self-pay | Admitting: Internal Medicine

## 2022-04-18 MED ORDER — FAMOTIDINE 20 MG PO TABS
20.0000 mg | ORAL_TABLET | Freq: Two times a day (BID) | ORAL | 2 refills | Status: AC
  Filled 2022-04-18: qty 30, 15d supply, fill #0
  Filled 2022-05-11: qty 30, 15d supply, fill #1

## 2022-05-11 ENCOUNTER — Other Ambulatory Visit (HOSPITAL_COMMUNITY): Payer: Self-pay

## 2023-02-11 ENCOUNTER — Emergency Department (HOSPITAL_COMMUNITY)
Admission: EM | Admit: 2023-02-11 | Discharge: 2023-02-11 | Disposition: A | Discharge status: Home / Self Care | Payer: Commercial Managed Care - PPO | Attending: Emergency Medicine | Admitting: Emergency Medicine

## 2023-02-11 ENCOUNTER — Encounter (HOSPITAL_COMMUNITY): Payer: Self-pay

## 2023-02-11 DIAGNOSIS — I10 Essential (primary) hypertension: Secondary | ICD-10-CM | POA: Insufficient documentation

## 2023-02-11 DIAGNOSIS — R040 Epistaxis: Secondary | ICD-10-CM

## 2023-02-11 MED ORDER — OXYMETAZOLINE HCL 0.05 % NA SOLN
1.0000 | Freq: Once | NASAL | Status: AC
  Administered 2023-02-11: 1 via NASAL
  Filled 2023-02-11: qty 30

## 2023-02-11 NOTE — ED Triage Notes (Signed)
Pt arrived POV for epistaxis that started last night while at work, had an employee use "packing" as insert which helped controlled the bleeding. Pt reports the bleeding restarted this morning. Pt denies HTN or takes blood thinners. HTN noted 181/107. A&O x4.

## 2023-02-11 NOTE — ED Notes (Signed)
Went to discharge pt and she has returned from the bathroom with nose profusely bleeding once more. Doctor notified

## 2023-02-11 NOTE — ED Provider Notes (Signed)
[place] EMERGENCY DEPARTMENT AT [facility] Provider Note   CSN: [id number] Arrival date & time: 02/11/23  0254     History  Chief Complaint  Patient presents with   Epistaxis    [name] is a 62 y.o. female.   Epistaxis Patient presents epistaxis.  Began at work last night.  Works in housekeeping at the hospital.  Came to the ER but was not seen because bleeding is stopped.  Started bleeding again this morning.  Appears to be bleeding on the right side.  No blood thinners.  Does have history of hypertension but is not on some of the medicines because she states she felt bad on it.    Past Medical History:  Diagnosis Date   Acute meniscal tear of left knee    Complication of anesthesia 5/10   severe sore throat    Fibroid    Numbness    left knee   OA (osteoarthritis) of knee    oa    Home Medications Prior to Admission medications   Medication Sig Start Date End Date Taking? Authorizing Provider  famotidine (PEPCID) 20 MG tablet Take 1 tablet (20 mg total) by mouth 2 (two) times daily. 04/18/22   Edsel Petrin, DO  fluticasone (FLONASE) 50 MCG/ACT nasal spray Place 2 sprays into both nostrils daily. 08/15/19   Olive Bass, FNP  ibuprofen (ADVIL) 200 MG tablet Take 200 mg by mouth every 6 (six) hours as needed.    [provider]  linaclotide Karlene Einstein) 145 MCG CAPS capsule Take 1 capsule (145 mcg total) by mouth daily before breakfast. 04/16/22 05/16/22  Theadora Rama Scales, PA-C      Allergies    Amlodipine, Losartan, and Tramadol    Review of Systems   Review of Systems  HENT:  Positive for nosebleeds.     Physical Exam Updated Vital Signs BP (!) 184/105   Pulse 64   Temp 98.2 F (36.8 C) (Oral)   Resp 17   Ht 5\' 3"  (1.6 m)   Wt 75.8 kg   SpO2 99%   BMI 29.58 kg/m  Physical Exam Vitals reviewed.  HENT:     Nose:     Comments: Active bleeding out of right nostril.  Red blood. Cardiovascular:     Rate and  Rhythm: Regular rhythm.  Skin:    General: Skin is warm.  Neurological:     Mental Status: She is alert.     ED Results / Procedures / Treatments   Labs (all labs ordered are listed, but only abnormal results are displayed) Labs Reviewed - No data to display  EKG None  Radiology No results found.  Procedures Procedures    Medications Ordered in ED Medications  oxymetazoline (AFRIN) 0.05 % nasal spray 1 spray (1 spray Each Nare Given 02/11/23 1026)    ED Course/ Medical Decision Making/ A&P                             Medical Decision Making Risk OTC drugs.   Patient with epistaxis.  Right side although does have some bleeding of the left side I think it is coming across.  Denies trauma.  Not on blood thinners.  Nasal balloon placed but did not tolerate well.  Will attempt to decrease pressure but potentially will require removal.  Patient did not tolerate the balloon.  Removed under patient request.  Was eager to go home  and bleeding is stopped, however prior to discharge had recurrence of bleeding.  Still right nostril but some on the left.  Attempted to place Merosel, however patient also did not tolerate this.  States she does not want anything else in her nose.  Will discuss with ENT to see if we can arrange short-term follow-up.  Also hypertensive here.  Has not had all her medicines.  Discussed with ENT on-call, Dr. Merril Abbe.  Patient can follow-up short-term if needed.  However patient now has stopped bleeding.  She is placed to cotton rolls in each of her nostrils and appears to be tolerating.  Discharge home.        Final Clinical Impression(s) / ED Diagnoses Final diagnoses:  Right-sided epistaxis    Rx / DC Orders ED Discharge Orders     None         Benjiman Core, MD 02/13/23 [phone]

## 2023-02-12 ENCOUNTER — Other Ambulatory Visit: Payer: Self-pay

## 2023-02-12 ENCOUNTER — Other Ambulatory Visit (HOSPITAL_COMMUNITY): Payer: Self-pay

## 2023-02-12 ENCOUNTER — Emergency Department (HOSPITAL_COMMUNITY)
Admission: EM | Admit: 2023-02-12 | Discharge: 2023-02-12 | Disposition: A | Discharge status: Home / Self Care | Payer: Commercial Managed Care - PPO | Attending: Emergency Medicine | Admitting: Emergency Medicine

## 2023-02-12 ENCOUNTER — Encounter (HOSPITAL_COMMUNITY): Payer: Self-pay

## 2023-02-12 DIAGNOSIS — I10 Essential (primary) hypertension: Secondary | ICD-10-CM | POA: Insufficient documentation

## 2023-02-12 DIAGNOSIS — R110 Nausea: Secondary | ICD-10-CM | POA: Diagnosis not present

## 2023-02-12 DIAGNOSIS — R040 Epistaxis: Secondary | ICD-10-CM | POA: Diagnosis not present

## 2023-02-12 DIAGNOSIS — Z79899 Other long term (current) drug therapy: Secondary | ICD-10-CM | POA: Diagnosis not present

## 2023-02-12 MED ORDER — LISINOPRIL 10 MG PO TABS
10.0000 mg | ORAL_TABLET | Freq: Every day | ORAL | 0 refills | Status: AC
  Filled 2023-02-12: qty 30, 30d supply, fill #0

## 2023-02-12 MED ORDER — ONDANSETRON 4 MG PO TBDP
4.0000 mg | ORAL_TABLET | Freq: Once | ORAL | Status: AC
  Administered 2023-02-12: 4 mg via ORAL
  Filled 2023-02-12: qty 1

## 2023-02-12 MED ORDER — TRANEXAMIC ACID FOR EPISTAXIS
500.0000 mg | Freq: Once | TOPICAL | Status: AC
  Administered 2023-02-12: 500 mg via TOPICAL
  Filled 2023-02-12 (×2): qty 10

## 2023-02-12 MED ORDER — ONDANSETRON 4 MG PO TBDP
ORAL_TABLET | ORAL | Status: AC
  Filled 2023-02-12: qty 1

## 2023-02-12 MED ORDER — ONDANSETRON 4 MG PO TBDP
4.0000 mg | ORAL_TABLET | Freq: Once | ORAL | Status: AC
Start: 2023-02-12 — End: 2023-02-12
  Administered 2023-02-12: 4 mg via ORAL
  Filled 2023-02-12: qty 1

## 2023-02-12 MED ORDER — OXYMETAZOLINE HCL 0.05 % NA SOLN
1.0000 | Freq: Once | NASAL | Status: AC
  Administered 2023-02-12: 1 via NASAL
  Filled 2023-02-12: qty 30

## 2023-02-12 MED ORDER — LISINOPRIL 10 MG PO TABS
10.0000 mg | ORAL_TABLET | Freq: Once | ORAL | Status: DC
  Filled 2023-02-12: qty 1

## 2023-02-12 NOTE — ED Provider Notes (Signed)
[place] EMERGENCY DEPARTMENT AT [facility] Provider Note   CSN: [id number] Arrival date & time: 02/12/23  0051     History  Chief Complaint  Patient presents with   Epistaxis    [name] is a 62 y.o. female.   Epistaxis  Patient is a 62 year old female presented to the emergency room today with complaints of right-sided epistaxis.  Seems that she has had intermittent bleeding from right nostril since Wednesday morning.  No trauma to her face.  She is not on any anticoagulation.  She does not take any aspirin or DOAC  She has not had any trauma or facial surgeries in the past.  She states that she has no other symptoms apart from some mild nausea from swallowing drooling.  She denies any shortness of breath lightheadedness or dizziness.      Home Medications Prior to Admission medications   Medication Sig Start Date End Date Taking? Authorizing Provider  lisinopril (ZESTRIL) 10 MG tablet Take 1 tablet (10 mg total) by mouth daily. 02/12/23  Yes Kemet Nijjar S, PA  famotidine (PEPCID) 20 MG tablet Take 1 tablet (20 mg total) by mouth 2 (two) times daily. 04/18/22   Edsel Petrin, DO  fluticasone (FLONASE) 50 MCG/ACT nasal spray Place 2 sprays into both nostrils daily. 08/15/19   Olive Bass, FNP  ibuprofen (ADVIL) 200 MG tablet Take 200 mg by mouth every 6 (six) hours as needed.    [provider]  linaclotide Karlene Einstein) 145 MCG CAPS capsule Take 1 capsule (145 mcg total) by mouth daily before breakfast. 04/16/22 05/16/22  Theadora Rama Scales, PA-C      Allergies    Amlodipine, Losartan, and Tramadol    Review of Systems   Review of Systems  HENT:  Positive for nosebleeds.     Physical Exam Updated Vital Signs BP (!) 153/97   Pulse 68   Temp (!) 97.5 F (36.4 C) (Oral)   Resp 20   Ht 5\' 3"  (1.6 m)   Wt 75 kg   SpO2 98%   BMI 29.29 kg/m  Physical Exam Vitals and nursing note reviewed.  Constitutional:      General: She  is not in acute distress. HENT:     Head: Normocephalic and atraumatic.     Nose: Nose normal.     Comments: R sided epistaxis     Mouth/Throat:     Mouth: Mucous membranes are moist.  Eyes:     General: No scleral icterus. Cardiovascular:     Rate and Rhythm: Normal rate and regular rhythm.     Pulses: Normal pulses.     Heart sounds: Normal heart sounds.  Pulmonary:     Effort: Pulmonary effort is normal. No respiratory distress.  Musculoskeletal:     Cervical back: Normal range of motion.     Right lower leg: No edema.     Left lower leg: No edema.  Skin:    General: Skin is warm and dry.     Capillary Refill: Capillary refill takes less than 2 seconds.  Neurological:     Mental Status: She is alert. Mental status is at baseline.  Psychiatric:        Mood and Affect: Mood normal.        Behavior: Behavior normal.     ED Results / Procedures / Treatments   Labs (all labs ordered are listed, but only abnormal results are displayed) Labs Reviewed - No data to display  EKG None  Radiology No results found.  Procedures Procedures    Medications Ordered in ED Medications  lisinopril (ZESTRIL) tablet 10 mg (10 mg Oral Not Given 02/12/23 0407)  oxymetazoline (AFRIN) 0.05 % nasal spray 1 spray (1 spray Each Nare Given 02/12/23 0108)  ondansetron (ZOFRAN-ODT) disintegrating tablet 4 mg (4 mg Oral Given 02/12/23 0147)  tranexamic acid (CYKLOKAPRON) 1000 MG/10ML topical solution 500 mg (500 mg Topical Given 02/12/23 0225)  ondansetron (ZOFRAN-ODT) disintegrating tablet 4 mg (4 mg Oral Given 02/12/23 0228)    ED Course/ Medical Decision Making/ A&P Clinical Course as of 02/12/23 0602  Fri Feb 12, 2023  0115 Patient seen in triage and requested RN staff.  She is oozing blood from right nare.  Not on any anticoagulation no history of facial trauma and no history of facial surgery.  She has had intermittent bleeding from the right nostril since Wednesday. [WF]    Clinical Course  User Index [WF] Gailen Shelter, PA                             Medical Decision Making Risk OTC drugs. Prescription drug management.   Patient is a 62 year old female presented to the emergency room today with complaints of right-sided epistaxis.  Seems that she has had intermittent bleeding from right nostril since Wednesday morning.  No trauma to her face.  She is not on any anticoagulation.  She does not take any aspirin or DOAC  She has not had any trauma or facial surgeries in the past.  She states that she has no other symptoms apart from some mild nausea from swallowing drooling.  She denies any shortness of breath lightheadedness or dizziness.  Topical TXA, Afrin soaked pledgets applied to right nostril with pressure clamp.  Bleeding stopped completely. \ Patient did have recurrence of bleeding very slow ooze of blood from right nare.  I reevaluated patient and reapplied fresh pledget.  She has normal vital signs apart from mild hypertension I will represcribe her her the lisinopril she has been prescribed in the past.  Follow-up with ENT bleeding has completely stopped.  Will discharge home at this time  Final Clinical Impression(s) / ED Diagnoses Final diagnoses:  Epistaxis  Hypertension, unspecified type    Rx / DC Orders ED Discharge Orders          Ordered    lisinopril (ZESTRIL) 10 MG tablet  Daily        02/12/23 0403              Gailen Shelter, PA 02/12/23 0602    Sloan Leiter, DO 02/12/23 [phone]

## 2023-02-12 NOTE — ED Notes (Signed)
Patient given discharge instructions and follow up care. Patient verbalized understanding. Patient ambulatory out of ED. 

## 2023-02-12 NOTE — Discharge Instructions (Addendum)
Please take your lisinopril I have prescribed this for you (you've had this in the past).   If you have another bleed, blow your nose, I had placed a Afrin soaked pledget here and placed

## 2023-02-12 NOTE — ED Triage Notes (Signed)
Patient reports she was just here for epistaxis and discharged. Reports bleeding started again tonight.

## 2023-02-16 DIAGNOSIS — R040 Epistaxis: Secondary | ICD-10-CM | POA: Diagnosis not present

## 2023-02-19 ENCOUNTER — Other Ambulatory Visit (HOSPITAL_COMMUNITY): Payer: Self-pay

## 2023-04-20 ENCOUNTER — Ambulatory Visit: Payer: Commercial Managed Care - PPO | Admitting: Student

## 2023-08-16 ENCOUNTER — Ambulatory Visit: Payer: Commercial Managed Care - PPO | Admitting: Student

## 2023-10-26 ENCOUNTER — Encounter: Payer: Self-pay | Admitting: Acute Care

## 2023-12-23 ENCOUNTER — Encounter (HOSPITAL_COMMUNITY): Payer: Self-pay | Admitting: Emergency Medicine

## 2023-12-23 ENCOUNTER — Emergency Department (HOSPITAL_COMMUNITY)
Admission: EM | Admit: 2023-12-23 | Discharge: 2023-12-23 | Disposition: A | Discharge status: Home / Self Care | Attending: Emergency Medicine | Admitting: Emergency Medicine

## 2023-12-23 ENCOUNTER — Emergency Department (HOSPITAL_COMMUNITY)

## 2023-12-23 ENCOUNTER — Other Ambulatory Visit: Payer: Self-pay

## 2023-12-23 DIAGNOSIS — Z90710 Acquired absence of both cervix and uterus: Secondary | ICD-10-CM | POA: Diagnosis not present

## 2023-12-23 DIAGNOSIS — M545 Low back pain, unspecified: Secondary | ICD-10-CM | POA: Diagnosis not present

## 2023-12-23 DIAGNOSIS — R109 Unspecified abdominal pain: Secondary | ICD-10-CM | POA: Diagnosis not present

## 2023-12-23 DIAGNOSIS — R197 Diarrhea, unspecified: Secondary | ICD-10-CM | POA: Diagnosis not present

## 2023-12-23 DIAGNOSIS — E876 Hypokalemia: Secondary | ICD-10-CM | POA: Diagnosis not present

## 2023-12-23 DIAGNOSIS — K529 Noninfective gastroenteritis and colitis, unspecified: Secondary | ICD-10-CM | POA: Diagnosis not present

## 2023-12-23 DIAGNOSIS — F17200 Nicotine dependence, unspecified, uncomplicated: Secondary | ICD-10-CM | POA: Diagnosis not present

## 2023-12-23 DIAGNOSIS — R519 Headache, unspecified: Secondary | ICD-10-CM | POA: Diagnosis not present

## 2023-12-23 DIAGNOSIS — R7401 Elevation of levels of liver transaminase levels: Secondary | ICD-10-CM | POA: Diagnosis not present

## 2023-12-23 LAB — URINALYSIS, ROUTINE W REFLEX MICROSCOPIC
Glucose, UA: NEGATIVE mg/dL
Hgb urine dipstick: NEGATIVE
Ketones, ur: NEGATIVE mg/dL
Leukocytes,Ua: NEGATIVE
Nitrite: NEGATIVE
Protein, ur: 100 mg/dL — AB
Specific Gravity, Urine: 1.035 — ABNORMAL HIGH (ref 1.005–1.030)
pH: 5 (ref 5.0–8.0)

## 2023-12-23 LAB — COMPREHENSIVE METABOLIC PANEL
ALT: 151 U/L — ABNORMAL HIGH (ref 0–44)
AST: 509 U/L — ABNORMAL HIGH (ref 15–41)
Albumin: 4.1 g/dL (ref 3.5–5.0)
Alkaline Phosphatase: 86 U/L (ref 38–126)
Anion gap: 12 (ref 5–15)
BUN: 16 mg/dL (ref 8–23)
CO2: 24 mmol/L (ref 22–32)
Calcium: 9.2 mg/dL (ref 8.9–10.3)
Chloride: 103 mmol/L (ref 98–111)
Creatinine, Ser: 0.99 mg/dL (ref 0.44–1.00)
GFR, Estimated: >60 mL/min (ref >60)
Glucose, Bld: 86 mg/dL (ref 70–99)
Potassium: 2.8 mmol/L — ABNORMAL LOW (ref 3.5–5.1)
Sodium: 139 mmol/L (ref 135–145)
Total Bilirubin: 0.8 mg/dL (ref 0.0–1.2)
Total Protein: 7.7 g/dL (ref 6.5–8.1)

## 2023-12-23 LAB — CBC
HCT: 40.2 % (ref 36.0–46.0)
Hemoglobin: 13.8 g/dL (ref 12.0–15.0)
MCH: 35.8 pg — ABNORMAL HIGH (ref 26.0–34.0)
MCHC: 34.3 g/dL (ref 30.0–36.0)
MCV: 104.1 fL — ABNORMAL HIGH (ref 80.0–100.0)
Platelets: 191 10*3/uL (ref 150–400)
RBC: 3.86 MIL/uL — ABNORMAL LOW (ref 3.87–5.11)
RDW: 12.6 % (ref 11.5–15.5)
WBC: 4.7 10*3/uL (ref 4.0–10.5)
nRBC: 0 % (ref 0.0–0.2)

## 2023-12-23 LAB — MAGNESIUM: Magnesium: 2 mg/dL (ref 1.7–2.4)

## 2023-12-23 LAB — LIPASE, BLOOD: Lipase: 30 U/L (ref 11–51)

## 2023-12-23 MED ORDER — POTASSIUM CHLORIDE CRYS ER 20 MEQ PO TBCR
40.0000 meq | EXTENDED_RELEASE_TABLET | Freq: Once | ORAL | Status: AC
  Administered 2023-12-23: 40 meq via ORAL
  Filled 2023-12-23: qty 2

## 2023-12-23 MED ORDER — SODIUM CHLORIDE 0.9 % IV BOLUS
1000.0000 mL | Freq: Once | INTRAVENOUS | Status: AC
  Administered 2023-12-23: 1000 mL via INTRAVENOUS

## 2023-12-23 MED ORDER — ONDANSETRON HCL 4 MG/2ML IJ SOLN
4.0000 mg | Freq: Once | INTRAMUSCULAR | Status: AC
  Administered 2023-12-23: 4 mg via INTRAVENOUS
  Filled 2023-12-23: qty 2

## 2023-12-23 MED ORDER — POTASSIUM CHLORIDE CRYS ER 20 MEQ PO TBCR
20.0000 meq | EXTENDED_RELEASE_TABLET | Freq: Two times a day (BID) | ORAL | 0 refills | Status: AC
  Filled 2023-12-23: qty 6, 3d supply, fill #0

## 2023-12-23 MED ORDER — IOHEXOL 300 MG/ML  SOLN
100.0000 mL | Freq: Once | INTRAMUSCULAR | Status: AC | PRN
  Administered 2023-12-23: 100 mL via INTRAVENOUS

## 2023-12-23 MED ORDER — POTASSIUM CHLORIDE 10 MEQ/100ML IV SOLN
10.0000 meq | INTRAVENOUS | Status: AC
  Administered 2023-12-23: 10 meq via INTRAVENOUS
  Filled 2023-12-23 (×2): qty 100

## 2023-12-23 NOTE — ED Provider Notes (Signed)
 Patient given in sign out by Ardelle Park.  Please review their note for patient HPI, physical exam, workup.  At this time the plan is follow-up on imaging and if negative can discharge.  CT was negative.  I went to evaluate the patient patient adamantly denies any emesis and states she has just had diarrhea since eating at the hospital cafeteria think she had food poisoning.  Patient states she would like to be discharged since her CT is negative and like to follow-up on the results on MyChart.  Patient been able to tolerate water here and so was passed p.o. challenge.  I recommend patient tries a brat diet along with Imodium and follow-up with a primary care provider along with GI given her transaminitis.  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  Will give a few days worth of potassium pills as well.  Patient given return precautions.  Patient stable to be discharged.  Patient verbalized understanding acceptance of this plan.    Remi Deter 12/23/23 2302    Linwood Dibbles, MD 12/23/23 [phone]

## 2023-12-23 NOTE — Discharge Instructions (Signed)
 Please follow-up with the primary care provider and GI specialist have attached your for you today.  Today your labs show your potassium is low and I given you few days with potassium pills.  In terms of your diarrhea you may try Imodium over-the-counter along with a brat diet which includes bananas, rice, applesauce, toast.  Please remain hydrated and follow-up.  Please also follow-up on the MyChart app in regards to your hepatitis panel as well.  If symptoms change or worsen please return to the ER.

## 2023-12-23 NOTE — ED Provider Notes (Signed)
 [facility] Provider Note   CSN: [id number] Arrival date & time: 12/23/23  1552     History  Chief Complaint  Patient presents with   Emesis    AUBREI BOUCHIE is a 63 y.o. female.  The history is provided by the patient and medical records. No language interpreter was used.  Emesis    63 yo female presenting for persistent diarrhea that began early Wednesday morning. Patient tried taking Pepto and Alkaseltzer without symptom relief. Patient has immediate diarrhea every time she eats or drinks for the past two days. Patient endorses chills. Patient first went to UC today where she was given Toradol injection for pain, but was sent here for concern of electrolyte imbalances.   Home Medications Prior to Admission medications   Medication Sig Start Date End Date Taking? Authorizing Provider  famotidine (PEPCID) 20 MG tablet Take 1 tablet (20 mg total) by mouth 2 (two) times daily. 04/18/22   Edsel Petrin, DO  fluticasone (FLONASE) 50 MCG/ACT nasal spray Place 2 sprays into both nostrils daily. 08/15/19   Olive Bass, FNP  ibuprofen (ADVIL) 200 MG tablet Take 200 mg by mouth every 6 (six) hours as needed.    [provider]  linaclotide Karlene Einstein) 145 MCG CAPS capsule Take 1 capsule (145 mcg total) by mouth daily before breakfast. 04/16/22 05/16/22  Theadora Rama Scales, PA-C  lisinopril (ZESTRIL) 10 MG tablet Take 1 tablet (10 mg total) by mouth daily. 02/12/23   Gailen Shelter, PA      Allergies    Amlodipine, Losartan, and Tramadol    Review of Systems   Review of Systems  Gastrointestinal:  Positive for vomiting.  All other systems reviewed and are negative.   Physical Exam Updated Vital Signs BP (!) 154/100 (BP Location: Right Arm)   Pulse 76   Temp 98.5 F (36.9 C) (Oral)   Resp 18   SpO2 100%  Physical Exam Vitals and nursing note reviewed.  Constitutional:      General: She is not in acute  distress.    Appearance: She is well-developed.  HENT:     Head: Atraumatic.  Eyes:     Conjunctiva/sclera: Conjunctivae normal.  Cardiovascular:     Rate and Rhythm: Normal rate and regular rhythm.     Pulses: Normal pulses.     Heart sounds: Normal heart sounds.  Pulmonary:     Effort: Pulmonary effort is normal.  Abdominal:     Palpations: Abdomen is soft.     Tenderness: There is abdominal tenderness (Mild generalized abdominal tenderness no guarding or rebound tenderness).  Musculoskeletal:     Cervical back: Neck supple.  Skin:    Findings: No rash.  Neurological:     Mental Status: She is alert.  Psychiatric:        Mood and Affect: Mood normal.     ED Results / Procedures / Treatments   Labs (all labs ordered are listed, but only abnormal results are displayed) Labs Reviewed  COMPREHENSIVE METABOLIC PANEL - Abnormal; Notable for the following components:      Result Value   Potassium 2.8 (*)    AST 509 (*)    ALT 151 (*)    All other components within normal limits  CBC - Abnormal; Notable for the following components:   RBC 3.86 (*)    MCV 104.1 (*)    MCH 35.8 (*)    All other components within normal  limits  URINALYSIS, ROUTINE W REFLEX MICROSCOPIC - Abnormal; Notable for the following components:   Color, Urine AMBER (*)    APPearance HAZY (*)    Specific Gravity, Urine 1.035 (*)    Bilirubin Urine SMALL (*)    Protein, ur 100 (*)    Bacteria, UA RARE (*)    All other components within normal limits  LIPASE, BLOOD  HEPATITIS PANEL, ACUTE    EKG None  Radiology No results found.  Procedures Procedures    Medications Ordered in ED Medications  potassium chloride 10 mEq in 100 mL IVPB ( Intravenous Restarted 12/23/23 1940)  sodium chloride 0.9 % bolus 1,000 mL (1,000 mLs Intravenous Bolus 12/23/23 1939)  potassium chloride SA (KLOR-CON M) CR tablet 40 mEq (40 mEq Oral Given 12/23/23 1940)  ondansetron (ZOFRAN) injection 4 mg (4 mg Intravenous  Given 12/23/23 1941)  iohexol (OMNIPAQUE) 300 MG/ML solution 100 mL (100 mLs Intravenous Contrast Given 12/23/23 2014)    ED Course/ Medical Decision Making/ A&P                                 Medical Decision Making Amount and/or Complexity of Data Reviewed Labs: ordered. Radiology: ordered.  Risk Prescription drug management.   BP (!) 154/100 (BP Location: Right Arm)   Pulse 76   Temp 98.5 F (36.9 C) (Oral)   Resp 18   SpO2 100%   79:25 PM 63 year old female presenting with complaints of abdominal discomfort.  Patient mention after eating her meal several days ago she subsequently developed persistent diarrhea.  States it seems like everything goes straight through.  She endorsed watery diarrhea without any blood.  She endorsed nausea without vomiting.  She endorsed mild abdominal cramping.  No fever or chills no urinary discomfort no chest pain or shortness of breath.  She was initially seen in urgent care center but was sent here for further assessment.  She denies any heavy Tylenol use.  She drinks wine cooler on occasion but denies heavy alcohol use.  She denies any recent antibiotic use.  On exam, patient in no acute discomfort.  She has a mild generalized abdominal tenderness without any guarding rebound tenderness.  No jaundice.  Skin with normal skin turgor.  Vital signs without fever no hypoxia, no tachycardia.  -Labs ordered, independently viewed and interpreted by me.  Labs remarkable for potassium of 2.8, will give supplementation.  Evidence of transaminitis with AST 509, ALT 151. -The patient was maintained on a cardiac monitor.  I personally viewed and interpreted the cardiac monitored which showed an underlying rhythm of: NSR -Imaging including abd/pelvis CT ordered but have not resulted yet -This patient presents to the ED for concern of abd pain, this involves an extensive number of treatment options, and is a complaint that carries with it a high risk of  complications and morbidity.  The differential diagnosis includes viral GI, colitis, diverticulitis, appendicitis, pancreatitis, cholecystitis, hepatitis -Co morbidities that complicate the patient evaluation includes obesity, smoker -Treatment includes ivf, zofran, potassium -Reevaluation of the patient after these medicines showed that the patient improved -PCP office notes or outside notes reviewed -Discussion with oncoming team who will f/u on abd/pelvis CT as well as hepatitis panel and determine disposition.   -Escalation to admission/observation considered: dispo pending  Currently awaiting abdominal pelvis CT and some further evaluation.  Patient received potassium supplementation as well as IV fluid and antinausea medication.  Hepatitis panel  is currently pending.  I suspect her transaminitis may be due to hepatitis A.  I am less concern for alcohol induced hepatitis or Tylenol overdose.          Final Clinical Impression(s) / ED Diagnoses Final diagnoses:  None    Rx / DC Orders ED Discharge Orders     None         Fayrene Helper, Cordelia Poche 12/23/23 2207    Linwood Dibbles, MD 12/23/23 [phone]

## 2023-12-23 NOTE — ED Triage Notes (Signed)
 Patient presents from urgent care due to nausea and vomiting since this past Tuesday. Urgent care sent her here for labs and concern for dehydration.

## 2023-12-24 ENCOUNTER — Other Ambulatory Visit (HOSPITAL_COMMUNITY): Payer: Self-pay

## 2023-12-24 LAB — HEPATITIS PANEL, ACUTE
HCV Ab: NONREACTIVE
Hep A IgM: NONREACTIVE
Hep B C IgM: NONREACTIVE
Hepatitis B Surface Ag: NONREACTIVE

## 2024-01-03 ENCOUNTER — Encounter: Payer: Self-pay | Admitting: Family Medicine

## 2024-01-04 ENCOUNTER — Other Ambulatory Visit (HOSPITAL_COMMUNITY): Payer: Self-pay

## 2024-01-20 ENCOUNTER — Other Ambulatory Visit (HOSPITAL_COMMUNITY): Payer: Self-pay

## 2024-03-06 ENCOUNTER — Ambulatory Visit: Admitting: Family Medicine

## 2024-04-06 ENCOUNTER — Other Ambulatory Visit: Payer: Self-pay

## 2024-05-09 NOTE — Progress Notes (Signed)
 This encounter was created in error - please disregard.

## 2024-07-27 ENCOUNTER — Other Ambulatory Visit: Payer: Self-pay
